# Patient Record
Sex: Female | Born: 1935 | Race: Black or African American | Hispanic: No | Marital: Single | Smoking: Never smoker
Health system: Southern US, Community
[De-identification: ages and names within clinical notes are randomized; demographics above are authoritative.]

## PROBLEM LIST (undated history)

## (undated) DIAGNOSIS — E7800 Pure hypercholesterolemia, unspecified: Secondary | ICD-10-CM

## (undated) DIAGNOSIS — G4730 Sleep apnea, unspecified: Secondary | ICD-10-CM

## (undated) DIAGNOSIS — I10 Essential (primary) hypertension: Secondary | ICD-10-CM

## (undated) DIAGNOSIS — K219 Gastro-esophageal reflux disease without esophagitis: Secondary | ICD-10-CM

## (undated) DIAGNOSIS — Z9289 Personal history of other medical treatment: Secondary | ICD-10-CM

## (undated) DIAGNOSIS — I8290 Acute embolism and thrombosis of unspecified vein: Secondary | ICD-10-CM

## (undated) HISTORY — PX: TUBAL LIGATION: SHX77

## (undated) HISTORY — DX: Personal history of other medical treatment: Z92.89

## (undated) HISTORY — DX: Sleep apnea, unspecified: G47.30

## (undated) HISTORY — PX: ABDOMINAL HYSTERECTOMY: SHX81

## (undated) HISTORY — PX: NM MYOVIEW LTD: HXRAD82

---

## 1997-10-13 ENCOUNTER — Ambulatory Visit (HOSPITAL_COMMUNITY): Admission: RE | Admit: 1997-10-13 | Discharge: 1997-10-13 | Payer: Self-pay | Admitting: Cardiology

## 1997-10-20 ENCOUNTER — Ambulatory Visit (HOSPITAL_COMMUNITY): Admission: RE | Admit: 1997-10-20 | Discharge: 1997-10-20 | Payer: Self-pay | Admitting: Cardiology

## 1999-06-13 ENCOUNTER — Ambulatory Visit (HOSPITAL_COMMUNITY): Admission: RE | Admit: 1999-06-13 | Discharge: 1999-06-13 | Payer: Self-pay

## 1999-09-04 ENCOUNTER — Ambulatory Visit (HOSPITAL_COMMUNITY): Admission: RE | Admit: 1999-09-04 | Discharge: 1999-09-04 | Payer: Self-pay | Admitting: Gastroenterology

## 1999-10-22 ENCOUNTER — Other Ambulatory Visit: Admission: RE | Admit: 1999-10-22 | Discharge: 1999-10-22 | Payer: Self-pay | Admitting: Internal Medicine

## 2003-01-27 ENCOUNTER — Ambulatory Visit (HOSPITAL_COMMUNITY): Admission: RE | Admit: 2003-01-27 | Discharge: 2003-01-27 | Payer: Self-pay | Admitting: Cardiology

## 2003-01-27 ENCOUNTER — Encounter: Payer: Self-pay | Admitting: Cardiology

## 2003-01-27 HISTORY — PX: CARDIAC CATHETERIZATION: SHX172

## 2005-01-16 ENCOUNTER — Ambulatory Visit (HOSPITAL_COMMUNITY): Admission: RE | Admit: 2005-01-16 | Discharge: 2005-01-16 | Payer: Self-pay | Admitting: Gastroenterology

## 2005-01-16 ENCOUNTER — Encounter (INDEPENDENT_AMBULATORY_CARE_PROVIDER_SITE_OTHER): Payer: Self-pay | Admitting: *Deleted

## 2006-07-19 ENCOUNTER — Emergency Department (HOSPITAL_COMMUNITY): Admission: EM | Admit: 2006-07-19 | Discharge: 2006-07-19 | Payer: Self-pay | Admitting: Family Medicine

## 2008-12-14 ENCOUNTER — Encounter: Admission: RE | Admit: 2008-12-14 | Discharge: 2008-12-14 | Payer: Self-pay | Admitting: Internal Medicine

## 2009-07-22 ENCOUNTER — Emergency Department (HOSPITAL_COMMUNITY): Admission: EM | Admit: 2009-07-22 | Discharge: 2009-07-22 | Payer: Self-pay | Admitting: Emergency Medicine

## 2009-09-05 DIAGNOSIS — Z9289 Personal history of other medical treatment: Secondary | ICD-10-CM

## 2009-09-05 HISTORY — DX: Personal history of other medical treatment: Z92.89

## 2009-10-16 ENCOUNTER — Ambulatory Visit (HOSPITAL_COMMUNITY): Admission: RE | Admit: 2009-10-16 | Discharge: 2009-10-16 | Payer: Self-pay | Admitting: Cardiovascular Disease

## 2009-11-28 ENCOUNTER — Encounter (INDEPENDENT_AMBULATORY_CARE_PROVIDER_SITE_OTHER): Payer: Self-pay | Admitting: Emergency Medicine

## 2009-11-28 ENCOUNTER — Observation Stay (HOSPITAL_COMMUNITY): Admission: EM | Admit: 2009-11-28 | Discharge: 2009-11-30 | Payer: Self-pay | Admitting: Emergency Medicine

## 2009-11-29 ENCOUNTER — Ambulatory Visit: Payer: Self-pay | Admitting: Vascular Surgery

## 2010-01-17 ENCOUNTER — Ambulatory Visit (HOSPITAL_COMMUNITY): Admission: RE | Admit: 2010-01-17 | Discharge: 2010-01-17 | Payer: Self-pay | Admitting: Cardiovascular Disease

## 2010-07-19 LAB — BLOOD GAS, ARTERIAL
Acid-Base Excess: 0.8 mmol/L (ref 0.0–2.0)
Bicarbonate: 24.3 mEq/L — ABNORMAL HIGH (ref 20.0–24.0)
Drawn by: 276051
FIO2: 0.21 %
TCO2: 25.4 mmol/L (ref 0–100)
pCO2 arterial: 35 mmHg (ref 35.0–45.0)
pO2, Arterial: 85.7 mmHg (ref 80.0–100.0)

## 2010-07-21 LAB — CBC
HCT: 32 % — ABNORMAL LOW (ref 36.0–46.0)
HCT: 33.6 % — ABNORMAL LOW (ref 36.0–46.0)
Hemoglobin: 10.8 g/dL — ABNORMAL LOW (ref 12.0–15.0)
Hemoglobin: 10.9 g/dL — ABNORMAL LOW (ref 12.0–15.0)
Hemoglobin: 11.6 g/dL — ABNORMAL LOW (ref 12.0–15.0)
MCH: 31.2 pg (ref 26.0–34.0)
MCHC: 34 g/dL (ref 30.0–36.0)
MCV: 90.4 fL (ref 78.0–100.0)
MCV: 91.9 fL (ref 78.0–100.0)
MCV: 93.2 fL (ref 78.0–100.0)
Platelets: 235 10*3/uL (ref 150–400)
RBC: 3.46 MIL/uL — ABNORMAL LOW (ref 3.87–5.11)
RBC: 3.72 MIL/uL — ABNORMAL LOW (ref 3.87–5.11)
RDW: 15.4 % (ref 11.5–15.5)
RDW: 15.5 % (ref 11.5–15.5)

## 2010-07-21 LAB — SEDIMENTATION RATE: Sed Rate: 65 mm/hr — ABNORMAL HIGH (ref 0–22)

## 2010-07-21 LAB — URINALYSIS, ROUTINE W REFLEX MICROSCOPIC
Bilirubin Urine: NEGATIVE
Hgb urine dipstick: NEGATIVE
Ketones, ur: NEGATIVE mg/dL
Nitrite: NEGATIVE
pH: 7.5 (ref 5.0–8.0)

## 2010-07-21 LAB — COMPREHENSIVE METABOLIC PANEL
CO2: 23 mEq/L (ref 19–32)
GFR calc Af Amer: >60 mL/min (ref >60)

## 2010-07-21 LAB — BASIC METABOLIC PANEL
BUN: 10 mg/dL (ref 6–23)
CO2: 25 mEq/L (ref 19–32)
Glucose, Bld: 99 mg/dL (ref 70–99)
Sodium: 139 mEq/L (ref 135–145)

## 2010-07-21 LAB — POCT CARDIAC MARKERS
CKMB, poc: <1 ng/mL — ABNORMAL LOW (ref 1.0–8.0)
Troponin i, poc: <0.05 ng/mL (ref 0.00–0.09)

## 2010-07-21 LAB — DIFFERENTIAL
Basophils Absolute: 0.1 10*3/uL (ref 0.0–0.1)
Monocytes Absolute: 0.7 10*3/uL (ref 0.1–1.0)

## 2010-07-30 LAB — COMPREHENSIVE METABOLIC PANEL
ALT: 18 U/L (ref 0–35)
Alkaline Phosphatase: 34 U/L — ABNORMAL LOW (ref 39–117)
CO2: 25 mEq/L (ref 19–32)
Chloride: 109 mEq/L (ref 96–112)
GFR calc Af Amer: >60 mL/min (ref >60)
GFR calc non Af Amer: 55 mL/min — ABNORMAL LOW (ref >60)
Total Bilirubin: 0.6 mg/dL (ref 0.3–1.2)

## 2010-07-30 LAB — DIFFERENTIAL
Basophils Relative: 0 % (ref 0–1)
Eosinophils Relative: 4 % (ref 0–5)
Lymphocytes Relative: 26 % (ref 12–46)
Lymphs Abs: 1.5 10*3/uL (ref 0.7–4.0)
Monocytes Absolute: 0.6 10*3/uL (ref 0.1–1.0)
Neutrophils Relative %: 60 % (ref 43–77)

## 2010-07-30 LAB — POCT CARDIAC MARKERS
CKMB, poc: 1.1 ng/mL (ref 1.0–8.0)
CKMB, poc: <1 ng/mL — ABNORMAL LOW (ref 1.0–8.0)
Myoglobin, poc: 89.9 ng/mL (ref 12–200)

## 2010-07-30 LAB — CBC
Hemoglobin: 11 g/dL — ABNORMAL LOW (ref 12.0–15.0)
MCHC: 33 g/dL (ref 30.0–36.0)
MCV: 93.3 fL (ref 78.0–100.0)
Platelets: 200 10*3/uL (ref 150–400)
RBC: 3.58 MIL/uL — ABNORMAL LOW (ref 3.87–5.11)

## 2010-09-21 NOTE — Procedures (Signed)
[place]  Patient:    [name], [name]                        MRN: [id number] Proc. Date: 09/04/99 Adm. Date:  [id number] Attending:  Charna Elizabeth CC:         Merlene Laughter. Renae Gloss, M.D.                           Procedure Report  DATE OF BIRTH: 1931-04-05  REFERRING PHYSICIAN: Merlene Laughter. Renae Gloss, M.D.  PROCEDURE: Colonoscopy.  ENDOSCOPIST: Anselmo Rod, M.D.  INSTRUMENTS USED: Olympus video colonoscope.  INDICATIONS FOR PROCEDURE: History of adenomatous polyps in a 77 year old black female; rule out recurrent polyps.  PREPROCEDURE PREPARATION: Informed consent was procured from the patient.  The patient fasted for eight hours prior to the procedure and prepped with a bottle of magnesium citrate and a gallon of NuLytely the night prior to the procedure.  PREPROCEDURE PHYSICAL:  VITAL SIGNS: The patient had stable vital signs.  NECK: Supple.  CHEST: Clear to auscultation.  CARDIAC: S1 and S2, regular.  ABDOMEN: Soft, with normal abdominal bowel sounds.  DESCRIPTION OF PROCEDURE: The patient was placed in the left lateral decubitus position and sedated with 50 mg of Demerol and 5 mg of Versed intravenously. Once the patient was adequately sedated, maintained on low-flow oxygen, continuous cardiac monitoring, the Olympus video colonoscope was advanced from the rectum to the cecum without difficulty.  There was left-sided diverticulosis.  No masses or polyps were seen.  The cecum, right colon, and transverse colon appeared healthy.  There was some residual stool in the colon and a very small lesion may have been missed.  IMPRESSION:  1. Left-sided diverticulosis.  2. No large masses or polyps seen.  3. Normal appearing cecum, right colon, and transverse colon.  RECOMMENDATIONS:  1. Patient advised to increase fluid and fiber in her diet.  2. Repeat colonoscopy recommended in five years or earlier if the patient     should  develop any symptoms in the interim.  3. Outpatient follow-up is advised as-needed. DD:  09/04/99 TD:  09/05/99 Job: [id number]

## 2010-09-21 NOTE — Op Note (Signed)
[name], [name]                 ACCOUNT NO.:  [id number]   MEDICAL RECORD NO.:  [id number]          PATIENT TYPE:  AMB   LOCATION:  ENDO                         FACILITY:  [facility]   PHYSICIAN:  Anselmo Rod, M.D.  DATE OF BIRTH:  1931-02-08   DATE OF PROCEDURE:  01/16/2005  DATE OF DISCHARGE:                                 OPERATIVE REPORT   PROCEDURE PERFORMED:  Colonoscopy with snare polypectomy x one and cold  biopsy x one.   ENDOSCOPIST:  Charna Elizabeth, M.D.   INSTRUMENT USED:  Olympus video colonoscope.   INDICATIONS FOR PROCEDURE:  The patient is a 76 year old African-American  female with a personal history of adenomatous polyps undergoing a repeat  colonoscopy to rule out recurrent polyps.   PREPROCEDURE PREPARATION:  Informed consent was procured from the patient.  The patient was fasted for eight hours prior to the procedure and prepped  with a bottle of magnesium citrate and a gallon of GoLYTELY the night prior  to the procedure.  The risks and benefits of the procedure including a 10%  miss rate for colon polyps or cancers was discussed with the patient as  well.   PREPROCEDURE PHYSICAL:  The patient had stable vital signs.  Neck supple.  Chest clear to auscultation.  S1 and S2 regular.  No murmurs, rubs or  gallops, rhonchi or wheezing.  Abdomen soft with normal bowel sounds.   DESCRIPTION OF PROCEDURE:  The patient was placed in left lateral decubitus  position and sedated with 70 mg of Demerol and 7.5 mg of Versed in slow  incremental doses.  Once the patient was adequately sedated and maintained  on low flow oxygen and continuous cardiac monitoring, the Olympus video  colonoscope was advanced from the rectum to the cecum.  The appendicular  orifice and ileocecal valve were clearly visualized and photographed.  A  small sessile polyp was biopsied (cold biopsy times one) from the mid right  colon.  Another larger flat polyp was removed by hot snare from the  distal  right colon.  They were both placed in the same specimen bottle.  There was  evidence of sigmoid diverticulosis.  Retroflexion in the rectum revealed  small internal hemorrhoids.  The patient tolerated the procedure well  without immediate complication.   IMPRESSION:  1.  Small nonbleeding internal hemorrhoids.  2.  Two polyps removed from the right colon, one by hot snare and one by      cold biopsy.  3.  Normal-appearing cecum and transverse colon.   RECOMMENDATIONS:  1.  Await pathology results.  2.  Repeat colonoscopy depending on pathology results.  3.  Brochures on diverticulosis have been given to the patient for her      education.  4.  Outpatient followup as need arises in the future.  If the patient has      any abnormal symptoms prior to her scheduled follow-up colonoscopy, she      is to contact the office immediately.      Anselmo Rod, M.D.  Electronically Signed  JNM/MEDQ  D:  01/16/2005  T:  01/16/2005  Job:  [id number]   cc:   Merlene Laughter. Renae Gloss, M.D.  [address]  [address]  [place]  [place] [address]  Fax: [phone]

## 2010-09-21 NOTE — Cardiovascular Report (Signed)
NAME:  [name], [name]                           ACCOUNT NO.:  [id number]   MEDICAL RECORD NO.:  [id number]                   PATIENT TYPE:  OIB   LOCATION:  2889                                 FACILITY:  [facility]   PHYSICIAN:  Madaline Savage, M.D.             DATE OF BIRTH:  16-Jun-1930   DATE OF PROCEDURE:  01/27/2003  DATE OF DISCHARGE:                              CARDIAC CATHETERIZATION   PROCEDURES PERFORMED:  1. Combined left heart catheterization.     a. Selective coronary angiography by Judkins technique.     b. Retrograde left heart catheterization.     c. Left ventricular angiography.  2. Successful right femoral arteriotomy with the Angio-Seal device.   COMPLICATIONS:  None.   ENTRY SITE:  Right femoral.   DYE USED:  Omnipaque.   PATIENT PROFILE:  [name] is a delightful 77 year old recently widowed  African-American woman who has had hypertension for some time and recently  has had mid chest tightness mostly at rest and not with exertion.  A recent  Cardiolite stress test showed anterior wall thinning and mild anterolateral  and lateral wall ischemia toward the apex with left ventricular ejection  fraction of 79.  We discussed the results with the patient and she agreed to  an outpatient cardiac catheterization.   RESULTS:   PRESSURES:  The left ventricular  pressure was 190/90 with a mean pressure  of 130.  Left ventricular pressure was 190/10, end-diastolic pressure 22.   ANGIOGRAPHIC RESULTS:  1. The left main coronary artery was normal.  2. The left anterior descending coronary artery gave rise to one septal     perforator branch and a medium size diagonal branch that arose near the     first septal perforator branch.  3. The left circumflex is nondominant giving rise to one intermediate ramus     branch and then a pair of posterolateral branches.  4. The right coronary artery was a dominant vessel with no lesions seen.  5. Left ventricle showed good  contractility.  Ejection fraction estimate of     60%.  No wall motion abnormality seen.  No mitral regurgitation.   FINAL DIAGNOSES:  1. Angiographically patent and normal coronary arteries.  2. Normal left ventricular systolic function.  3. Normal abdominal aorta.  4. Normal renal arteries.   ADDENDUM:  I had failed to mention that the patient also had an abdominal  aortogram showing normal abdominal aorta and also normal renal arteries.  This was done because the patient has a history of hypertension.                                                 Madaline Savage, M.D.    WHG/MEDQ  D:  01/27/2003  T:  01/27/2003  Job:  [id number]   cc:   Merlene Laughter. Renae Gloss, M.D.  [address]  [address]  [place]  [place] [address]  Fax: [phone]

## 2011-04-15 ENCOUNTER — Encounter (HOSPITAL_BASED_OUTPATIENT_CLINIC_OR_DEPARTMENT_OTHER): Payer: No Typology Code available for payment source | Attending: Internal Medicine

## 2011-04-15 DIAGNOSIS — E785 Hyperlipidemia, unspecified: Secondary | ICD-10-CM | POA: Insufficient documentation

## 2011-04-15 DIAGNOSIS — Z79899 Other long term (current) drug therapy: Secondary | ICD-10-CM | POA: Insufficient documentation

## 2011-04-15 DIAGNOSIS — Z7982 Long term (current) use of aspirin: Secondary | ICD-10-CM | POA: Insufficient documentation

## 2011-04-15 DIAGNOSIS — L97309 Non-pressure chronic ulcer of unspecified ankle with unspecified severity: Secondary | ICD-10-CM | POA: Insufficient documentation

## 2011-04-15 DIAGNOSIS — I872 Venous insufficiency (chronic) (peripheral): Secondary | ICD-10-CM | POA: Insufficient documentation

## 2011-04-15 DIAGNOSIS — I10 Essential (primary) hypertension: Secondary | ICD-10-CM | POA: Insufficient documentation

## 2011-04-16 NOTE — Progress Notes (Unsigned)
[facility]  NAME:  [name], [name]                 ACCOUNT NO.:  [id number]  MEDICAL RECORD NO.:  [id number]      DATE OF BIRTH:  08-Sep-1930  PHYSICIAN:  Jonelle Sports. Sevier, M.D.       VISIT DATE:                                  OFFICE VISIT   HISTORY:  This 77 year old black female is seen for evaluation of an ulcer of relatively short duration in the "gaiter" area of her right ankle.  The patient has a history of thrombophlebitis in that right lower extremity a number of years ago and since that time, he has developed really bilateral staining consistent with stasis dermatitis, somewhat worse in the right lower extremity than in the left.  She had not had prior ulcerations, however, until late September of this year when she developed an open ulceration in the medial supramalleolar area of her right ankle, which has persisted to the present with progressive enlargement and sort of a fibrous nondraining, nonodorous base.  There has been no pain.  There has been no surrounding erythema.  No drainage or odor and no systemic symptoms.  She was referred here for our evaluation and advice.  PAST MEDICAL HISTORY:  Surgically noted for total abdominal hysterectomy done a number of years ago.  This was done for fibroids and apparently her ovaries were spared.  She has had no other surgery.  She has had hospitalizations for diverticulitis and thrombophlebitis.  ALLERGIES:  She has no known medicinal allergies.  MEDICATIONS:  Her regular medications include: 1. Bactrim DS b.i.d. 2. Coenzyme Q10 of 200 mg daily. 3. Diovan HCT 160/25 one daily. 4. Zantac 150 mg b.i.d. 5. Vicodin 5/500 one q.4-6 h. as needed. 6. Aspirin 81 mg daily.  PERSONAL HISTORY:  The patient is widowed, has a son who lives with her. She does not smoke, drink alcohol, or use any recreational drugs.  She is not currently employed.  She is able to manage her daily needs without  assistance.  FAMILY HISTORY:  Not available.  Apparently is noted for longevity.  REVIEW OF SYSTEMS:  The patient has no history of stroke or heart disease.  She is hypertensive and also has hyperlipidemia.  She does not have diabetes; has no awareness of any kidney disorder; has never had malignancy, radiation, or chemotherapy.  She has no bleeding disorders, no anemia, and no thyroid disease or recent unexplained weight change.  PHYSICAL EXAMINATION:  VITAL SIGNS:  Blood pressure is 121/64, pulse 72 and regular, respirations 18, temperature 97.9. GENERAL:  This is a pleasant, alert, and cooperative elderly black female, in no immediate distress. SKIN:  Warm and dry with good texture and turgor. HEENT:  Her mucous membranes are moist and with good color. HEART:  Regular with no murmur or gallop. LUNGS:  Clear to auscultation. ABDOMEN:  Palpated only but no tenderness, apparent masses, or organomegaly. EXTREMITIES:  2+ edema on the right, 1+ on the left.  Chronic stasis changes extending to the high tibial areas bilaterally, slightly worse on the right than on the left.  Her pulses are everywhere palpable and appear quite adequate.  Her toenails are overgrown and curling. In the supramalleolar area, on the medial aspect of the right lower extremity, there is an  open ulcer measuring 3.4 x 3.2 cm with a very fibrous and adherent slough in its base.  IMPRESSION:  Chronic venous insufficiency, importantly secondary to prior thrombophlebitis, now with stasis ulceration, right medial supramalleolar area.  DISPOSITION:  The patient has an appointment already scheduled to see Cardiovascular Clinic for appropriate venous studies and hopefully consideration of obliteration.  We pointed out that we likely would be in agreement with such recommendation, and we leave it to their judgment as to whether or not she wants to wait until her wound is healed.  The wound was debrided using a curette  and interestingly we have more success in removing part of this fibrinous exudate than I would have guessed.  Nonetheless, a considerable amount remains.  The wound is then treated with an application of hydrogel and Santyl covered with a hydrocolloid and that extremity is placed in a Kerlix/Coban wrap.  She will ask the Cardiovascular Clinic to share their findings with [place], and we have asked that they do arterial studies as well as venous.  The patient is given the name of Dr. Larey Dresser, a podiatrist, to consult for care of her nails now and for routine ongoing podiatric care.  Followup visit will be here in 1 week.          ______________________________ Jonelle Sports. Cheryll Cockayne, M.D.     RES/MEDQ  D:  04/15/2011  T:  04/16/2011  Job:  [id number]

## 2011-05-13 ENCOUNTER — Encounter (HOSPITAL_BASED_OUTPATIENT_CLINIC_OR_DEPARTMENT_OTHER): Payer: No Typology Code available for payment source | Attending: Internal Medicine

## 2011-05-13 DIAGNOSIS — I10 Essential (primary) hypertension: Secondary | ICD-10-CM | POA: Insufficient documentation

## 2011-05-13 DIAGNOSIS — Z79899 Other long term (current) drug therapy: Secondary | ICD-10-CM | POA: Insufficient documentation

## 2011-05-13 DIAGNOSIS — M899 Disorder of bone, unspecified: Secondary | ICD-10-CM | POA: Insufficient documentation

## 2011-05-13 DIAGNOSIS — L97309 Non-pressure chronic ulcer of unspecified ankle with unspecified severity: Secondary | ICD-10-CM | POA: Insufficient documentation

## 2011-05-13 DIAGNOSIS — I872 Venous insufficiency (chronic) (peripheral): Secondary | ICD-10-CM | POA: Insufficient documentation

## 2011-05-20 ENCOUNTER — Encounter (HOSPITAL_BASED_OUTPATIENT_CLINIC_OR_DEPARTMENT_OTHER): Payer: Medicare Other

## 2011-06-10 ENCOUNTER — Encounter (HOSPITAL_BASED_OUTPATIENT_CLINIC_OR_DEPARTMENT_OTHER): Payer: No Typology Code available for payment source | Attending: Internal Medicine

## 2011-06-10 DIAGNOSIS — Z90710 Acquired absence of both cervix and uterus: Secondary | ICD-10-CM | POA: Insufficient documentation

## 2011-06-10 DIAGNOSIS — M899 Disorder of bone, unspecified: Secondary | ICD-10-CM | POA: Insufficient documentation

## 2011-06-10 DIAGNOSIS — I872 Venous insufficiency (chronic) (peripheral): Secondary | ICD-10-CM | POA: Insufficient documentation

## 2011-06-10 DIAGNOSIS — Z79899 Other long term (current) drug therapy: Secondary | ICD-10-CM | POA: Insufficient documentation

## 2011-06-10 DIAGNOSIS — Z7982 Long term (current) use of aspirin: Secondary | ICD-10-CM | POA: Insufficient documentation

## 2011-06-10 DIAGNOSIS — L97809 Non-pressure chronic ulcer of other part of unspecified lower leg with unspecified severity: Secondary | ICD-10-CM | POA: Insufficient documentation

## 2011-06-10 DIAGNOSIS — I10 Essential (primary) hypertension: Secondary | ICD-10-CM | POA: Insufficient documentation

## 2011-06-10 DIAGNOSIS — E785 Hyperlipidemia, unspecified: Secondary | ICD-10-CM | POA: Insufficient documentation

## 2011-07-08 ENCOUNTER — Encounter (HOSPITAL_BASED_OUTPATIENT_CLINIC_OR_DEPARTMENT_OTHER): Payer: No Typology Code available for payment source | Attending: Internal Medicine

## 2011-07-08 DIAGNOSIS — E785 Hyperlipidemia, unspecified: Secondary | ICD-10-CM | POA: Insufficient documentation

## 2011-07-08 DIAGNOSIS — L97809 Non-pressure chronic ulcer of other part of unspecified lower leg with unspecified severity: Secondary | ICD-10-CM | POA: Insufficient documentation

## 2011-07-08 DIAGNOSIS — M899 Disorder of bone, unspecified: Secondary | ICD-10-CM | POA: Insufficient documentation

## 2011-07-08 DIAGNOSIS — I10 Essential (primary) hypertension: Secondary | ICD-10-CM | POA: Insufficient documentation

## 2011-07-08 DIAGNOSIS — I872 Venous insufficiency (chronic) (peripheral): Secondary | ICD-10-CM | POA: Insufficient documentation

## 2011-07-08 DIAGNOSIS — Z79899 Other long term (current) drug therapy: Secondary | ICD-10-CM | POA: Insufficient documentation

## 2011-07-08 DIAGNOSIS — Z7982 Long term (current) use of aspirin: Secondary | ICD-10-CM | POA: Insufficient documentation

## 2011-07-08 DIAGNOSIS — Z90710 Acquired absence of both cervix and uterus: Secondary | ICD-10-CM | POA: Insufficient documentation

## 2011-08-05 ENCOUNTER — Encounter (HOSPITAL_BASED_OUTPATIENT_CLINIC_OR_DEPARTMENT_OTHER): Payer: No Typology Code available for payment source | Attending: Internal Medicine

## 2011-08-05 DIAGNOSIS — I872 Venous insufficiency (chronic) (peripheral): Secondary | ICD-10-CM | POA: Insufficient documentation

## 2011-08-05 DIAGNOSIS — L97809 Non-pressure chronic ulcer of other part of unspecified lower leg with unspecified severity: Secondary | ICD-10-CM | POA: Insufficient documentation

## 2011-09-09 ENCOUNTER — Encounter (HOSPITAL_BASED_OUTPATIENT_CLINIC_OR_DEPARTMENT_OTHER): Payer: Medicare Other | Attending: Internal Medicine

## 2011-09-09 DIAGNOSIS — L97809 Non-pressure chronic ulcer of other part of unspecified lower leg with unspecified severity: Secondary | ICD-10-CM | POA: Insufficient documentation

## 2011-09-09 DIAGNOSIS — I872 Venous insufficiency (chronic) (peripheral): Secondary | ICD-10-CM | POA: Insufficient documentation

## 2011-11-27 ENCOUNTER — Encounter (HOSPITAL_BASED_OUTPATIENT_CLINIC_OR_DEPARTMENT_OTHER): Payer: No Typology Code available for payment source | Attending: General Surgery

## 2011-11-27 DIAGNOSIS — I872 Venous insufficiency (chronic) (peripheral): Secondary | ICD-10-CM | POA: Insufficient documentation

## 2011-11-27 DIAGNOSIS — I10 Essential (primary) hypertension: Secondary | ICD-10-CM | POA: Insufficient documentation

## 2011-11-27 DIAGNOSIS — I83009 Varicose veins of unspecified lower extremity with ulcer of unspecified site: Secondary | ICD-10-CM | POA: Insufficient documentation

## 2011-11-27 DIAGNOSIS — Z79899 Other long term (current) drug therapy: Secondary | ICD-10-CM | POA: Insufficient documentation

## 2011-11-27 DIAGNOSIS — E785 Hyperlipidemia, unspecified: Secondary | ICD-10-CM | POA: Insufficient documentation

## 2011-11-27 DIAGNOSIS — L97909 Non-pressure chronic ulcer of unspecified part of unspecified lower leg with unspecified severity: Secondary | ICD-10-CM | POA: Insufficient documentation

## 2011-11-27 NOTE — H&P (Signed)
[name], [name]                 ACCOUNT NO.:  [id number]  MEDICAL RECORD NO.:  [id number]  LOCATION:  FOOT                         FACILITY:  [facility]  PHYSICIAN:  Joanne Gavel, M.D.        DATE OF BIRTH:  1930/11/11  DATE OF ADMISSION:  11/27/2011 DATE OF DISCHARGE:                             HISTORY & PHYSICAL   CHIEF COMPLAINT:  Wound, right leg.  HISTORY OF PRESENT ILLNESS:  This is a 78 year old female with varicose and venous insufficiency.  She had a sizable ulceration at the right medial malleolus, which was treated and healed out on August 12, 2011. This remained healed for 2-3 months, but approximately 2 weeks ago, she noticed a spontaneous breakdown in the same location.  There has been no fever, chills, or sweating spells.  The wound is not particularly painful.  There has been no trauma.  PAST MEDICAL HISTORY:  She has had diverticulosis, fibroid uterus, hypertension, hyperlipidemia, thrombophlebitis, and history of venous insufficiency.  PAST SURGICAL HISTORY:  She has had a skin graft in the past and hysterectomy.  SOCIAL HISTORY:  Cigarettes none.  Alcohol none.  MEDICATIONS:  Diovan and Zantac, several different enzymes and nutritional supplements and vitamins.  ALLERGIES:  None.  REVIEW OF SYSTEMS:  Essentially as above.  PHYSICAL EXAMINATION:  VITAL SIGNS: Temperature 98.2, pulse 60, respirations 18, blood pressure 138/79. GENERAL:  Well developed, well nourished, in no distress. HEAD, EYES, EARS, THROAT: Normal. CHEST:  Clear. HEART:  Regular rhythm. ABDOMEN: Not examined. EXTREMITIES:  Peripheral pulses are palpable.  There is a 4.0 x 4.6 x 4.6 cm full-thickness wound above the right medial malleolus.  This is approximately 0.1 cm in depth.  The base is covered with a thick slough, which is curetted off.  The base does remain shaggy, however.  IMPRESSION:  Recurrent varicose-venous ulcer.  PLAN OF TREATMENT:  Start with Santyl and Hydrogel, Coban 2.   We will see her in 7 days.     Joanne Gavel, M.D.     RA/MEDQ  D:  11/27/2011  T:  11/27/2011  Job:  [id number]

## 2011-12-11 ENCOUNTER — Encounter (HOSPITAL_BASED_OUTPATIENT_CLINIC_OR_DEPARTMENT_OTHER): Payer: No Typology Code available for payment source | Attending: General Surgery

## 2011-12-11 DIAGNOSIS — L97809 Non-pressure chronic ulcer of other part of unspecified lower leg with unspecified severity: Secondary | ICD-10-CM | POA: Insufficient documentation

## 2011-12-11 DIAGNOSIS — I872 Venous insufficiency (chronic) (peripheral): Secondary | ICD-10-CM | POA: Insufficient documentation

## 2012-01-01 ENCOUNTER — Encounter (HOSPITAL_BASED_OUTPATIENT_CLINIC_OR_DEPARTMENT_OTHER): Payer: Medicare Other

## 2012-01-08 ENCOUNTER — Encounter (HOSPITAL_BASED_OUTPATIENT_CLINIC_OR_DEPARTMENT_OTHER): Payer: No Typology Code available for payment source | Attending: General Surgery

## 2012-01-08 DIAGNOSIS — L97309 Non-pressure chronic ulcer of unspecified ankle with unspecified severity: Secondary | ICD-10-CM | POA: Insufficient documentation

## 2012-01-08 DIAGNOSIS — I872 Venous insufficiency (chronic) (peripheral): Secondary | ICD-10-CM | POA: Insufficient documentation

## 2012-02-05 ENCOUNTER — Encounter (HOSPITAL_BASED_OUTPATIENT_CLINIC_OR_DEPARTMENT_OTHER): Payer: No Typology Code available for payment source | Attending: General Surgery

## 2012-02-05 DIAGNOSIS — L97309 Non-pressure chronic ulcer of unspecified ankle with unspecified severity: Secondary | ICD-10-CM | POA: Insufficient documentation

## 2012-03-11 ENCOUNTER — Encounter (HOSPITAL_BASED_OUTPATIENT_CLINIC_OR_DEPARTMENT_OTHER): Payer: No Typology Code available for payment source | Attending: General Surgery

## 2012-03-11 DIAGNOSIS — L97909 Non-pressure chronic ulcer of unspecified part of unspecified lower leg with unspecified severity: Secondary | ICD-10-CM | POA: Insufficient documentation

## 2012-03-11 DIAGNOSIS — I87319 Chronic venous hypertension (idiopathic) with ulcer of unspecified lower extremity: Secondary | ICD-10-CM | POA: Insufficient documentation

## 2012-04-08 ENCOUNTER — Encounter (HOSPITAL_BASED_OUTPATIENT_CLINIC_OR_DEPARTMENT_OTHER): Payer: Medicare Other

## 2012-04-08 ENCOUNTER — Encounter (HOSPITAL_BASED_OUTPATIENT_CLINIC_OR_DEPARTMENT_OTHER): Payer: No Typology Code available for payment source | Attending: General Surgery

## 2012-04-08 DIAGNOSIS — L97909 Non-pressure chronic ulcer of unspecified part of unspecified lower leg with unspecified severity: Secondary | ICD-10-CM | POA: Insufficient documentation

## 2012-05-13 ENCOUNTER — Encounter (HOSPITAL_BASED_OUTPATIENT_CLINIC_OR_DEPARTMENT_OTHER): Payer: Medicare Other | Attending: General Surgery

## 2012-05-13 DIAGNOSIS — L97909 Non-pressure chronic ulcer of unspecified part of unspecified lower leg with unspecified severity: Secondary | ICD-10-CM | POA: Insufficient documentation

## 2012-06-10 ENCOUNTER — Encounter (HOSPITAL_BASED_OUTPATIENT_CLINIC_OR_DEPARTMENT_OTHER): Payer: Medicare Other | Attending: General Surgery

## 2012-06-10 DIAGNOSIS — I87319 Chronic venous hypertension (idiopathic) with ulcer of unspecified lower extremity: Secondary | ICD-10-CM | POA: Insufficient documentation

## 2012-06-10 DIAGNOSIS — L97909 Non-pressure chronic ulcer of unspecified part of unspecified lower leg with unspecified severity: Secondary | ICD-10-CM | POA: Insufficient documentation

## 2012-06-17 ENCOUNTER — Encounter (HOSPITAL_BASED_OUTPATIENT_CLINIC_OR_DEPARTMENT_OTHER): Payer: No Typology Code available for payment source

## 2012-07-08 ENCOUNTER — Encounter (HOSPITAL_BASED_OUTPATIENT_CLINIC_OR_DEPARTMENT_OTHER): Payer: Medicare Other | Attending: General Surgery

## 2012-07-08 DIAGNOSIS — L97809 Non-pressure chronic ulcer of other part of unspecified lower leg with unspecified severity: Secondary | ICD-10-CM | POA: Insufficient documentation

## 2012-07-08 DIAGNOSIS — I872 Venous insufficiency (chronic) (peripheral): Secondary | ICD-10-CM | POA: Insufficient documentation

## 2012-07-27 ENCOUNTER — Other Ambulatory Visit (HOSPITAL_COMMUNITY): Payer: Self-pay | Admitting: Cardiovascular Disease

## 2012-07-29 ENCOUNTER — Other Ambulatory Visit (HOSPITAL_COMMUNITY): Payer: Self-pay | Admitting: Cardiovascular Disease

## 2012-07-30 ENCOUNTER — Other Ambulatory Visit (HOSPITAL_COMMUNITY): Payer: Self-pay | Admitting: Cardiology

## 2012-08-05 ENCOUNTER — Encounter (HOSPITAL_BASED_OUTPATIENT_CLINIC_OR_DEPARTMENT_OTHER): Payer: No Typology Code available for payment source | Attending: General Surgery

## 2012-08-05 DIAGNOSIS — I87309 Chronic venous hypertension (idiopathic) without complications of unspecified lower extremity: Secondary | ICD-10-CM | POA: Insufficient documentation

## 2012-08-05 DIAGNOSIS — L97809 Non-pressure chronic ulcer of other part of unspecified lower leg with unspecified severity: Secondary | ICD-10-CM | POA: Insufficient documentation

## 2012-08-05 DIAGNOSIS — I872 Venous insufficiency (chronic) (peripheral): Secondary | ICD-10-CM | POA: Insufficient documentation

## 2012-08-14 ENCOUNTER — Ambulatory Visit (HOSPITAL_COMMUNITY)
Admission: RE | Admit: 2012-08-14 | Discharge: 2012-08-14 | Disposition: A | Discharge status: Home / Self Care | Payer: Medicare Other | Source: Ambulatory Visit | Attending: Cardiovascular Disease | Admitting: Cardiovascular Disease

## 2012-08-14 DIAGNOSIS — L97109 Non-pressure chronic ulcer of unspecified thigh with unspecified severity: Secondary | ICD-10-CM | POA: Insufficient documentation

## 2012-08-14 NOTE — Progress Notes (Signed)
Arterial Duplex Lower Ext. Completed. [name] D  

## 2012-09-22 ENCOUNTER — Other Ambulatory Visit: Payer: Self-pay | Admitting: Internal Medicine

## 2012-09-22 ENCOUNTER — Other Ambulatory Visit: Payer: Self-pay | Admitting: Nurse Practitioner

## 2012-09-22 DIAGNOSIS — M79662 Pain in left lower leg: Secondary | ICD-10-CM

## 2012-09-23 ENCOUNTER — Ambulatory Visit
Admission: RE | Admit: 2012-09-23 | Discharge: 2012-09-23 | Disposition: A | Discharge status: Home / Self Care | Payer: Medicare Other | Source: Ambulatory Visit | Attending: Internal Medicine | Admitting: Internal Medicine

## 2012-09-23 DIAGNOSIS — M79662 Pain in left lower leg: Secondary | ICD-10-CM

## 2014-06-13 ENCOUNTER — Emergency Department (HOSPITAL_COMMUNITY)
Admission: EM | Admit: 2014-06-13 | Discharge: 2014-06-13 | Disposition: A | Discharge status: Home / Self Care | Payer: Medicare Other | Attending: Emergency Medicine | Admitting: Emergency Medicine

## 2014-06-13 ENCOUNTER — Encounter (HOSPITAL_COMMUNITY): Payer: Self-pay | Admitting: Emergency Medicine

## 2014-06-13 DIAGNOSIS — I10 Essential (primary) hypertension: Secondary | ICD-10-CM | POA: Diagnosis not present

## 2014-06-13 DIAGNOSIS — E780 Pure hypercholesterolemia: Secondary | ICD-10-CM | POA: Insufficient documentation

## 2014-06-13 DIAGNOSIS — Z79899 Other long term (current) drug therapy: Secondary | ICD-10-CM | POA: Diagnosis not present

## 2014-06-13 DIAGNOSIS — K219 Gastro-esophageal reflux disease without esophagitis: Secondary | ICD-10-CM | POA: Diagnosis not present

## 2014-06-13 DIAGNOSIS — M25561 Pain in right knee: Secondary | ICD-10-CM | POA: Insufficient documentation

## 2014-06-13 DIAGNOSIS — M79604 Pain in right leg: Secondary | ICD-10-CM | POA: Diagnosis present

## 2014-06-13 DIAGNOSIS — Z7982 Long term (current) use of aspirin: Secondary | ICD-10-CM | POA: Diagnosis not present

## 2014-06-13 DIAGNOSIS — M25562 Pain in left knee: Secondary | ICD-10-CM | POA: Diagnosis not present

## 2014-06-13 HISTORY — DX: Acute embolism and thrombosis of unspecified vein: I82.90

## 2014-06-13 HISTORY — DX: Gastro-esophageal reflux disease without esophagitis: K21.9

## 2014-06-13 HISTORY — DX: Essential (primary) hypertension: I10

## 2014-06-13 HISTORY — DX: Pure hypercholesterolemia, unspecified: E78.00

## 2014-06-13 MED ORDER — OXYCODONE HCL 5 MG PO TABS
5.0000 mg | ORAL_TABLET | ORAL | Status: AC
  Administered 2014-06-13: 5 mg via ORAL
  Filled 2014-06-13: qty 1

## 2014-06-13 MED ORDER — OXYCODONE-ACETAMINOPHEN 5-325 MG PO TABS
1.0000 | ORAL_TABLET | Freq: Four times a day (QID) | ORAL | Status: DC | PRN
Start: 2014-06-13 — End: 2015-11-02

## 2014-06-13 MED ORDER — ACETAMINOPHEN 325 MG PO TABS
650.0000 mg | ORAL_TABLET | Freq: Once | ORAL | Status: AC
  Administered 2014-06-13: 650 mg via ORAL
  Filled 2014-06-13: qty 2

## 2014-06-13 NOTE — ED Provider Notes (Signed)
CSN: [id number]     Arrival date & time 06/13/14  1250 History   First MD Initiated Contact with Patient 06/13/14 1535     Chief Complaint  Patient presents with  . Leg Pain     (Consider location/radiation/quality/duration/timing/severity/associated sxs/prior Treatment) Patient is a 81 y.o. female presenting with leg pain. The history is provided by the patient.  Leg Pain Location:  Knee Time since incident:  3 days Injury: no   Knee location:  L knee and R knee Pain details:    Quality:  Aching   Radiates to:  R leg and L leg   Severity:  Moderate   Onset quality:  Gradual   Duration:  3 days   Timing:  Constant   Progression:  Worsening Chronicity:  Chronic Dislocation: no   Foreign body present:  No foreign bodies Prior injury to area:  No Relieved by:  Nothing Worsened by:  Activity and bearing weight Ineffective treatments:  NSAIDs Associated symptoms: no back pain, no fatigue, no fever and no neck pain     Past Medical History  Diagnosis Date  . Blood clot in vein   . Hypertension   . High cholesterol   . GERD (gastroesophageal reflux disease)    Past Surgical History  Procedure Laterality Date  . Tubal ligation     History reviewed. No pertinent family history. History  Substance Use Topics  . Smoking status: Not on file  . Smokeless tobacco: Not on file  . Alcohol Use: Not on file   OB History    No data available     Review of Systems  Constitutional: Negative for fever and fatigue.  HENT: Negative for congestion and drooling.   Eyes: Negative for pain.  Respiratory: Negative for cough and shortness of breath.   Cardiovascular: Negative for chest pain.  Gastrointestinal: Negative for nausea, vomiting, abdominal pain and diarrhea.  Genitourinary: Negative for dysuria and hematuria.  Musculoskeletal: Negative for back pain, gait problem and neck pain.  Skin: Negative for color change.  Neurological: Negative for dizziness and headaches.   Hematological: Negative for adenopathy.  Psychiatric/Behavioral: Negative for behavioral problems.  All other systems reviewed and are negative.     Allergies  Review of patient's allergies indicates no known allergies.  Home Medications   Prior to Admission medications   Medication Sig Start Date End Date Taking? Authorizing Provider  acetaminophen (TYLENOL) 500 MG tablet Take 1,000 mg by mouth every 6 (six) hours as needed (for pain).   Yes Historical Provider, MD  aspirin EC 81 MG tablet Take 81 mg by mouth daily with breakfast.   Yes Historical Provider, MD  Multiple Vitamins-Minerals (CENTRUM SILVER ADULT 50+) TABS Take 1 tablet by mouth daily with breakfast.   Yes Historical Provider, MD  pravastatin (PRAVACHOL) 40 MG tablet Take 40 mg by mouth daily with breakfast.   Yes Historical Provider, MD  ranitidine (ZANTAC) 150 MG tablet Take 150 mg by mouth daily with breakfast.   Yes Historical Provider, MD  valsartan-hydrochlorothiazide (DIOVAN-HCT) 160-25 MG per tablet Take 1 tablet by mouth daily with breakfast.   Yes Historical Provider, MD   BP 125/53 mmHg  Pulse 68  Temp(Src) 98.3 F (36.8 C) (Oral)  Resp 18  SpO2 97% Physical Exam  Constitutional: She is oriented to person, place, and time. She appears well-developed and well-nourished.  HENT:  Head: Normocephalic.  Mouth/Throat: Oropharynx is clear and moist. No oropharyngeal exudate.  Eyes: Conjunctivae and EOM are normal. Pupils are  equal, round, and reactive to light.  Neck: Normal range of motion. Neck supple.  Cardiovascular: Normal rate, regular rhythm, normal heart sounds and intact distal pulses.  Exam reveals no gallop and no friction rub.   No murmur heard. Pulmonary/Chest: Effort normal and breath sounds normal. No respiratory distress. She has no wheezes.  Abdominal: Soft. Bowel sounds are normal. There is no tenderness. There is no rebound and no guarding.  Musculoskeletal: Normal range of motion. She  exhibits tenderness. She exhibits no edema.  Diffuse nonspecific tenderness to palpation of bilateral anterior knees. Mildly limited range of motion of the knees bilaterally due to pain.  Superficially the knees appear normal without evidence of erythema or swelling. They appear symmetric.  2+ distal DP pulses in bilateral lower extremities.  No tenderness to palpation of the hips bilaterally.  Neurological: She is alert and oriented to person, place, and time.  Skin: Skin is warm and dry.  Psychiatric: She has a normal mood and affect. Her behavior is normal.  Nursing note and vitals reviewed.   ED Course  Procedures (including critical care time) Labs Review Labs Reviewed - No data to display  Imaging Review No results found.   EKG Interpretation None      MDM   Final diagnoses:  Bilateral knee pain    4:11 PM 81 y.o. female who presents with bilateral knee pain which worsened over the last 2-3 days. She denies any injuries. She states that she has a long history of chronic knee pain. She also states that she has a history of blood clots in the past but is not on anticoagulation. She is afebrile and vital signs are unremarkable here. She has no tenderness to palpation of her thighs or calves. Her pain is localized around the knees bilaterally and is worse with range of motion of the knees. I suspect this is acute worsening of chronic osteoarthritis. She is tried over-the-counter medications without significant relief. Will get pain control and ambulate her here. I do not think this is a blood clot. I suspect she needs a formal evaluation by an orthopedist.  5:58 PM: Pain controlled and patient ambulatory. She believes she has seen a physician with [facility] orth[facility] and will have her follow-up with their group for evaluation. I have discussed the diagnosis/risks/treatment options with the patient and family and believe the pt to be eligible for discharge home to follow-up  with [facility]. We also discussed returning to the ED immediately if new or worsening sx occur. We discussed the sx which are most concerning (e.g., worsening pain, fever) that necessitate immediate return. Medications administered to the patient during their visit and any new prescriptions provided to the patient are listed below.  Medications given during this visit Medications  acetaminophen (TYLENOL) tablet 650 mg (650 mg Oral Given 06/13/14 1638)  oxyCODONE (Oxy IR/ROXICODONE) immediate release tablet 5 mg (5 mg Oral Given 06/13/14 1638)    New Prescriptions   OXYCODONE-ACETAMINOPHEN (PERCOCET) 5-325 MG PER TABLET    Take 1 tablet by mouth every 6 (six) hours as needed for moderate pain.     Purvis Sheffield, MD 06/13/14 1759

## 2014-06-13 NOTE — ED Notes (Signed)
Bilateral lower leg pain going on for the past 3 days. 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

## 2014-06-13 NOTE — ED Notes (Signed)
Bed: [facility] Expected date:  Expected time:  Means of arrival:  Comments: Elderly leg pain

## 2014-06-13 NOTE — ED Notes (Signed)
Pt was able to ambulate with some assistance from staff to steady herself. She complains of a great deal of pain when bending her knees and feels "unsure of her knees".

## 2014-09-01 ENCOUNTER — Other Ambulatory Visit: Payer: Self-pay | Admitting: Internal Medicine

## 2014-09-01 DIAGNOSIS — M79662 Pain in left lower leg: Secondary | ICD-10-CM

## 2014-09-02 ENCOUNTER — Other Ambulatory Visit: Payer: Self-pay | Admitting: Internal Medicine

## 2014-09-02 ENCOUNTER — Ambulatory Visit
Admission: RE | Admit: 2014-09-02 | Discharge: 2014-09-02 | Disposition: A | Discharge status: Home / Self Care | Payer: Medicare Other | Source: Ambulatory Visit | Attending: Internal Medicine | Admitting: Internal Medicine

## 2014-09-02 DIAGNOSIS — M79662 Pain in left lower leg: Secondary | ICD-10-CM

## 2014-11-15 ENCOUNTER — Encounter: Payer: Self-pay | Admitting: *Deleted

## 2014-12-15 ENCOUNTER — Emergency Department (HOSPITAL_BASED_OUTPATIENT_CLINIC_OR_DEPARTMENT_OTHER): Admit: 2014-12-15 | Discharge: 2014-12-15 | Disposition: A | Discharge status: Home / Self Care | Payer: Medicare Other

## 2014-12-15 ENCOUNTER — Emergency Department (HOSPITAL_COMMUNITY)
Admission: EM | Admit: 2014-12-15 | Discharge: 2014-12-15 | Disposition: A | Discharge status: Home / Self Care | Payer: Medicare Other | Attending: Emergency Medicine | Admitting: Emergency Medicine

## 2014-12-15 ENCOUNTER — Encounter (HOSPITAL_COMMUNITY): Payer: Self-pay

## 2014-12-15 DIAGNOSIS — Z8669 Personal history of other diseases of the nervous system and sense organs: Secondary | ICD-10-CM | POA: Insufficient documentation

## 2014-12-15 DIAGNOSIS — Z9889 Other specified postprocedural states: Secondary | ICD-10-CM | POA: Diagnosis not present

## 2014-12-15 DIAGNOSIS — E780 Pure hypercholesterolemia: Secondary | ICD-10-CM | POA: Diagnosis not present

## 2014-12-15 DIAGNOSIS — M7989 Other specified soft tissue disorders: Secondary | ICD-10-CM

## 2014-12-15 DIAGNOSIS — I10 Essential (primary) hypertension: Secondary | ICD-10-CM | POA: Insufficient documentation

## 2014-12-15 DIAGNOSIS — M7122 Synovial cyst of popliteal space [Baker], left knee: Secondary | ICD-10-CM | POA: Diagnosis not present

## 2014-12-15 DIAGNOSIS — Z79899 Other long term (current) drug therapy: Secondary | ICD-10-CM | POA: Diagnosis not present

## 2014-12-15 DIAGNOSIS — R2242 Localized swelling, mass and lump, left lower limb: Secondary | ICD-10-CM | POA: Diagnosis present

## 2014-12-15 DIAGNOSIS — Z86718 Personal history of other venous thrombosis and embolism: Secondary | ICD-10-CM | POA: Insufficient documentation

## 2014-12-15 DIAGNOSIS — Z7982 Long term (current) use of aspirin: Secondary | ICD-10-CM | POA: Insufficient documentation

## 2014-12-15 DIAGNOSIS — K219 Gastro-esophageal reflux disease without esophagitis: Secondary | ICD-10-CM | POA: Insufficient documentation

## 2014-12-15 MED ORDER — HYDROCODONE-ACETAMINOPHEN 5-325 MG PO TABS: 0.5000 | ORAL_TABLET | Freq: Four times a day (QID) | ORAL | Status: DC | PRN

## 2014-12-15 MED ORDER — HYDROCODONE-ACETAMINOPHEN 5-325 MG PO TABS
1.0000 | ORAL_TABLET | Freq: Once | ORAL | Status: AC
  Administered 2014-12-15: 1 via ORAL
  Filled 2014-12-15: qty 1

## 2014-12-15 NOTE — ED Provider Notes (Signed)
CSN: [id number]     Arrival date & time 12/15/14  1703 History   First MD Initiated Contact with Patient 12/15/14 1727     Chief Complaint  Patient presents with  . Leg Swelling    r/o DVT     (Consider location/radiation/quality/duration/timing/severity/associated sxs/prior Treatment) HPI  81 year old female with a history of DVT in her right leg back in 1960 presents to the emergency department today with 3-4 days of rapidly progressing left lower extremity edema and pain. Had been slowly swelling for approximately a month but worsened recently. No rash, erythema, systemic symptoms. No pain outside of the pain and just swelling. Has had decreased mobility secondary to the swelling. No chest pain, shortness of breath, dizziness or syncope. No recent travels, surgeries, airplane rides. Does not use estrogen.  Past Medical History  Diagnosis Date  . Blood clot in vein   . Hypertension   . High cholesterol   . GERD (gastroesophageal reflux disease)   . Sleep apnea   . H/O echocardiogram 09/05/2009    EF >55%   Past Surgical History  Procedure Laterality Date  . Tubal ligation    . Nm myoview ltd  8/804    RULE OUT ISCHEMIA  . Cardiac catheterization  01/27/2003   Family History  Problem Relation Age of Onset  . Family history unknown: Yes   Social History  Substance Use Topics  . Smoking status: Never Smoker   . Smokeless tobacco: None  . Alcohol Use: No   OB History    No data available     Review of Systems  Constitutional: Negative for fever.  HENT: Negative for drooling.   Eyes: Negative for pain.  Respiratory: Negative for choking and shortness of breath.   Cardiovascular: Positive for leg swelling. Negative for chest pain and palpitations.  Gastrointestinal: Negative for abdominal pain and abdominal distention.  Endocrine: Negative for polydipsia and polyuria.  Musculoskeletal: Negative for myalgias.  All other systems reviewed and are  negative.     Allergies  Review of patient's allergies indicates no known allergies.  Home Medications   Prior to Admission medications   Medication Sig Start Date End Date Taking? Authorizing Provider  acetaminophen (TYLENOL) 500 MG tablet Take 1,000 mg by mouth every 6 (six) hours as needed (for pain).   Yes Historical Provider, MD  aspirin EC 81 MG tablet Take 81 mg by mouth daily with breakfast.   Yes Historical Provider, MD  Multiple Vitamins-Minerals (CENTRUM SILVER ADULT 50+) TABS Take 1 tablet by mouth daily with breakfast.   Yes Historical Provider, MD  ranitidine (ZANTAC) 150 MG tablet Take 150 mg by mouth daily with breakfast.   Yes Historical Provider, MD  simvastatin (ZOCOR) 40 MG tablet 1 TABLET ORALLY ONCE A DAY 11/05/14  Yes Historical Provider, MD  valsartan-hydrochlorothiazide (DIOVAN-HCT) 160-25 MG per tablet Take 1 tablet by mouth daily with breakfast.   Yes Historical Provider, MD  VITAMIN D, CHOLECALCIFEROL, PO Take 5,000 Units by mouth daily.   Yes Historical Provider, MD  HYDROcodone-acetaminophen (NORCO/VICODIN) 5-325 MG per tablet Take 0.5 tablets by mouth every 6 (six) hours as needed for moderate pain. 12/15/14   Marily Memos, MD  oxyCODONE-acetaminophen (PERCOCET) 5-325 MG per tablet Take 1 tablet by mouth every 6 (six) hours as needed for moderate pain. Patient not taking: Reported on 12/15/2014 06/13/14   Purvis Sheffield, MD   BP 127/69 mmHg  Pulse 60  Temp(Src) 98.8 F (37.1 C) (Oral)  Resp 20  SpO2 99%  Physical Exam  Constitutional: She is oriented to person, place, and time. She appears well-developed and well-nourished.  HENT:  Head: Normocephalic and atraumatic.  Eyes: Conjunctivae and EOM are normal. Right eye exhibits no discharge. Left eye exhibits no discharge.  Cardiovascular: Normal rate and regular rhythm.   Pulmonary/Chest: Effort normal and breath sounds normal. No respiratory distress.  Abdominal: Soft. She exhibits no distension. There is  no tenderness. There is no rebound.  Musculoskeletal: Normal range of motion. She exhibits edema (LLE). She exhibits no tenderness.  Neurological: She is alert and oriented to person, place, and time.  Skin: Skin is warm and dry.  Nursing note and vitals reviewed.   ED Course  Procedures (including critical care time) Labs Review Labs Reviewed - No data to display  Imaging Review No results found. I, Jaxx Huish, Barbara Cower, personally reviewed and evaluated these images and lab results as part of my medical decision-making.   EKG Interpretation None      MDM   Final diagnoses:  Leg swelling  Baker's cyst, left    No DVT, has large bakers cyst likely from recent knee injection. Will give rx for pain meds and have her follow up with PCP.     Marily Memos, MD 12/16/14 [id number]

## 2014-12-15 NOTE — ED Notes (Signed)
Questions r/t dc answerede by provider. Pt is ambulatory with 1 assist. Pt is a&ox4

## 2014-12-15 NOTE — Progress Notes (Signed)
*  PRELIMINARY RESULTS* Vascular Ultrasound Left lower extremity venous duplex has been completed.  Preliminary findings: Negative for DVT. Baker's cyst noted measuring 7cm.    Farrel Demark, RDMS, RVT  12/15/2014, 6:37 PM

## 2014-12-15 NOTE — ED Notes (Signed)
Patient states she had a steroid injection in her left knee approx 3 weeks ago. Patient states she has had swelling to the left leg since, but rapid swelling to the left leg for the past 3-4 days.

## 2014-12-23 ENCOUNTER — Encounter: Payer: Self-pay | Admitting: Cardiovascular Disease

## 2015-01-27 ENCOUNTER — Ambulatory Visit (HOSPITAL_COMMUNITY)
Admission: RE | Admit: 2015-01-27 | Discharge: 2015-01-27 | Disposition: A | Discharge status: Home / Self Care | Payer: Medicare Other | Source: Ambulatory Visit | Attending: Orthopaedic Surgery | Admitting: Orthopaedic Surgery

## 2015-01-27 ENCOUNTER — Other Ambulatory Visit (HOSPITAL_COMMUNITY): Payer: Self-pay | Admitting: Orthopaedic Surgery

## 2015-01-27 DIAGNOSIS — M79605 Pain in left leg: Secondary | ICD-10-CM | POA: Insufficient documentation

## 2015-01-27 DIAGNOSIS — R52 Pain, unspecified: Secondary | ICD-10-CM

## 2015-01-27 DIAGNOSIS — M7989 Other specified soft tissue disorders: Secondary | ICD-10-CM | POA: Diagnosis not present

## 2015-01-27 NOTE — Progress Notes (Signed)
*  PRELIMINARY RESULTS* Vascular Ultrasound Left lower extremity venous duplex has been completed.  Preliminary findings: negative for DVT. Baker's cyst noted on the left.   Called results to [place].   Farrel Demark, RDMS, RVT  01/27/2015, 3:35 PM

## 2015-04-19 ENCOUNTER — Other Ambulatory Visit: Payer: Self-pay | Admitting: Internal Medicine

## 2015-04-19 DIAGNOSIS — E2839 Other primary ovarian failure: Secondary | ICD-10-CM

## 2015-06-06 ENCOUNTER — Inpatient Hospital Stay: Admission: RE | Admit: 2015-06-06 | Payer: Medicare Other | Source: Ambulatory Visit

## 2015-11-01 ENCOUNTER — Emergency Department (HOSPITAL_COMMUNITY): Payer: Medicare Other

## 2015-11-01 ENCOUNTER — Encounter (HOSPITAL_COMMUNITY): Payer: Self-pay | Admitting: Emergency Medicine

## 2015-11-01 ENCOUNTER — Observation Stay (HOSPITAL_COMMUNITY)
Admission: EM | Admit: 2015-11-01 | Discharge: 2015-11-02 | Disposition: A | Discharge status: Home / Self Care | Payer: Medicare Other | Attending: Internal Medicine | Admitting: Internal Medicine

## 2015-11-01 DIAGNOSIS — K219 Gastro-esophageal reflux disease without esophagitis: Secondary | ICD-10-CM | POA: Diagnosis not present

## 2015-11-01 DIAGNOSIS — G4730 Sleep apnea, unspecified: Secondary | ICD-10-CM | POA: Insufficient documentation

## 2015-11-01 DIAGNOSIS — E7800 Pure hypercholesterolemia, unspecified: Secondary | ICD-10-CM | POA: Diagnosis not present

## 2015-11-01 DIAGNOSIS — Z7982 Long term (current) use of aspirin: Secondary | ICD-10-CM | POA: Insufficient documentation

## 2015-11-01 DIAGNOSIS — I82402 Acute embolism and thrombosis of unspecified deep veins of left lower extremity: Secondary | ICD-10-CM

## 2015-11-01 DIAGNOSIS — Z86718 Personal history of other venous thrombosis and embolism: Secondary | ICD-10-CM | POA: Diagnosis not present

## 2015-11-01 DIAGNOSIS — I82409 Acute embolism and thrombosis of unspecified deep veins of unspecified lower extremity: Secondary | ICD-10-CM | POA: Diagnosis present

## 2015-11-01 DIAGNOSIS — I10 Essential (primary) hypertension: Secondary | ICD-10-CM | POA: Insufficient documentation

## 2015-11-01 DIAGNOSIS — I82412 Acute embolism and thrombosis of left femoral vein: Secondary | ICD-10-CM | POA: Diagnosis not present

## 2015-11-01 DIAGNOSIS — M7989 Other specified soft tissue disorders: Secondary | ICD-10-CM | POA: Diagnosis present

## 2015-11-01 DIAGNOSIS — Z79899 Other long term (current) drug therapy: Secondary | ICD-10-CM | POA: Insufficient documentation

## 2015-11-01 DIAGNOSIS — I824Z2 Acute embolism and thrombosis of unspecified deep veins of left distal lower extremity: Secondary | ICD-10-CM

## 2015-11-01 LAB — COMPREHENSIVE METABOLIC PANEL
ALT: 12 U/L — ABNORMAL LOW (ref 14–54)
ANION GAP: 8 (ref 5–15)
AST: 19 U/L (ref 15–41)
Albumin: 3.4 g/dL — ABNORMAL LOW (ref 3.5–5.0)
Alkaline Phosphatase: 52 U/L (ref 38–126)
BUN: 26 mg/dL — ABNORMAL HIGH (ref 6–20)
CHLORIDE: 110 mmol/L (ref 101–111)
CO2: 24 mmol/L (ref 22–32)
CREATININE: 0.98 mg/dL (ref 0.44–1.00)
Calcium: 9.1 mg/dL (ref 8.9–10.3)
GFR, EST NON AFRICAN AMERICAN: 52 mL/min — AB (ref >60)
Glucose, Bld: 128 mg/dL — ABNORMAL HIGH (ref 65–99)
POTASSIUM: 3.2 mmol/L — AB (ref 3.5–5.1)
SODIUM: 142 mmol/L (ref 135–145)
Total Bilirubin: 0.3 mg/dL (ref 0.3–1.2)
Total Protein: 7.2 g/dL (ref 6.5–8.1)

## 2015-11-01 LAB — HEPARIN LEVEL (UNFRACTIONATED): Heparin Unfractionated: 0.56 IU/mL (ref 0.30–0.70)

## 2015-11-01 LAB — CBC WITH DIFFERENTIAL/PLATELET
Basophils Absolute: 0 10*3/uL (ref 0.0–0.1)
Basophils Relative: 0 %
EOS ABS: 0.1 10*3/uL (ref 0.0–0.7)
Eosinophils Relative: 1 %
HEMATOCRIT: 33.8 % — AB (ref 36.0–46.0)
HEMOGLOBIN: 11.1 g/dL — AB (ref 12.0–15.0)
LYMPHS ABS: 1.8 10*3/uL (ref 0.7–4.0)
LYMPHS PCT: 21 %
MCH: 28.9 pg (ref 26.0–34.0)
MCHC: 32.8 g/dL (ref 30.0–36.0)
MCV: 88 fL (ref 78.0–100.0)
Monocytes Absolute: 0.6 10*3/uL (ref 0.1–1.0)
Monocytes Relative: 7 %
NEUTROS ABS: 6.1 10*3/uL (ref 1.7–7.7)
NEUTROS PCT: 71 %
Platelets: 319 10*3/uL (ref 150–400)
RBC: 3.84 MIL/uL — AB (ref 3.87–5.11)
RDW: 15.4 % (ref 11.5–15.5)
WBC: 8.7 10*3/uL (ref 4.0–10.5)

## 2015-11-01 LAB — PROTIME-INR
INR: 1.22 (ref 0.00–1.49)
PROTHROMBIN TIME: 15.1 s (ref 11.6–15.2)

## 2015-11-01 LAB — TROPONIN I
TROPONIN I: 0.05 ng/mL — AB (ref <0.03)
TROPONIN I: 0.05 ng/mL — AB (ref <0.03)

## 2015-11-01 LAB — APTT: APTT: 28 s (ref 24–37)

## 2015-11-01 LAB — ANTITHROMBIN III: AntiThromb III Func: 95 % (ref 75–120)

## 2015-11-01 MED ORDER — POTASSIUM CHLORIDE CRYS ER 20 MEQ PO TBCR
40.0000 meq | EXTENDED_RELEASE_TABLET | Freq: Two times a day (BID) | ORAL | Status: AC
  Administered 2015-11-01 – 2015-11-02 (×2): 40 meq via ORAL
  Filled 2015-11-01 (×2): qty 2

## 2015-11-01 MED ORDER — SENNOSIDES-DOCUSATE SODIUM 8.6-50 MG PO TABS: 1.0000 | ORAL_TABLET | Freq: Every evening | ORAL | Status: DC | PRN

## 2015-11-01 MED ORDER — IOPAMIDOL (ISOVUE-370) INJECTION 76%
100.0000 mL | Freq: Once | INTRAVENOUS | Status: AC | PRN
  Administered 2015-11-01: 100 mL via INTRAVENOUS

## 2015-11-01 MED ORDER — SODIUM CHLORIDE 0.9% FLUSH
3.0000 mL | Freq: Two times a day (BID) | INTRAVENOUS | Status: DC
  Administered 2015-11-01: 3 mL via INTRAVENOUS

## 2015-11-01 MED ORDER — HEPARIN (PORCINE) IN NACL 100-0.45 UNIT/ML-% IJ SOLN
950.0000 [IU]/h | INTRAMUSCULAR | Status: DC
  Administered 2015-11-01: 950 [IU]/h via INTRAVENOUS
  Filled 2015-11-01: qty 250

## 2015-11-01 MED ORDER — OXYCODONE-ACETAMINOPHEN 5-325 MG PO TABS
1.0000 | ORAL_TABLET | Freq: Four times a day (QID) | ORAL | Status: DC | PRN
  Administered 2015-11-01 – 2015-11-02 (×2): 1 via ORAL
  Filled 2015-11-01 (×2): qty 1

## 2015-11-01 MED ORDER — ONDANSETRON HCL 4 MG/2ML IJ SOLN: 4.0000 mg | Freq: Four times a day (QID) | INTRAMUSCULAR | Status: DC | PRN

## 2015-11-01 MED ORDER — HYDRALAZINE HCL 20 MG/ML IJ SOLN
10.0000 mg | Freq: Four times a day (QID) | INTRAMUSCULAR | Status: DC | PRN
  Administered 2015-11-01: 10 mg via INTRAVENOUS
  Filled 2015-11-01: qty 1

## 2015-11-01 MED ORDER — ONDANSETRON HCL 4 MG PO TABS: 4.0000 mg | ORAL_TABLET | Freq: Four times a day (QID) | ORAL | Status: DC | PRN

## 2015-11-01 MED ORDER — ADULT MULTIVITAMIN W/MINERALS CH
1.0000 | ORAL_TABLET | Freq: Every day | ORAL | Status: DC
  Administered 2015-11-02: 1 via ORAL
  Filled 2015-11-01 (×2): qty 1

## 2015-11-01 MED ORDER — ASPIRIN EC 81 MG PO TBEC: 81.0000 mg | DELAYED_RELEASE_TABLET | Freq: Every day | ORAL | Status: DC

## 2015-11-01 MED ORDER — FAMOTIDINE 20 MG PO TABS
10.0000 mg | ORAL_TABLET | Freq: Every day | ORAL | Status: DC
  Administered 2015-11-01 – 2015-11-02 (×2): 10 mg via ORAL
  Filled 2015-11-01 (×2): qty 1

## 2015-11-01 MED ORDER — HYDROCHLOROTHIAZIDE 25 MG PO TABS
25.0000 mg | ORAL_TABLET | Freq: Every day | ORAL | Status: DC
  Administered 2015-11-02: 25 mg via ORAL
  Filled 2015-11-01: qty 1

## 2015-11-01 MED ORDER — HEPARIN BOLUS VIA INFUSION
2000.0000 [IU] | INTRAVENOUS | Status: AC
  Administered 2015-11-01: 2000 [IU] via INTRAVENOUS
  Filled 2015-11-01: qty 2000

## 2015-11-01 MED ORDER — VALSARTAN-HYDROCHLOROTHIAZIDE 160-25 MG PO TABS: 1.0000 | ORAL_TABLET | Freq: Every day | ORAL | Status: DC

## 2015-11-01 MED ORDER — IRBESARTAN 150 MG PO TABS
150.0000 mg | ORAL_TABLET | Freq: Every day | ORAL | Status: DC
  Administered 2015-11-02: 150 mg via ORAL
  Filled 2015-11-01: qty 1

## 2015-11-01 NOTE — Progress Notes (Signed)
ANTICOAGULATION CONSULT NOTE - f/u Consult  Pharmacy Consult for heparin Indication: DVT  No Known Allergies  Patient Measurements: Height: 5\' 4"  (162.6 cm) Weight: 145 lb (65.772 kg) IBW/kg (Calculated) : 54.7 Heparin Dosing Weight: 65.8 kg (TBW)  Vital Signs: Temp: 98.1 F (36.7 C) (06/28 2105) Temp Source: Oral (06/28 2105) BP: 140/73 mmHg (06/28 2105) Pulse Rate: 78 (06/28 2105)  Labs:  Recent Labs  11/01/15 1148 11/01/15 1357 11/01/15 1852 11/01/15 2311  HGB 11.1*  --   --   --   HCT 33.8*  --   --   --   PLT 319  --   --   --   APTT  --  28  --   --   LABPROT 15.1  --   --   --   INR 1.22  --   --   --   HEPARINUNFRC  --   --   --  0.56  CREATININE 0.98  --   --   --   TROPONINI 0.05*  --  0.05*  --     Estimated Creatinine Clearance: 40.6 mL/min (by C-G formula based on Cr of 0.98).   Medical History: Past Medical History  Diagnosis Date  . Blood clot in vein   . Hypertension   . High cholesterol   . GERD (gastroesophageal reflux disease)   . Sleep apnea   . H/O echocardiogram 09/05/2009    EF >55%    Assessment: 84 yoF [facility] for new extensive DVT (femoral vein to calf).  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

## 2015-11-01 NOTE — H&P (Signed)
History and Physical  [name]:[id number] DOB: 1931-05-02 DOA: 11/01/2015   PCP: Alva Garnet., MD   Patient coming from: Home   Chief Complaint: left leg swelling   HPI: [name] is a 19 F with a history of a blood clot in her right leg in the 60s, thinks she took coumadin for it. States around 4 days ago she began more swelling in her left leg. It is moderately to severely swollen. Outpatient ultrasound done which showed clot from femoral vein down through calf. No chest pain or trouble breathing, no fevers or chills. She does not smoke. No family history of clots. She is not currently on anticoagulation, but she does take a daily ASA.  ED Course: Evaluated and noticed to have troponin of 0.05, so CTPA was done without any PE. Hospitalist asked to admit for new acute DVT.  Review of Systems: Please see HPI for pertinent positives and negatives. A complete 10 system review of systems are otherwise negative.  Past Medical History  Diagnosis Date  . Blood clot in vein   . Hypertension   . High cholesterol   . GERD (gastroesophageal reflux disease)   . Sleep apnea   . H/O echocardiogram 09/05/2009    EF >55%   Past Surgical History  Procedure Laterality Date  . Tubal ligation    . Nm myoview ltd  8/804    RULE OUT ISCHEMIA  . Cardiac catheterization  01/27/2003  . Abdominal hysterectomy      Social History:  reports that she has never smoked. She has never used smokeless tobacco. She reports that she does not drink alcohol or use illicit drugs.   No Known Allergies  Family History  Problem Relation Age of Onset  . Family history unknown: Yes     Prior to Admission medications   Medication Sig Start Date End Date Taking? Authorizing Provider  aspirin EC 81 MG tablet Take 81 mg by mouth daily with breakfast.   Yes Historical Provider, MD  Multiple Vitamins-Minerals (CENTRUM SILVER ADULT 50+) TABS Take 1 tablet by mouth daily with breakfast.   Yes  Historical Provider, MD  ranitidine (ZANTAC) 150 MG tablet Take 150 mg by mouth daily with breakfast.   Yes Historical Provider, MD  simvastatin (ZOCOR) 40 MG tablet 1 TABLET ORALLY ONCE A DAY 11/05/14  Yes Historical Provider, MD  valsartan-hydrochlorothiazide (DIOVAN-HCT) 160-25 MG per tablet Take 1 tablet by mouth daily with breakfast.   Yes Historical Provider, MD  HYDROcodone-acetaminophen (NORCO/VICODIN) 5-325 MG per tablet Take 0.5 tablets by mouth every 6 (six) hours as needed for moderate pain. Patient not taking: Reported on 11/01/2015 12/15/14   Marily Memos, MD  oxyCODONE-acetaminophen (PERCOCET) 5-325 MG per tablet Take 1 tablet by mouth every 6 (six) hours as needed for moderate pain. Patient not taking: Reported on 12/15/2014 06/13/14   Purvis Sheffield, MD  VITAMIN D, CHOLECALCIFEROL, PO Take 5,000 Units by mouth daily.    Historical Provider, MD    Physical Exam: BP 169/76 mmHg  Pulse 56  Temp(Src) 97.8 F (36.6 C) (Oral)  Resp 14  Ht 5\' 4"  (1.626 m)  Wt 65.772 kg (145 lb)  BMI 24.88 kg/m2  SpO2 99%  General:  Alert, oriented, calm, in no acute distress  Eyes: pupils round and reactive to light and accomodation, clear sclerea Neck: supple, no masses, trachea mildline  Cardiovascular: RRR, no murmurs or rubs, 1+ pitting edema in RLE, 3+ pitting edema in LLE with tenderness in left  calf to palpation. Good pulses, no cyanosis. Sensation intact. Respiratory: clear to auscultation bilaterally, no wheezes, no crackles  Abdomen: soft, nontender, nondistended, normal bowel tones heard  Skin: dry, no rashes  Musculoskeletal: no joint effusions, normal range of motion  Psychiatric: appropriate affect, normal speech  Neurologic: extraocular muscles intact, clear speech, moving all extremities with intact sensorium            Labs on Admission:  Basic Metabolic Panel:  Recent Labs Lab 11/01/15 1148  NA 142  K 3.2*  CL 110  CO2 24  GLUCOSE 128*  BUN 26*  CREATININE 0.98    CALCIUM 9.1   Liver Function Tests:  Recent Labs Lab 11/01/15 1148  AST 19  ALT 12*  ALKPHOS 52  BILITOT 0.3  PROT 7.2  ALBUMIN 3.4*   No results for input(s): LIPASE, AMYLASE in the last 168 hours. No results for input(s): AMMONIA in the last 168 hours. CBC:  Recent Labs Lab 11/01/15 1148  WBC 8.7  NEUTROABS 6.1  HGB 11.1*  HCT 33.8*  MCV 88.0  PLT 319   Cardiac Enzymes:  Recent Labs Lab 11/01/15 1148  TROPONINI 0.05*    BNP (last 3 results) No results for input(s): BNP in the last 8760 hours.  ProBNP (last 3 results) No results for input(s): PROBNP in the last 8760 hours.  CBG: No results for input(s): GLUCAP in the last 168 hours.  Radiological Exams on Admission: Ct Angio Chest Pe W/cm &/or Wo Cm  11/01/2015  CLINICAL DATA:  DVT with elevated troponin. EXAM: CT ANGIOGRAPHY CHEST WITH CONTRAST TECHNIQUE: Multidetector CT imaging of the chest was performed using the standard protocol during bolus administration of intravenous contrast. Multiplanar CT image reconstructions and MIPs were obtained to evaluate the vascular anatomy. CONTRAST:  100 mL Isovue 370 IV E COMPARISON:  CT chest 10/16/2009 FINDINGS: Negative for pulmonary embolism. Negative for aortic aneurysm or dissection. Minimal atherosclerotic calcification in the aortic arch. Mild calcification in the left coronary artery. Cardiac enlargement with small pericardial effusion Large hiatal hernia Negative for infiltrate or effusion. Negative for mass or adenopathy. No acute skeletal abnormality. No mass in the upper abdomen Review of the MIP images confirms the above findings. IMPRESSION: Negative for pulmonary embolism Cardiac enlargement with small pericardial effusion Large hiatal hernia Electronically Signed   By: Marlan Palau M.D.   On: 11/01/2015 14:53    Assessment/Plan Present on Admission:  . DVT (deep venous thrombosis) (HCC) Active Problems:   DVT (deep venous thrombosis) (HCC) - this is  her 2nd clot, she will need AC for life. No PE on CTPA. ER ordered hypercoag panel and started heparin gtt. - cont heparin, likely transition to NOAC tomorrow if without complication  Trop - denies chest pain, unlikely to be ACS/NSTEMI. Trend troponin x3.   GERD - PPI   DVT prophylaxis: On hep gtt.   Code Status: FULL   Family Communication: D/w pt and granddaughter in ER today.   Disposition Plan: Likely to home in 1-2 days.   Consults called: None   Admission status: Obs   Time spent: 48 minutes  Jarelly Rinck Vergie Living MD [facility] Pager [phone]  If 7PM-7AM, please contact night-coverage [email or web address] Password [facility]  11/01/2015, 5:17 PM

## 2015-11-01 NOTE — ED Provider Notes (Signed)
CSN: [id number]     Arrival date & time 11/01/15  1045 History   First MD Initiated Contact with Patient 11/01/15 1057     No chief complaint on file.     The history is provided by the patient.  Patient resents with DVT in her left leg. States she had a blood clot in her right leg in the 60s. Is not currently on treatment for. States around 4 days ago she began more swelling in her left leg. It is moderately to severely swollen. Pedal patient ultrasound done which showed clot from femoral vein down through calf. No chest pain or trouble breathing. Fevers or chills. She does not smoke. No family history of clots. She is not currently on anticoagulation.  Past Medical History  Diagnosis Date  . Blood clot in vein   . Hypertension   . High cholesterol   . GERD (gastroesophageal reflux disease)   . Sleep apnea   . H/O echocardiogram 09/05/2009    EF >55%   Past Surgical History  Procedure Laterality Date  . Tubal ligation    . Nm myoview ltd  8/804    RULE OUT ISCHEMIA  . Cardiac catheterization  01/27/2003  . Abdominal hysterectomy     Family History  Problem Relation Age of Onset  . Family history unknown: Yes   Social History  Substance Use Topics  . Smoking status: Never Smoker   . Smokeless tobacco: Never Used  . Alcohol Use: No   OB History    No data available     Review of Systems  Constitutional: Negative for appetite change.  Respiratory: Negative for chest tightness.   Cardiovascular: Positive for leg swelling. Negative for chest pain.  Gastrointestinal: Negative for abdominal pain.  Genitourinary: Negative for dysuria.  Musculoskeletal: Negative for back pain.  Skin: Negative for pallor.  Neurological: Negative for numbness.  Hematological: Negative for adenopathy.      Allergies  Review of patient's allergies indicates no known allergies.  Home Medications   Prior to Admission medications   Medication Sig Start Date End Date Taking? Authorizing  Provider  aspirin EC 81 MG tablet Take 81 mg by mouth daily with breakfast.   Yes Historical Provider, MD  Multiple Vitamins-Minerals (CENTRUM SILVER ADULT 50+) TABS Take 1 tablet by mouth daily with breakfast.   Yes Historical Provider, MD  ranitidine (ZANTAC) 150 MG tablet Take 150 mg by mouth daily with breakfast.   Yes Historical Provider, MD  simvastatin (ZOCOR) 40 MG tablet 1 TABLET ORALLY ONCE A DAY 11/05/14  Yes Historical Provider, MD  valsartan-hydrochlorothiazide (DIOVAN-HCT) 160-25 MG per tablet Take 1 tablet by mouth daily with breakfast.   Yes Historical Provider, MD  HYDROcodone-acetaminophen (NORCO/VICODIN) 5-325 MG per tablet Take 0.5 tablets by mouth every 6 (six) hours as needed for moderate pain. Patient not taking: Reported on 11/01/2015 12/15/14   Marily Memos, MD  oxyCODONE-acetaminophen (PERCOCET) 5-325 MG per tablet Take 1 tablet by mouth every 6 (six) hours as needed for moderate pain. Patient not taking: Reported on 12/15/2014 06/13/14   Purvis Sheffield, MD  VITAMIN D, CHOLECALCIFEROL, PO Take 5,000 Units by mouth daily.    Historical Provider, MD   BP 173/70 mmHg  Pulse 60  Temp(Src) 97.8 F (36.6 C) (Oral)  Resp 24  Ht 5\' 4"  (1.626 m)  Wt 145 lb (65.772 kg)  BMI 24.88 kg/m2  SpO2 100% Physical Exam  Constitutional: She appears well-developed.  HENT:  Head: Atraumatic.  Eyes: EOM are  normal.  Neck: Neck supple.  Cardiovascular: Normal rate.   Pulmonary/Chest: Effort normal.  Abdominal: Bowel sounds are normal.  Musculoskeletal: She exhibits edema.  Moderate edema to left lower extremity. no erythema. No induration.  Neurological: She is alert.    ED Course  Procedures (including critical care time) Labs Review Labs Reviewed  COMPREHENSIVE METABOLIC PANEL - Abnormal; Notable for the following:    Potassium 3.2 (*)    Glucose, Bld 128 (*)    BUN 26 (*)    Albumin 3.4 (*)    ALT 12 (*)    GFR calc non Af Amer 52 (*)    All other components within  normal limits  CBC WITH DIFFERENTIAL/PLATELET - Abnormal; Notable for the following:    RBC 3.84 (*)    Hemoglobin 11.1 (*)    HCT 33.8 (*)    All other components within normal limits  TROPONIN I - Abnormal; Notable for the following:    Troponin I 0.05 (*)    All other components within normal limits  ANTITHROMBIN III  PROTIME-INR  APTT  PROTEIN C ACTIVITY  PROTEIN C, TOTAL  PROTEIN S ACTIVITY  PROTEIN S, TOTAL  LUPUS ANTICOAGULANT PANEL  BETA-2-GLYCOPROTEIN I ABS, IGG/M/A  HOMOCYSTEINE  FACTOR 5 LEIDEN  PROTHROMBIN GENE MUTATION  CARDIOLIPIN ANTIBODIES, IGG, IGM, IGA  HEPARIN LEVEL (UNFRACTIONATED)    Imaging Review Ct Angio Chest Pe W/cm &/or Wo Cm  11/01/2015  CLINICAL DATA:  DVT with elevated troponin. EXAM: CT ANGIOGRAPHY CHEST WITH CONTRAST TECHNIQUE: Multidetector CT imaging of the chest was performed using the standard protocol during bolus administration of intravenous contrast. Multiplanar CT image reconstructions and MIPs were obtained to evaluate the vascular anatomy. CONTRAST:  100 mL Isovue 370 IV E COMPARISON:  CT chest 10/16/2009 FINDINGS: Negative for pulmonary embolism. Negative for aortic aneurysm or dissection. Minimal atherosclerotic calcification in the aortic arch. Mild calcification in the left coronary artery. Cardiac enlargement with small pericardial effusion Large hiatal hernia Negative for infiltrate or effusion. Negative for mass or adenopathy. No acute skeletal abnormality. No mass in the upper abdomen Review of the MIP images confirms the above findings. IMPRESSION: Negative for pulmonary embolism Cardiac enlargement with small pericardial effusion Large hiatal hernia Electronically Signed   By: Marlan Palau M.D.   On: 11/01/2015 14:53   I have personally reviewed and evaluated these images and lab results as part of my medical decision-making.   EKG Interpretation None      MDM   Final diagnoses:  DVT (deep venous thrombosis), left     Patient with DVT. Troponin minimally elevated and CT scan done that did not show pulmonary embolism. The large DVT on left side was discovered on outpatient Doppler. Admit to internal medicine.    Benjiman Core, MD 11/01/15 [phone]

## 2015-11-01 NOTE — ED Notes (Signed)
Pt c/o leg pain and swelling, sent from [facility] for multiple DVTs. Report from facility states "acute DVT left femoral vein, popliteal vein, gastrocnemius vein, posterior tibial vein. Patent left common femoral vein. Chronic partially occlusive right DVT of common femoral, femoral, popliteal, and posterior tibial vein."

## 2015-11-01 NOTE — ED Notes (Signed)
MD at bedside. 

## 2015-11-01 NOTE — Progress Notes (Signed)
ANTICOAGULATION CONSULT NOTE - Initial Consult  Pharmacy Consult for heparin Indication: DVT  No Known Allergies  Patient Measurements:   Heparin Dosing Weight: 65.8 kg (TBW)  Vital Signs: Temp: 97.8 F (36.6 C) (06/28 1230) Temp Source: Oral (06/28 1230) BP: 149/64 mmHg (06/28 1230) Pulse Rate: 60 (06/28 1230)  Labs:  Recent Labs  11/01/15 1148  HGB 11.1*  HCT 33.8*  PLT 319  LABPROT 15.1  INR 1.22  CREATININE 0.98  TROPONINI 0.05*    CrCl cannot be calculated (Unknown ideal weight.).   Medical History: Past Medical History  Diagnosis Date  . Blood clot in vein   . Hypertension   . High cholesterol   . GERD (gastroesophageal reflux disease)   . Sleep apnea   . H/O echocardiogram 09/05/2009    EF >55%    Assessment: 89 yoF admitted from [facility] for new extensive DVT (femoral vein to calf).  Remote R DVT that is chronic; no anticoagulation but does take ASA 81 mg daily   Baseline INR wnl; aPTT pending  Prior anticoagulation: none, ASA 81 daily  Significant events:  Today, 11/01/2015:  CBC: Hgb slightly low  No bleeding or infusion issues documented  CrCl: 41 ml/min  Goal of Therapy: Heparin level 0.3-0.7 units/ml Monitor platelets by anticoagulation protocol: Yes  Plan:  Heparin 2000 units IV bolus x 1  Heparin 950 units/hr IV infusion  Check heparin level 8 hrs after start  Daily CBC, daily heparin level once stable  Monitor for signs of bleeding or thrombosis   Bernadene Person, PharmD Pager: [phone] 11/01/2015, 1:47 PM

## 2015-11-01 NOTE — ED Notes (Signed)
Pt being sent from [facility] for DVTs, Papers to be faxed to ED.

## 2015-11-02 DIAGNOSIS — I824Z2 Acute embolism and thrombosis of unspecified deep veins of left distal lower extremity: Secondary | ICD-10-CM | POA: Diagnosis not present

## 2015-11-02 LAB — CBC
HEMATOCRIT: 33.2 % — AB (ref 36.0–46.0)
Hemoglobin: 11 g/dL — ABNORMAL LOW (ref 12.0–15.0)
MCH: 28.9 pg (ref 26.0–34.0)
MCHC: 33.1 g/dL (ref 30.0–36.0)
MCV: 87.4 fL (ref 78.0–100.0)
PLATELETS: 309 10*3/uL (ref 150–400)
RBC: 3.8 MIL/uL — ABNORMAL LOW (ref 3.87–5.11)
RDW: 15.6 % — AB (ref 11.5–15.5)
WBC: 9 10*3/uL (ref 4.0–10.5)

## 2015-11-02 LAB — BASIC METABOLIC PANEL
ANION GAP: 5 (ref 5–15)
BUN: 23 mg/dL — AB (ref 6–20)
CALCIUM: 9.2 mg/dL (ref 8.9–10.3)
CO2: 25 mmol/L (ref 22–32)
Chloride: 111 mmol/L (ref 101–111)
Creatinine, Ser: 0.85 mg/dL (ref 0.44–1.00)
GFR calc Af Amer: >60 mL/min (ref >60)
GLUCOSE: 109 mg/dL — AB (ref 65–99)
POTASSIUM: 3.9 mmol/L (ref 3.5–5.1)
SODIUM: 141 mmol/L (ref 135–145)

## 2015-11-02 LAB — PROTEIN C, TOTAL: Protein C, Total: 77 % (ref 60–150)

## 2015-11-02 LAB — PROTEIN S, TOTAL: Protein S Ag, Total: 203 % — ABNORMAL HIGH (ref 60–150)

## 2015-11-02 LAB — TROPONIN I
TROPONIN I: 0.05 ng/mL — AB (ref <0.03)
TROPONIN I: 0.05 ng/mL — AB (ref <0.03)

## 2015-11-02 LAB — HOMOCYSTEINE: HOMOCYSTEINE-NORM: 39.6 umol/L — AB (ref 0.0–15.0)

## 2015-11-02 LAB — HEPARIN LEVEL (UNFRACTIONATED): Heparin Unfractionated: 0.69 IU/mL (ref 0.30–0.70)

## 2015-11-02 LAB — PROTEIN S ACTIVITY: Protein S Activity: 110 % (ref 63–140)

## 2015-11-02 LAB — PROTEIN C ACTIVITY: PROTEIN C ACTIVITY: 93 % (ref 73–180)

## 2015-11-02 MED ORDER — APIXABAN 5 MG PO TABS: ORAL_TABLET | ORAL | Status: DC

## 2015-11-02 MED ORDER — APIXABAN 5 MG PO TABS
10.0000 mg | ORAL_TABLET | Freq: Two times a day (BID) | ORAL | Status: DC
  Administered 2015-11-02: 10 mg via ORAL
  Filled 2015-11-02: qty 2

## 2015-11-02 NOTE — Discharge Instructions (Signed)
Information on my medicine - ELIQUIS (apixaban)  This medication education was reviewed with me or my healthcare representative as part of my discharge preparation.  The pharmacist that spoke with me during my hospital stay was:  Lurlean Horns, Stu-PharmD  Why was Eliquis prescribed for you? Eliquis was prescribed to treat blood clots that may have been found in the veins of your legs (deep vein thrombosis) or in your lungs (pulmonary embolism) and to reduce the risk of them occurring again.  What do You need to know about Eliquis ? The starting dose is 10 mg (two 5 mg tablets) taken TWICE daily for the FIRST SEVEN (7) DAYS, then on (enter date)  11/09/15  the dose is reduced to ONE 5 mg tablet taken TWICE daily.  Eliquis may be taken with or without food.   Try to take the dose about the same time in the morning and in the evening. If you have difficulty swallowing the tablet whole please discuss with your pharmacist how to take the medication safely.  Take Eliquis exactly as prescribed and DO NOT stop taking Eliquis without talking to the doctor who prescribed the medication.  Stopping may increase your risk of developing a new blood clot.  Refill your prescription before you run out.  After discharge, you should have regular check-up appointments with your healthcare provider that is prescribing your Eliquis.    What do you do if you miss a dose? If a dose of ELIQUIS is not taken at the scheduled time, take it as soon as possible on the same day and twice-daily administration should be resumed. The dose should not be doubled to make up for a missed dose.  Important Safety Information A possible side effect of Eliquis is bleeding. You should call your healthcare provider right away if you experience any of the following: ? Bleeding from an injury or your nose that does not stop. ? Unusual colored urine (red or dark brown) or unusual colored stools (red or black). ? Unusual  bruising for unknown reasons. ? A serious fall or if you hit your head (even if there is no bleeding).  Some medicines may interact with Eliquis and might increase your risk of bleeding or clotting while on Eliquis. To help avoid this, consult your healthcare provider or pharmacist prior to using any new prescription or non-prescription medications, including herbals, vitamins, non-steroidal anti-inflammatory drugs (NSAIDs) and supplements.  This website has more information on Eliquis (apixaban): http://www.eliquis.com/eliquis/home

## 2015-11-02 NOTE — Progress Notes (Signed)
CSW received inappropriate consult "Pt will dc on Eliquis, please pre-authorize if needed". RN CM, Cookie made aware. No other CSW needs identified. CSW signing off.   Stacy Gardner, LCSWA Clinical Social Worker [phone]

## 2015-11-02 NOTE — Discharge Summary (Signed)
Discharge Summary  [name] ZOX:[id number] DOB: January 08, 1931  PCP: Alva Garnet., MD  Admit date: 11/01/2015 Discharge date: 11/02/2015   Recommendations for Outpatient Follow-up:  1. PCP 1-2 weeks   Discharge Diagnoses:  Active Hospital Problems   Diagnosis Date Noted  . DVT (deep venous thrombosis) (HCC) 11/01/2015  . Acute DVT (deep venous thrombosis) (HCC) 11/01/2015    Resolved Hospital Problems   Diagnosis Date Noted Date Resolved  No resolved problems to display.    Discharge Condition: Stable   Diet recommendation: Regular   Filed Vitals:   11/01/15 2105 11/02/15 0458  BP: 140/73 137/62  Pulse: 78 56  Temp: 98.1 F (36.7 C) 98.1 F (36.7 C)  Resp: 16 16    History of present illness:  [name] is a 64 F with a history of a blood clot in her right leg in the 60s, thinks she took coumadin for it. States around 4 days ago she began more swelling in her left leg. It is moderately to severely swollen. Outpatient ultrasound done which showed clot from femoral vein down through calf. No chest pain or trouble breathing, no fevers or chills. She does not smoke. No family history of clots. She is not currently on anticoagulation, but she does take a daily ASA.  Evaluated and noticed to have troponin of 0.05, so CTPA was done without any PE. Hospitalist asked to admit for new acute DVT.   Hospital Course:   DVT (deep venous thrombosis) (HCC) - this is her 2nd clot, she will need AC for life. No PE on CTPA. ER ordered hypercoag panel and started heparin gtt. She tolerated heparin well overnight and has had improved swelling and pain in the left lower leg. She is ready for discharge home today on Eliquis once insurance preauthorization is complete. Discussed with pharmacy and nursing staff, will ambulate patient prior to discharge, stop heparin gtt and give first dose of Eliquis 10mg  PO this morning.  Elevated Troponin - CTPA with no PE, she denies chest pain or  SOB, EKG unchanged and troponins have been flat, unlikely to be ACS and no further workup warranted.  GERD - PPI  Procedures:  None   Consultations:  None   Discharge Exam: BP 137/62 mmHg  Pulse 56  Temp(Src) 98.1 F (36.7 C) (Oral)  Resp 16  Ht 5\' 4"  (1.626 m)  Wt 65.772 kg (145 lb)  BMI 24.88 kg/m2  SpO2 100%   General: Alert, oriented, calm, in no acute distress   Eyes: pupils round and reactive to light and accomodation, clear sclerea  Neck: supple, no masses, trachea mildline   Cardiovascular: RRR, no murmurs or rubs, 1+ pitting edema in RLE, 2+ pitting edema in LLE with tenderness in left calf to palpation. (This is an improvement from the day prior). Good pulses, no cyanosis. Sensation intact.  Respiratory: clear to auscultation bilaterally, no wheezes, no crackles   Abdomen: soft, nontender, nondistended, normal bowel tones heard   Skin: dry, no rashes   Musculoskeletal: no joint effusions, normal range of motion   Psychiatric: appropriate affect, normal speech   Neurologic: extraocular muscles intact, clear speech, moving all extremities with intact sensorium  Discharge Instructions You were cared for by a hospitalist during your hospital stay. If you have any questions about your discharge medications or the care you received while you were in the hospital after you are discharged, you can call the unit and asked to speak with the hospitalist on call if the  hospitalist that took care of you is not available. Once you are discharged, your primary care physician will handle any further medical issues. Please note that NO REFILLS for any discharge medications will be authorized once you are discharged, as it is imperative that you return to your primary care physician (or establish a relationship with a primary care physician if you do not have one) for your aftercare needs so that they can reassess your need for medications and monitor your lab  values.  Discharge Instructions    Diet - low sodium heart healthy    Complete by:  As directed      Increase activity slowly    Complete by:  As directed             Medication List    STOP taking these medications        aspirin EC 81 MG tablet     HYDROcodone-acetaminophen 5-325 MG tablet  Commonly known as:  NORCO/VICODIN     oxyCODONE-acetaminophen 5-325 MG tablet  Commonly known as:  PERCOCET      TAKE these medications        apixaban 5 MG Tabs tablet  Commonly known as:  ELIQUIS   PO BID for 7 days, then  PO BID thereafter.     CENTRUM SILVER ADULT 50+ Tabs  Take 1 tablet by mouth daily with breakfast.     ranitidine 150 MG tablet  Commonly known as:  ZANTAC  Take 150 mg by mouth daily with breakfast.     simvastatin 40 MG tablet  Commonly known as:  ZOCOR  1 TABLET ORALLY ONCE A DAY     valsartan-hydrochlorothiazide 160-25 MG tablet  Commonly known as:  DIOVAN-HCT  Take 1 tablet by mouth daily with breakfast.     VITAMIN D (CHOLECALCIFEROL) PO  Take 5,000 Units by mouth daily.       No Known Allergies    The results of significant diagnostics from this hospitalization (including imaging, microbiology, ancillary and laboratory) are listed below for reference.    Significant Diagnostic Studies: Ct Angio Chest Pe W/cm &/or Wo Cm  11/01/2015  CLINICAL DATA:  DVT with elevated troponin. EXAM: CT ANGIOGRAPHY CHEST WITH CONTRAST TECHNIQUE: Multidetector CT imaging of the chest was performed using the standard protocol during bolus administration of intravenous contrast. Multiplanar CT image reconstructions and MIPs were obtained to evaluate the vascular anatomy. CONTRAST:  100 mL Isovue 370 IV E COMPARISON:  CT chest 10/16/2009 FINDINGS: Negative for pulmonary embolism. Negative for aortic aneurysm or dissection. Minimal atherosclerotic calcification in the aortic arch. Mild calcification in the left coronary artery. Cardiac enlargement with small  pericardial effusion Large hiatal hernia Negative for infiltrate or effusion. Negative for mass or adenopathy. No acute skeletal abnormality. No mass in the upper abdomen Review of the MIP images confirms the above findings. IMPRESSION: Negative for pulmonary embolism Cardiac enlargement with small pericardial effusion Large hiatal hernia Electronically Signed   By: Marlan Palau M.D.   On: 11/01/2015 14:53    Microbiology: No results found for this or any previous visit (from the past 240 hour(s)).   Labs: Basic Metabolic Panel:  Recent Labs Lab 11/01/15 1148 11/02/15 0055  NA 142 141  K 3.2* 3.9  CL 110 111  CO2 24 25  GLUCOSE 128* 109*  BUN 26* 23*  CREATININE 0.98 0.85  CALCIUM 9.1 9.2   Liver Function Tests:  Recent Labs Lab 11/01/15 1148  AST 19  ALT 12*  ALKPHOS 52  BILITOT 0.3  PROT 7.2  ALBUMIN 3.4*   No results for input(s): LIPASE, AMYLASE in the last 168 hours. No results for input(s): AMMONIA in the last 168 hours. CBC:  Recent Labs Lab 11/01/15 1148 11/02/15 0055  WBC 8.7 9.0  NEUTROABS 6.1  --   HGB 11.1* 11.0*  HCT 33.8* 33.2*  MCV 88.0 87.4  PLT 319 309   Cardiac Enzymes:  Recent Labs Lab 11/01/15 1148 11/01/15 1852 11/02/15 0055 11/02/15 0624  TROPONINI 0.05* 0.05* 0.05* 0.05*   BNP: BNP (last 3 results) No results for input(s): BNP in the last 8760 hours.  ProBNP (last 3 results) No results for input(s): PROBNP in the last 8760 hours.  CBG: No results for input(s): GLUCAP in the last 168 hours.  Time spent: 31 minutes were spent in preparing this discharge including medication reconciliation, counseling, and coordination of care.  Signed:  Montford Barg [employer]  [facility] 11/02/2015, 10:00 AM

## 2015-11-02 NOTE — Progress Notes (Addendum)
Pt's Co-pay for Eliquis is $50.00. Pt is not in her room for this case manager to share this information. Pt's RN was informed of co pay.

## 2015-11-03 LAB — CARDIOLIPIN ANTIBODIES, IGG, IGM, IGA: Anticardiolipin IgA: <9 APL U/mL (ref 0–11)

## 2015-11-03 LAB — BETA-2-GLYCOPROTEIN I ABS, IGG/M/A

## 2015-11-03 LAB — DRVVT MIX: dRVVT Mix: 42.2 s (ref 0.0–47.0)

## 2015-11-03 LAB — LUPUS ANTICOAGULANT PANEL
DRVVT: 47.9 s — ABNORMAL HIGH (ref 0.0–47.0)
PTT Lupus Anticoagulant: 41.2 s (ref 0.0–51.9)

## 2015-11-06 LAB — FACTOR 5 LEIDEN

## 2015-11-08 LAB — PROTHROMBIN GENE MUTATION

## 2016-05-07 ENCOUNTER — Other Ambulatory Visit: Payer: Self-pay | Admitting: Internal Medicine

## 2016-05-07 DIAGNOSIS — E2839 Other primary ovarian failure: Secondary | ICD-10-CM

## 2016-05-24 ENCOUNTER — Other Ambulatory Visit: Payer: Medicare Other

## 2016-05-29 ENCOUNTER — Other Ambulatory Visit: Payer: Medicare Other

## 2016-05-31 ENCOUNTER — Other Ambulatory Visit: Payer: Medicare Other

## 2016-06-05 ENCOUNTER — Ambulatory Visit
Admission: RE | Admit: 2016-06-05 | Discharge: 2016-06-05 | Disposition: A | Discharge status: Home / Self Care | Payer: Medicare Other | Source: Ambulatory Visit | Attending: Internal Medicine | Admitting: Internal Medicine

## 2016-06-05 DIAGNOSIS — E2839 Other primary ovarian failure: Secondary | ICD-10-CM

## 2018-06-29 ENCOUNTER — Other Ambulatory Visit: Payer: Self-pay | Admitting: Internal Medicine

## 2018-07-10 ENCOUNTER — Encounter: Payer: Self-pay | Admitting: Rehabilitation

## 2018-07-10 ENCOUNTER — Other Ambulatory Visit: Payer: Self-pay

## 2018-07-10 ENCOUNTER — Ambulatory Visit: Payer: Medicare Other | Attending: Internal Medicine | Admitting: Rehabilitation

## 2018-07-10 DIAGNOSIS — G8929 Other chronic pain: Secondary | ICD-10-CM | POA: Insufficient documentation

## 2018-07-10 DIAGNOSIS — R2681 Unsteadiness on feet: Secondary | ICD-10-CM

## 2018-07-10 DIAGNOSIS — M25562 Pain in left knee: Secondary | ICD-10-CM | POA: Insufficient documentation

## 2018-07-10 DIAGNOSIS — M25561 Pain in right knee: Secondary | ICD-10-CM | POA: Diagnosis present

## 2018-07-10 DIAGNOSIS — M6281 Muscle weakness (generalized): Secondary | ICD-10-CM

## 2018-07-10 DIAGNOSIS — R2689 Other abnormalities of gait and mobility: Secondary | ICD-10-CM | POA: Insufficient documentation

## 2018-07-10 NOTE — Therapy (Signed)
[facility] [address] [place], [place], [address] Phone: [phone]   Fax:  [phone]  Physical Therapy Evaluation  Patient Details  Name: Berniece SalinesMary D Kanner MRN: [id number] Date of Birth: 02/19/1931 Referring Provider (PT): Andi DevonKimberly Shelton, MD   Encounter Date: 07/10/2018  PT End of Session - 07/10/18 1301    Visit Number  1    Number of Visits  17    Date for PT Re-Evaluation  69/62/9504/08/23   cert written for 90 days, POC for 60 days    Authorization Type  [employer] Medicare-10th visit progress note needed     PT Start Time  1109    PT Stop Time  1150    PT Time Calculation (min)  41 min    Activity Tolerance  Patient tolerated treatment well    Behavior During Therapy  [facility] for tasks assessed/performed       Past Medical History:  Diagnosis Date  . Blood clot in vein   . GERD (gastroesophageal reflux disease)   . H/O echocardiogram 09/05/2009   EF >55%  . High cholesterol   . Hypertension   . Sleep apnea     Past Surgical History:  Procedure Laterality Date  . ABDOMINAL HYSTERECTOMY    . CARDIAC CATHETERIZATION  01/27/2003  . NM MYOVIEW LTD  8/804   RULE OUT ISCHEMIA  . TUBAL LIGATION      There were no vitals filed for this visit.   Subjective Assessment - 07/10/18 1110    Subjective  per pt report, "I'm here for my knees.  About 4 years ago, the MD wanted to do replacements, but because of when I was younger, I had a blood clot in my leg and they told me not to do surgery unless it was absolutely neccessary.  My right knee is worse than my left."      Pertinent History  HTN, HLD, arthritis    Limitations  Walking;Standing    How long can you stand comfortably?  20-30 mins    How long can you walk comfortably?  15-20 mins     Patient Stated Goals  "to walk without as much pain."     Currently in Pain?  Yes    Pain Score  4     Pain Location  Knee    Pain Orientation  Right;Left    Pain Descriptors /  Indicators  Sore    Pain Type  Chronic pain    Pain Onset  More than a month ago    Pain Frequency  Intermittent    Aggravating Factors   moving, bending knees    Pain Relieving Factors  sitting         [facility] PT Assessment - 07/10/18 0001      Assessment   Medical Diagnosis  multiple join pain, arthritis    Referring Provider (PT)  Andi DevonKimberly Shelton, MD    Onset Date/Surgical Date  --   Pain has gradually gotten worse over last 2 years     Precautions   Precautions  Fall      Balance Screen   Has the patient fallen in the past 6 months  No    Has the patient had a decrease in activity level because of a fear of falling?   No    Is the patient reluctant to leave their home because of a fear of falling?   No      Home Environment   Living  Environment  Private residence    Living Arrangements  Alone;Children    Available Help at Discharge  Family;Available PRN/intermittently   takes care of son   Type of Home  House    Home Access  Stairs to enter    Entrance Stairs-Number of Steps  2    Entrance Stairs-Rails  None    Home Layout  One level    Home Equipment  [place] - single point;Walker - 2 wheels;Shower seat   quad tip cane, has tub     Prior Function   Level of Independence  Independent with community mobility with device;Independent with household mobility without device    Vocation  Retired    Tax inspector, going to church, visiting people (has to do stairs, so avoids this at times)      Cognition   Overall Cognitive Status  Within Functional Limits for tasks assessed      Observation/Other Assessments   Observations  Has B knee genu valgus.  Also note B LE swelling esp in the LLE with pitting edema.  Discussed compression TEDS, however due to hand deformity, may not be able to do this on her own.  PT to send note to MD regarding swelling.       Sensation   Light Touch  Appears Intact    Hot/Cold  Appears Intact    Proprioception  Appears Intact       Coordination   Gross Motor Movements are Fluid and Coordinated  Yes    Fine Motor Movements are Fluid and Coordinated  Yes      ROM / Strength   AROM / PROM / Strength  AROM;Strength      AROM   Overall AROM   Deficits    AROM Assessment Site  Knee    Right/Left Knee  Right;Left    Right Knee Extension  -13   10 deg from 0   Right Knee Flexion  107    Left Knee Extension  -2    Left Knee Flexion  103      Strength   Overall Strength  Deficits    Overall Strength Comments  B hip flex 3+/5, all others grossly 4/5 (crepitus in L knee)      Transfers   Transfers  Sit to Stand;Stand to Sit    Sit to Stand  6: Modified independent (Device/Increase time)    Five time sit to stand comments   32.84 secs with single, intermittent BUE support     Stand to Sit  6: Modified independent (Device/Increase time)      Ambulation/Gait   Ambulation/Gait  Yes    Ambulation/Gait Assistance  5: Supervision    Ambulation/Gait Assistance Details  S for safety with quad tip cane     Ambulation Distance (Feet)  65 Feet    Assistive device  Straight cane   quad tip    Gait Pattern  Step-through pattern;Decreased stride length;Right flexed knee in stance;Left flexed knee in stance   B genu valgum   Ambulation Surface  Level;Indoor    Gait velocity  1.75 ft/sec       Standardized Balance Assessment   Standardized Balance Assessment  Timed Up and Go Test      Timed Up and Go Test   TUG  Normal TUG    Normal TUG (seconds)  23.03   without AD               Objective measurements completed on examination:  See above findings.              PT Education - 07/10/18 1300    Education Details  evaluation findings, POC, goals, contacting MD regarding pitting edema in BLEs    Person(s) Educated  Patient    Methods  Explanation    Comprehension  Verbalized understanding       PT Short Term Goals - 07/10/18 1313      PT SHORT TERM GOAL #1   Title  Pt will be independent with  initial HEP in order to indicate decreased fall risk and improved functional strength (Target Date: 08/09/18)    Time  4    Period  Weeks    Status  New    Target Date  08/09/18      PT SHORT TERM GOAL #2   Title  Pt will improve gait speed to >/= 2.35 ft/sec with LRAD in order to indicate safe community ambulation and decreased fall risk.     Time  4    Period  Weeks    Status  New      PT SHORT TERM GOAL #3   Title  Pt will improve 5TSS to </=29 secs w/ single UE support in order to indicate improved functional strength.     Time  4    Period  Weeks    Status  New      PT SHORT TERM GOAL #4   Title  Pt will improve TUG to </=20 secs with LRAD in order to indicate decreased fall risk.     Time  4    Period  Weeks    Status  New      PT SHORT TERM GOAL #5   Title  Will assess BERG and improve score by 4 points from baseline in order to indicate decreased fall risk.      Time  4    Period  Weeks    Status  New      Additional Short Term Goals   Additional Short Term Goals  Yes      PT SHORT TERM GOAL #6   Title  Pt will be able to ascend/descend 4 steps with single rail at mod I level in order to enter/exit friends house safely.     Time  4    Period  Weeks    Status  New        PT Long Term Goals - 07/10/18 1405      PT LONG TERM GOAL #1   Title  Pt will be independent with final HEP in order to indicate improved balance and functional mobility.  (Target Date: 09/08/18)    Time  8    Period  Weeks    Status  New    Target Date  09/08/18      PT LONG TERM GOAL #2   Title  Pt will improve gait speed to >/=2.62 ft/sec w/ LRAD in order to indicate safe community ambulation.     Time  8    Period  Weeks    Status  New      PT LONG TERM GOAL #3   Title  Pt will improve 5TSS to >/=25 secs without UE support in order to indicate improved functional strength.     Time  8    Period  Weeks    Status  New      PT LONG TERM GOAL #4   Title  Pt will  improve TUG to </=17  secs with LRAD in order to indicate decreased fall risk.      Time  8    Period  Weeks    Status  New      PT LONG TERM GOAL #5   Title  Pt will improve BERG balance test by 8 points from baseline in order to indicate decreased fall risk.     Time  8    Period  Weeks    Status  New      Additional Long Term Goals   Additional Long Term Goals  Yes      PT LONG TERM GOAL #6   Title  Pt will ascend/descend 8 steps with single rail at mod I level in order to indicate safe return to community and leisure activity.     Time  8    Period  Weeks    Status  New             Plan - 07/10/18 1302    Clinical Impression Statement  Pt presents with multiple joint pain, specifically B knee pain (R>L) that has gotten progressively worse over the last 2 years.  Note history of arthritis, HTN, and HLD.  Also note B hand (RUE>LUE) deformity from arthritis.  Upon PT evaluation, note BLE deformity into genu valgum, generalized weakness, gait speed of 1.75 ft/sec indicative of high fall risk, 5TSS time of 32.84 secs with single/double UE support indicative of fall risk and decreased functional strength, and TUG time of 23.03 secs without AD indicative of high fall risk.      Personal Factors and Comorbidities  Age;Comorbidity 3+;Finances    Comorbidities  HTN, HLD, arthritis    Examination-Activity Limitations  Locomotion Level;Squat;Stairs;Stand    Examination-Participation Restrictions  Community Activity;Shop;Cleaning;Laundry    Stability/Clinical Decision Making  Evolving/Moderate complexity    Clinical Decision Making  Moderate    Rehab Potential  Good    PT Frequency  2x / week    PT Duration  8 weeks    PT Treatment/Interventions  ADLs/Self Care Home Management;Aquatic Therapy;Electrical Stimulation;Iontophoresis 4mg /ml Dexamethasone;Ultrasound;DME Instruction;Gait training;Stair training;Functional mobility training;Therapeutic activities;Therapeutic exercise;Balance training;Neuromuscular  re-education;Patient/family education;Manual techniques;Passive range of motion;Dry needling;Vestibular    PT Next Visit Plan  BERG, would she benefit from use of rollator for speed/distance??, HEP for BLE strengthening (mini squats, sit<>stand if she can tolerate) hip abd strengthening.      Consulted and Agree with Plan of Care  Patient       Patient will benefit from skilled therapeutic intervention in order to improve the following deficits and impairments:  Decreased activity tolerance, Decreased balance, Decreased endurance, Decreased knowledge of use of DME, Decreased mobility, Decreased range of motion, Difficulty walking, Decreased strength, Impaired perceived functional ability, Impaired flexibility, Impaired UE functional use, Postural dysfunction, Pain(will address pain via therex)  Visit Diagnosis: Unsteadiness on feet  Muscle weakness (generalized)  Chronic pain of left knee  Chronic pain of right knee  Other abnormalities of gait and mobility     Problem List Patient Active Problem List   Diagnosis Date Noted  . DVT (deep venous thrombosis) (HCC) 11/01/2015  . Acute DVT (deep venous thrombosis) (HCC) 11/01/2015    Harriet Butte, PT, MPT [facility] [address] [place], [place], [address] Phone: [phone]   Fax:  [phone] 07/10/18, 2:09 PM  Name: YIYI EDDINGTON MRN: [id number] Date of Birth: [birth year]

## 2018-07-13 ENCOUNTER — Ambulatory Visit: Payer: Medicare Other | Admitting: Rehabilitation

## 2018-07-16 ENCOUNTER — Ambulatory Visit: Payer: Medicare Other | Admitting: Rehabilitation

## 2018-07-16 ENCOUNTER — Encounter: Payer: Self-pay | Admitting: Rehabilitation

## 2018-07-16 DIAGNOSIS — R2689 Other abnormalities of gait and mobility: Secondary | ICD-10-CM

## 2018-07-16 DIAGNOSIS — M6281 Muscle weakness (generalized): Secondary | ICD-10-CM

## 2018-07-16 DIAGNOSIS — G8929 Other chronic pain: Secondary | ICD-10-CM

## 2018-07-16 DIAGNOSIS — M25561 Pain in right knee: Secondary | ICD-10-CM

## 2018-07-16 DIAGNOSIS — R2681 Unsteadiness on feet: Secondary | ICD-10-CM

## 2018-07-16 DIAGNOSIS — M25562 Pain in left knee: Secondary | ICD-10-CM

## 2018-07-16 NOTE — Therapy (Signed)
[facility] [address] [place], [place], [address] Phone: [phone]   Fax:  [phone]  Physical Therapy Treatment  Patient Details  Name: [name] MRN: [id number] Date of Birth: 05/09/1930 Referring Provider (PT): Andi Devon, MD   Encounter Date: 07/16/2018  PT End of Session - 07/16/18 1014    Visit Number  2    Number of Visits  17    Date for PT Re-Evaluation  41/58/30   cert written for 90 days, POC for 60 days    Authorization Type  [employer] Medicare-10th visit progress note needed     PT Start Time  0846    PT Stop Time  0932    PT Time Calculation (min)  46 min    Activity Tolerance  Patient tolerated treatment well    Behavior During Therapy  [facility] for tasks assessed/performed       Past Medical History:  Diagnosis Date  . Blood clot in vein   . GERD (gastroesophageal reflux disease)   . H/O echocardiogram 09/05/2009   EF >55%  . High cholesterol   . Hypertension   . Sleep apnea     Past Surgical History:  Procedure Laterality Date  . ABDOMINAL HYSTERECTOMY    . CARDIAC CATHETERIZATION  01/27/2003  . NM MYOVIEW LTD  8/804   RULE OUT ISCHEMIA  . TUBAL LIGATION      There were no vitals filed for this visit.  Subjective Assessment - 07/16/18 1007    Subjective  Pt reports feeling pretty good today.     Pertinent History  HTN, HLD, arthritis    Limitations  Walking;Standing    How long can you stand comfortably?  20-30 mins    How long can you walk comfortably?  15-20 mins     Patient Stated Goals  "to walk without as much pain."     Currently in Pain?  Yes    Pain Score  2    1.5   Pain Location  Knee    Pain Orientation  Right;Left    Pain Type  Chronic pain    Pain Onset  More than a month ago    Pain Frequency  Intermittent    Aggravating Factors   moving, bending knees    Pain Relieving Factors  sitting                        [facility] Adult PT  Treatment/Exercise - 07/16/18 0001      Ambulation/Gait   Ambulation/Gait  Yes    Ambulation/Gait Assistance  5: Supervision    Ambulation/Gait Assistance Details  Performed single lap with quad tip cane at S level with improved pain noted following manual therapy.     Ambulation Distance (Feet)  115 Feet    Assistive device  Straight cane   with quad tip    Gait Pattern  Step-through pattern;Decreased stride length;Right flexed knee in stance;Left flexed knee in stance    Ambulation Surface  Level;Indoor      Standardized Balance Assessment   Standardized Balance Assessment  Berg Balance Test      Berg Balance Test   Sit to Stand  Able to stand  independently using hands    Standing Unsupported  Able to stand safely 2 minutes    Sitting with Back Unsupported but Feet Supported on Floor or Stool  Able to sit safely and securely 2 minutes    Stand  to Sit  Sits safely with minimal use of hands    Transfers  Able to transfer safely, minor use of hands    Standing Unsupported with Eyes Closed  Able to stand 10 seconds safely    Standing Ubsupported with Feet Together  Needs help to attain position and unable to hold for 15 seconds   Pt unable to achieve feet together due to knee deformity   From Standing, Reach Forward with Outstretched Arm  Can reach forward >12 cm safely (5")   7"   From Standing Position, Pick up Object from Floor  Able to pick up shoe, needs supervision    From Standing Position, Turn to Look Behind Over each Shoulder  Looks behind one side only/other side shows less weight shift    Turn 360 Degrees  Able to turn 360 degrees safely but slowly    Standing Unsupported, Alternately Place Feet on Step/Stool  Able to complete >2 steps/needs minimal assist    Standing Unsupported, One Foot in Front  Able to plae foot ahead of the other independently and hold 30 seconds    Standing on One Leg  Tries to lift leg/unable to hold 3 seconds but remains standing independently     Total Score  39      Exercises   Exercises  Knee/Hip;Other Exercises    Other Exercises   First half of dead lift with knees straight, working on maintaining neutral posture and leaning forward as far as possible then squeezing shoulder blades, returning to upright stance.  PT provided trekking pole on back to maintain original two points from stand to first part of dead lift position.  Did 10 reps in this manner, then performed without trekking pole x 8 reps, then holding 2lbs in each hand x 8 reps.  Attempted to perform overhead motion with 2lbs weights, however pt with arthritis and poor motion at shoulder, therefore had her perform forward punches x 8 reps on each side.  Ended with attempting to perform mini squats, however due to knee pain and crepitus, was unable to perform.        Knee/Hip Exercises: Aerobic   Stepper  Performed seated stepper x 6 mins at 1.5 resistance with BUEs/LEs.  Began with 1.5 mins at comfortable pace increasing to fast pace at 2 mins and then doing 30 sec intervals of fast then comfortable pace until 6 mins.       Manual Therapy   Manual Therapy  Manual Traction    Manual Traction  Performed open pack R and L knee distraction x 3-4 mins each to increase fluid into joint and decrease inflammation and pain.  Pt tolerated well.               PT Education - 07/16/18 1013    Education Details  Education on balance test, purpose of joint distraction    Person(s) Educated  Patient    Methods  Explanation    Comprehension  Verbalized understanding       PT Short Term Goals - 07/10/18 1313      PT SHORT TERM GOAL #1   Title  Pt will be independent with initial HEP in order to indicate decreased fall risk and improved functional strength (Target Date: 08/09/18)    Time  4    Period  Weeks    Status  New    Target Date  08/09/18      PT SHORT TERM GOAL #2   Title  Pt  will improve gait speed to >/= 2.35 ft/sec with LRAD in order to indicate safe community  ambulation and decreased fall risk.     Time  4    Period  Weeks    Status  New      PT SHORT TERM GOAL #3   Title  Pt will improve 5TSS to </=29 secs w/ single UE support in order to indicate improved functional strength.     Time  4    Period  Weeks    Status  New      PT SHORT TERM GOAL #4   Title  Pt will improve TUG to </=20 secs with LRAD in order to indicate decreased fall risk.     Time  4    Period  Weeks    Status  New      PT SHORT TERM GOAL #5   Title  Will assess BERG and improve score by 4 points from baseline in order to indicate decreased fall risk.      Time  4    Period  Weeks    Status  New      Additional Short Term Goals   Additional Short Term Goals  Yes      PT SHORT TERM GOAL #6   Title  Pt will be able to ascend/descend 4 steps with single rail at mod I level in order to enter/exit friends house safely.     Time  4    Period  Weeks    Status  New        PT Long Term Goals - 07/10/18 1405      PT LONG TERM GOAL #1   Title  Pt will be independent with final HEP in order to indicate improved balance and functional mobility.  (Target Date: 09/08/18)    Time  8    Period  Weeks    Status  New    Target Date  09/08/18      PT LONG TERM GOAL #2   Title  Pt will improve gait speed to >/=2.62 ft/sec w/ LRAD in order to indicate safe community ambulation.     Time  8    Period  Weeks    Status  New      PT LONG TERM GOAL #3   Title  Pt will improve 5TSS to >/=25 secs without UE support in order to indicate improved functional strength.     Time  8    Period  Weeks    Status  New      PT LONG TERM GOAL #4   Title  Pt will improve TUG to </=17 secs with LRAD in order to indicate decreased fall risk.      Time  8    Period  Weeks    Status  New      PT LONG TERM GOAL #5   Title  Pt will improve BERG balance test by 8 points from baseline in order to indicate decreased fall risk.     Time  8    Period  Weeks    Status  New      Additional  Long Term Goals   Additional Long Term Goals  Yes      PT LONG TERM GOAL #6   Title  Pt will ascend/descend 8 steps with single rail at mod I level in order to indicate safe return to community and leisure activity.     Time  8    Period  Weeks    Status  New            Plan - 07/16/18 1014    Clinical Impression Statement  Skilled session focused on formal assessment of balance with BERG balance test. Pt scored 39/56 indicative of significant fall risk.  Also focused on joint distraction at knees to decrease pain and inflammation, cardiovascular endurance on stepper to continue to reduce inflammation, and beginning to perform generalized strengthening.  Pt tolerated well.      Personal Factors and Comorbidities  Age;Comorbidity 3+;Finances    Comorbidities  HTN, HLD, arthritis    Examination-Activity Limitations  Locomotion Level;Squat;Stairs;Stand    Examination-Participation Restrictions  Community Activity;Shop;Cleaning;Laundry    Stability/Clinical Decision Making  Evolving/Moderate complexity    Rehab Potential  Good    PT Frequency  2x / week    PT Duration  8 weeks    PT Treatment/Interventions  ADLs/Self Care Home Management;Aquatic Therapy;Electrical Stimulation;Iontophoresis 4mg /ml Dexamethasone;Ultrasound;DME Instruction;Gait training;Stair training;Functional mobility training;Therapeutic activities;Therapeutic exercise;Balance training;Neuromuscular re-education;Patient/family education;Manual techniques;Passive range of motion;Dry needling;Vestibular    PT Next Visit Plan   HEP for BLE strengthening (mini squats, sit<>stand if she can tolerate) hip abd strengthening.      Consulted and Agree with Plan of Care  Patient       Patient will benefit from skilled therapeutic intervention in order to improve the following deficits and impairments:  Decreased activity tolerance, Decreased balance, Decreased endurance, Decreased knowledge of use of DME, Decreased mobility,  Decreased range of motion, Difficulty walking, Decreased strength, Impaired perceived functional ability, Impaired flexibility, Impaired UE functional use, Postural dysfunction, Pain(will address pain via therex)  Visit Diagnosis: Unsteadiness on feet  Muscle weakness (generalized)  Chronic pain of left knee  Chronic pain of right knee  Other abnormalities of gait and mobility     Problem List Patient Active Problem List   Diagnosis Date Noted  . DVT (deep venous thrombosis) (HCC) 11/01/2015  . Acute DVT (deep venous thrombosis) (HCC) 11/01/2015    Harriet Butte, PT, MPT [facility] [address] [place], [place], [address] Phone: [phone]   Fax:  [phone] 07/16/18, 11:54 AM  Name: [name] MRN: [id number] Date of Birth: 11-04-1930

## 2018-07-21 ENCOUNTER — Ambulatory Visit: Payer: Medicare Other | Admitting: Physical Therapy

## 2018-07-21 ENCOUNTER — Encounter: Payer: Self-pay | Admitting: Physical Therapy

## 2018-07-21 DIAGNOSIS — M25562 Pain in left knee: Secondary | ICD-10-CM

## 2018-07-21 DIAGNOSIS — R2689 Other abnormalities of gait and mobility: Secondary | ICD-10-CM

## 2018-07-21 DIAGNOSIS — M25561 Pain in right knee: Secondary | ICD-10-CM

## 2018-07-21 DIAGNOSIS — M6281 Muscle weakness (generalized): Secondary | ICD-10-CM

## 2018-07-21 DIAGNOSIS — G8929 Other chronic pain: Secondary | ICD-10-CM

## 2018-07-21 DIAGNOSIS — R2681 Unsteadiness on feet: Secondary | ICD-10-CM

## 2018-07-21 NOTE — Patient Instructions (Signed)
Access Code: [email or web address]  URL: [email or web address]  Date: 07/21/2018  Prepared by: Sallyanne Kuster   Exercises  Sit to Stand - 5 reps - 2 sets - 1x daily - 5x weekly  Side Stepping with Counter Support - 3 reps - 1 sets - 1x daily - 5x weekly  Standing March with Counter Support - 10 reps - 1 sets - 1x daily - 5x weekly

## 2018-07-21 NOTE — Therapy (Signed)
[facility] [address] [place], [place], [address] Phone: [phone]   Fax:  [phone]  Physical Therapy Treatment  Patient Details  Name: Tami SalinesMary D Landry MRN: [id number] Date of Birth: 06/13/1930 Referring Provider (PT): Andi DevonKimberly Shelton, MD   Encounter Date: 07/21/2018  PT End of Session - 07/21/18 1108    Visit Number  3    Number of Visits  17    Date for PT Re-Evaluation  69/62/9504/08/23   cert written for 90 days, POC for 60 days    Authorization Type  [employer] Medicare-10th visit progress note needed     PT Start Time  1103    PT Stop Time  1145    PT Time Calculation (min)  42 min    Equipment Utilized During Treatment  Gait belt    Activity Tolerance  Patient tolerated treatment well    Behavior During Therapy  WFL for tasks assessed/performed       Past Medical History:  Diagnosis Date  . Blood clot in vein   . GERD (gastroesophageal reflux disease)   . H/O echocardiogram 09/05/2009   EF >55%  . High cholesterol   . Hypertension   . Sleep apnea     Past Surgical History:  Procedure Laterality Date  . ABDOMINAL HYSTERECTOMY    . CARDIAC CATHETERIZATION  01/27/2003  . NM MYOVIEW LTD  8/804   RULE OUT ISCHEMIA  . TUBAL LIGATION      There were no vitals filed for this visit.  Subjective Assessment - 07/21/18 1107    Subjective  No new complaints. No falls. Reports feeling "pretty good" today.     Pertinent History  HTN, HLD, arthritis    Limitations  Walking;Standing    How long can you stand comfortably?  20-30 mins    How long can you walk comfortably?  15-20 mins     Patient Stated Goals  "to walk without as much pain."     Currently in Pain?  Yes    Pain Score  2     Pain Location  Knee    Pain Orientation  Left;Right    Pain Descriptors / Indicators  Sore    Pain Type  Chronic pain    Pain Onset  More than a month ago    Pain Frequency  Intermittent    Aggravating Factors   moving, bending  knees    Pain Relieving Factors  sitting          [facility] Adult PT Treatment/Exercise - 07/21/18 1109      Transfers   Transfers  Sit to Stand;Stand to Sit    Sit to Stand  6: Modified independent (Device/Increase time)    Stand to Sit  6: Modified independent (Device/Increase time)      Ambulation/Gait   Ambulation/Gait  Yes    Ambulation/Gait Assistance  5: Supervision    Ambulation/Gait Assistance Details  cues on posture and weight shifting with gait.     Ambulation Distance (Feet)  --   around gym in session    Assistive device  Straight cane    Gait Pattern  Step-through pattern;Decreased stride length;Right flexed knee in stance;Left flexed knee in stance    Ambulation Surface  Level;Indoor      Exercises   Exercises  Other Exercises    Other Exercises   issued HEP to address LE strengthening. Refer to Med[employer] program for full details.       Knee/Hip Exercises:  Aerobic   Other Aerobic  Scifit level 1.8 with UE/LE's for 8 minutes with goal >/= 35 rpm for strengthening and activity tolerance.       Manual Therapy   Manual Therapy  Manual Traction;Joint mobilization    Manual therapy comments  to bil knees for decr pain and improved range of motion    Joint Mobilization  gentle grade 1-2 mobs in ant/posterior direction for improved mobility.               Manual Traction  Performed open pack R and L knee distraction x 3-4 mins each to increase fluid into joint and decrease inflammation and pain.  Pt tolerated well.         Access Code: [email or web address]  URL: [email or web address]  Date: 07/21/2018  Prepared by: Sallyanne KusterKathy Bury   Exercises  Sit to Stand - 5 reps - 2 sets - 1x daily - 5x weekly  Side Stepping with Counter Support - 3 reps - 1 sets - 1x daily - 5x weekly  Standing March with Counter Support - 10 reps - 1 sets - 1x daily - 5x weekly       PT Education - 07/21/18 1135    Education Details  HEP for strengthening.     Person(s) Educated  Patient     Methods  Explanation;Demonstration;Verbal cues;Handout    Comprehension  Verbalized understanding;Returned demonstration;Verbal cues required;Need further instruction       PT Short Term Goals - 07/10/18 1313      PT SHORT TERM GOAL #1   Title  Pt will be independent with initial HEP in order to indicate decreased fall risk and improved functional strength (Target Date: 08/09/18)    Time  4    Period  Weeks    Status  New    Target Date  08/09/18      PT SHORT TERM GOAL #2   Title  Pt will improve gait speed to >/= 2.35 ft/sec with LRAD in order to indicate safe community ambulation and decreased fall risk.     Time  4    Period  Weeks    Status  New      PT SHORT TERM GOAL #3   Title  Pt will improve 5TSS to </=29 secs w/ single UE support in order to indicate improved functional strength.     Time  4    Period  Weeks    Status  New      PT SHORT TERM GOAL #4   Title  Pt will improve TUG to </=20 secs with LRAD in order to indicate decreased fall risk.     Time  4    Period  Weeks    Status  New      PT SHORT TERM GOAL #5   Title  Will assess BERG and improve score by 4 points from baseline in order to indicate decreased fall risk.      Time  4    Period  Weeks    Status  New      Additional Short Term Goals   Additional Short Term Goals  Yes      PT SHORT TERM GOAL #6   Title  Pt will be able to ascend/descend 4 steps with single rail at mod I level in order to enter/exit friends house safely.     Time  4    Period  Weeks    Status  New  PT Long Term Goals - 07/10/18 1405      PT LONG TERM GOAL #1   Title  Pt will be independent with final HEP in order to indicate improved balance and functional mobility.  (Target Date: 09/08/18)    Time  8    Period  Weeks    Status  New    Target Date  09/08/18      PT LONG TERM GOAL #2   Title  Pt will improve gait speed to >/=2.62 ft/sec w/ LRAD in order to indicate safe community ambulation.     Time  8     Period  Weeks    Status  New      PT LONG TERM GOAL #3   Title  Pt will improve 5TSS to >/=25 secs without UE support in order to indicate improved functional strength.     Time  8    Period  Weeks    Status  New      PT LONG TERM GOAL #4   Title  Pt will improve TUG to </=17 secs with LRAD in order to indicate decreased fall risk.      Time  8    Period  Weeks    Status  New      PT LONG TERM GOAL #5   Title  Pt will improve BERG balance test by 8 points from baseline in order to indicate decreased fall risk.     Time  8    Period  Weeks    Status  New      Additional Long Term Goals   Additional Long Term Goals  Yes      PT LONG TERM GOAL #6   Title  Pt will ascend/descend 8 steps with single rail at mod I level in order to indicate safe return to community and leisure activity.     Time  8    Period  Weeks    Status  New            Plan - 07/21/18 1108    Clinical Impression Statement  Today's skilled session continued to focus on manual therapy for decreased pain/inflamation with pt reporting good results. Remainder of session addressed initiation of an HEP and LE strengthening. The pt is progressing toward goals and should benefit from continued PT to progress toward unmet goals.     Personal Factors and Comorbidities  Age;Comorbidity 3+;Finances    Comorbidities  HTN, HLD, arthritis    Examination-Activity Limitations  Locomotion Level;Squat;Stairs;Stand    Examination-Participation Restrictions  Community Activity;Shop;Cleaning;Laundry    Stability/Clinical Decision Making  Evolving/Moderate complexity    Rehab Potential  Good    PT Frequency  2x / week    PT Duration  8 weeks    PT Treatment/Interventions  ADLs/Self Care Home Management;Aquatic Therapy;Electrical Stimulation;Iontophoresis /ml Dexamethasone;Ultrasound;DME Instruction;Gait training;Stair training;Functional mobility training;Therapeutic activities;Therapeutic exercise;Balance  training;Neuromuscular re-education;Patient/family education;Manual techniques;Passive range of motion;Dry needling;Vestibular    PT Next Visit Plan  continue with BLE strengthening (mini squats, sit<>stand if she can tolerate) hip abd strengthening, add to HEP as inidcated.     Consulted and Agree with Plan of Care  Patient       Patient will benefit from skilled therapeutic intervention in order to improve the following deficits and impairments:  Decreased activity tolerance, Decreased balance, Decreased endurance, Decreased knowledge of use of DME, Decreased mobility, Decreased range of motion, Difficulty walking, Decreased strength, Impaired perceived functional ability, Impaired flexibility, Impaired UE  functional use, Postural dysfunction, Pain(will address pain via therex)  Visit Diagnosis: Unsteadiness on feet  Muscle weakness (generalized)  Chronic pain of left knee  Chronic pain of right knee  Other abnormalities of gait and mobility     Problem List Patient Active Problem List   Diagnosis Date Noted  . DVT (deep venous thrombosis) (HCC) 11/01/2015  . Acute DVT (deep venous thrombosis) (HCC) 11/01/2015    Sallyanne Kuster, PTA, [facility] [address] [place], [place] [address] [phone] 07/21/18, 6:48 PM   Name: [name] MRN: [id number] Date of Birth: [birth year]

## 2018-07-23 ENCOUNTER — Ambulatory Visit: Payer: Medicare Other | Admitting: Physical Therapy

## 2018-07-23 DIAGNOSIS — M25561 Pain in right knee: Secondary | ICD-10-CM

## 2018-07-23 DIAGNOSIS — R2689 Other abnormalities of gait and mobility: Secondary | ICD-10-CM

## 2018-07-23 DIAGNOSIS — G8929 Other chronic pain: Secondary | ICD-10-CM

## 2018-07-23 DIAGNOSIS — R2681 Unsteadiness on feet: Secondary | ICD-10-CM

## 2018-07-23 DIAGNOSIS — M25562 Pain in left knee: Secondary | ICD-10-CM

## 2018-07-23 DIAGNOSIS — M6281 Muscle weakness (generalized): Secondary | ICD-10-CM

## 2018-07-24 NOTE — Therapy (Signed)
[facility] [address] [place], [place], [address] Phone: [phone]   Fax:  [phone]  Physical Therapy Treatment  Patient Details  Name: Tami SalinesMary D Bocek MRN: [id number] Date of Birth: 09/19/1930 Referring Provider (PT): Andi DevonKimberly Shelton, MD   Encounter Date: 07/23/2018  PT End of Session - 07/24/18 0609    Visit Number  4    Number of Visits  17    Date for PT Re-Evaluation  10/08/18    Authorization Type  [employer] Medicare-10th visit progress note needed     PT Start Time  1530    PT Stop Time  1615    PT Time Calculation (min)  45 min    Activity Tolerance  Patient tolerated treatment well    Behavior During Therapy  [facility] for tasks assessed/performed       Past Medical History:  Diagnosis Date  . Blood clot in vein   . GERD (gastroesophageal reflux disease)   . H/O echocardiogram 09/05/2009   EF >55%  . High cholesterol   . Hypertension   . Sleep apnea     Past Surgical History:  Procedure Laterality Date  . ABDOMINAL HYSTERECTOMY    . CARDIAC CATHETERIZATION  01/27/2003  . NM MYOVIEW LTD  8/804   RULE OUT ISCHEMIA  . TUBAL LIGATION      There were no vitals filed for this visit.  Subjective Assessment - 07/24/18 0603    Subjective  She says her knees are sore but no pain, I can tell a difference with PT that it is helping my knees    Pertinent History  HTN, HLD, arthritis    Limitations  Walking;Standing    Patient Stated Goals  "to walk without as much pain."     Currently in Pain?  No/denies                       [facility] - 07/24/18 0001      Knee/Hip Exercises: Aerobic   Nustep  L3 UE/LE X 8 min for knee ROM and endurance      Knee/Hip Exercises: Standing   Knee Flexion  Both;15 reps    Knee Flexion Limitations  UE support    Hip Flexion  Both;10 reps    Hip Flexion Limitations  UE support    Hip Abduction  Both;15 reps    Abduction Limitations  UE  support    Forward Step Up  Both;5 reps;Hand Hold: 2;Step Height: 6"    Forward Step Up Limitations  cues not to pull up so much with her arms       Knee/Hip Exercises: Seated   Long Arc Quad  Both;15 reps    Sit to [employer]Sand  2 sets;5 reps;without UE support   minimal push off with hands on thighs     Knee/Hip Exercises: Sidelying   Clams  X 20 bilat      Manual Therapy   Manual Therapy  Manual Traction;Joint mobilization    Manual therapy comments  to bil knees for decr pain and improved range of motion    Joint Mobilization  gentle grade 1-2 mobs in ant/posterior direction for improved mobility.               Manual Traction  Performed open pack R and L knee distraction x 3-4 mins each to increase fluid into joint and decrease inflammation and pain.  Pt tolerated well.  PT Education - 07/24/18 0609    Education Details  POC that we will be closing for next 2 weeks and that she really needs to be consistent with HEP during this time which was reviewed with her.    Person(s) Educated  Patient    Methods  Explanation;Demonstration    Comprehension  Verbalized understanding;Returned demonstration       PT Short Term Goals - 07/10/18 1313      PT SHORT TERM GOAL #1   Title  Pt will be independent with initial HEP in order to indicate decreased fall risk and improved functional strength (Target Date: 08/09/18)    Time  4    Period  Weeks    Status  New    Target Date  08/09/18      PT SHORT TERM GOAL #2   Title  Pt will improve gait speed to >/= 2.35 ft/sec with LRAD in order to indicate safe community ambulation and decreased fall risk.     Time  4    Period  Weeks    Status  New      PT SHORT TERM GOAL #3   Title  Pt will improve 5TSS to </=29 secs w/ single UE support in order to indicate improved functional strength.     Time  4    Period  Weeks    Status  New      PT SHORT TERM GOAL #4   Title  Pt will improve TUG to </=20 secs with LRAD in order to  indicate decreased fall risk.     Time  4    Period  Weeks    Status  New      PT SHORT TERM GOAL #5   Title  Will assess BERG and improve score by 4 points from baseline in order to indicate decreased fall risk.      Time  4    Period  Weeks    Status  New      Additional Short Term Goals   Additional Short Term Goals  Yes      PT SHORT TERM GOAL #6   Title  Pt will be able to ascend/descend 4 steps with single rail at mod I level in order to enter/exit friends house safely.     Time  4    Period  Weeks    Status  New        PT Long Term Goals - 07/10/18 1405      PT LONG TERM GOAL #1   Title  Pt will be independent with final HEP in order to indicate improved balance and functional mobility.  (Target Date: 09/08/18)    Time  8    Period  Weeks    Status  New    Target Date  09/08/18      PT LONG TERM GOAL #2   Title  Pt will improve gait speed to >/=2.62 ft/sec w/ LRAD in order to indicate safe community ambulation.     Time  8    Period  Weeks    Status  New      PT LONG TERM GOAL #3   Title  Pt will improve 5TSS to >/=25 secs without UE support in order to indicate improved functional strength.     Time  8    Period  Weeks    Status  New      PT LONG TERM GOAL #4   Title  Pt will improve TUG to </=17 secs with LRAD in order to indicate decreased fall risk.      Time  8    Period  Weeks    Status  New      PT LONG TERM GOAL #5   Title  Pt will improve BERG balance test by 8 points from baseline in order to indicate decreased fall risk.     Time  8    Period  Weeks    Status  New      Additional Long Term Goals   Additional Long Term Goals  Yes      PT LONG TERM GOAL #6   Title  Pt will ascend/descend 8 steps with single rail at mod I level in order to indicate safe return to community and leisure activity.     Time  8    Period  Weeks    Status  New            Plan - 07/24/18 0100    Clinical Impression Statement  Session focused on  progressing knee strength, ROM, and overall LE endurance today with good tolerance. We were informed at the end of her session that PT will be closing for next 2 weeks to limit the spread of COVID 19 so she her HEP was reviewed and she was educated on the importance of doing this consistently at home.     Personal Factors and Comorbidities  Age;Comorbidity 3+;Finances    Comorbidities  HTN, HLD, arthritis    Examination-Activity Limitations  Locomotion Level;Squat;Stairs;Stand    Examination-Participation Restrictions  Community Activity;Shop;Cleaning;Laundry    Stability/Clinical Decision Making  Evolving/Moderate complexity    Rehab Potential  Good    PT Frequency  2x / week    PT Duration  8 weeks    PT Treatment/Interventions  ADLs/Self Care Home Management;Aquatic Therapy;Electrical Stimulation;Iontophoresis 4mg /ml Dexamethasone;Ultrasound;DME Instruction;Gait training;Stair training;Functional mobility training;Therapeutic activities;Therapeutic exercise;Balance training;Neuromuscular re-education;Patient/family education;Manual techniques;Passive range of motion;Dry needling;Vestibular    PT Next Visit Plan  continue with BLE strengthening (mini squats, sit<>stand if she can tolerate) hip abd strengthening, add to HEP as inidcated.     Consulted and Agree with Plan of Care  Patient       Patient will benefit from skilled therapeutic intervention in order to improve the following deficits and impairments:  Decreased activity tolerance, Decreased balance, Decreased endurance, Decreased knowledge of use of DME, Decreased mobility, Decreased range of motion, Difficulty walking, Decreased strength, Impaired perceived functional ability, Impaired flexibility, Impaired UE functional use, Postural dysfunction, Pain(will address pain via therex)  Visit Diagnosis: Unsteadiness on feet  Muscle weakness (generalized)  Chronic pain of left knee  Chronic pain of right knee  Other abnormalities of  gait and mobility     Problem List Patient Active Problem List   Diagnosis Date Noted  . DVT (deep venous thrombosis) (HCC) 11/01/2015  . Acute DVT (deep venous thrombosis) (HCC) 11/01/2015    Birdie Riddle 07/24/2018, 6:13 AM  [facility] [address] [place], [place], [address] Phone: [phone]   Fax:  [phone]  Name: AVAIYAH BELVEAL MRN: [id number] Date of Birth: [birth year]

## 2018-07-28 ENCOUNTER — Ambulatory Visit: Payer: Medicare Other | Admitting: Physical Therapy

## 2018-07-30 ENCOUNTER — Ambulatory Visit: Payer: Medicare Other | Admitting: Physical Therapy

## 2018-08-04 ENCOUNTER — Ambulatory Visit: Payer: Medicare Other | Admitting: Rehabilitation

## 2018-08-06 ENCOUNTER — Ambulatory Visit: Payer: Medicare Other | Admitting: Rehabilitation

## 2018-08-10 ENCOUNTER — Ambulatory Visit: Payer: Medicare Other | Admitting: Rehabilitation

## 2018-08-13 ENCOUNTER — Ambulatory Visit: Payer: Medicare Other | Admitting: Rehabilitation

## 2018-08-17 ENCOUNTER — Ambulatory Visit: Payer: Medicare Other | Admitting: Rehabilitation

## 2018-08-19 ENCOUNTER — Ambulatory Visit: Payer: Medicare Other | Admitting: Rehabilitation

## 2018-08-24 ENCOUNTER — Ambulatory Visit: Payer: Medicare Other | Admitting: Rehabilitation

## 2018-08-26 ENCOUNTER — Ambulatory Visit: Payer: Medicare Other | Admitting: Rehabilitation

## 2018-08-31 ENCOUNTER — Ambulatory Visit: Payer: Medicare Other | Admitting: Rehabilitation

## 2018-09-02 ENCOUNTER — Ambulatory Visit: Payer: Medicare Other | Admitting: Rehabilitation

## 2018-11-24 ENCOUNTER — Other Ambulatory Visit: Payer: Self-pay | Admitting: *Deleted

## 2018-11-24 DIAGNOSIS — Z20822 Contact with and (suspected) exposure to covid-19: Secondary | ICD-10-CM

## 2018-11-29 LAB — NOVEL CORONAVIRUS, NAA: SARS-CoV-2, NAA: NOT DETECTED

## 2018-12-07 ENCOUNTER — Encounter (HOSPITAL_BASED_OUTPATIENT_CLINIC_OR_DEPARTMENT_OTHER): Payer: Medicare Other | Attending: Internal Medicine

## 2018-12-07 DIAGNOSIS — I10 Essential (primary) hypertension: Secondary | ICD-10-CM | POA: Diagnosis not present

## 2018-12-07 DIAGNOSIS — L97812 Non-pressure chronic ulcer of other part of right lower leg with fat layer exposed: Secondary | ICD-10-CM | POA: Insufficient documentation

## 2018-12-07 DIAGNOSIS — Z86718 Personal history of other venous thrombosis and embolism: Secondary | ICD-10-CM | POA: Diagnosis not present

## 2018-12-07 DIAGNOSIS — I872 Venous insufficiency (chronic) (peripheral): Secondary | ICD-10-CM | POA: Diagnosis not present

## 2018-12-07 DIAGNOSIS — I890 Lymphedema, not elsewhere classified: Secondary | ICD-10-CM | POA: Insufficient documentation

## 2018-12-14 DIAGNOSIS — L97812 Non-pressure chronic ulcer of other part of right lower leg with fat layer exposed: Secondary | ICD-10-CM | POA: Diagnosis not present

## 2018-12-21 DIAGNOSIS — L97812 Non-pressure chronic ulcer of other part of right lower leg with fat layer exposed: Secondary | ICD-10-CM | POA: Diagnosis not present

## 2018-12-28 ENCOUNTER — Other Ambulatory Visit: Payer: Self-pay | Admitting: Internal Medicine

## 2018-12-28 DIAGNOSIS — I872 Venous insufficiency (chronic) (peripheral): Secondary | ICD-10-CM

## 2018-12-28 DIAGNOSIS — L97812 Non-pressure chronic ulcer of other part of right lower leg with fat layer exposed: Secondary | ICD-10-CM | POA: Diagnosis not present

## 2019-01-05 ENCOUNTER — Encounter (HOSPITAL_BASED_OUTPATIENT_CLINIC_OR_DEPARTMENT_OTHER): Payer: Medicare Other | Attending: Internal Medicine

## 2019-01-05 ENCOUNTER — Other Ambulatory Visit: Payer: Self-pay

## 2019-01-05 DIAGNOSIS — I10 Essential (primary) hypertension: Secondary | ICD-10-CM | POA: Insufficient documentation

## 2019-01-05 DIAGNOSIS — L97812 Non-pressure chronic ulcer of other part of right lower leg with fat layer exposed: Secondary | ICD-10-CM | POA: Insufficient documentation

## 2019-01-05 DIAGNOSIS — I70235 Atherosclerosis of native arteries of right leg with ulceration of other part of foot: Secondary | ICD-10-CM | POA: Insufficient documentation

## 2019-01-05 DIAGNOSIS — I872 Venous insufficiency (chronic) (peripheral): Secondary | ICD-10-CM | POA: Diagnosis not present

## 2019-01-05 DIAGNOSIS — I890 Lymphedema, not elsewhere classified: Secondary | ICD-10-CM | POA: Insufficient documentation

## 2019-01-05 DIAGNOSIS — Z86718 Personal history of other venous thrombosis and embolism: Secondary | ICD-10-CM | POA: Insufficient documentation

## 2019-01-07 ENCOUNTER — Ambulatory Visit
Admission: RE | Admit: 2019-01-07 | Discharge: 2019-01-07 | Disposition: A | Discharge status: Home / Self Care | Payer: Medicare Other | Source: Ambulatory Visit | Attending: Internal Medicine | Admitting: Internal Medicine

## 2019-01-07 DIAGNOSIS — I70235 Atherosclerosis of native arteries of right leg with ulceration of other part of foot: Secondary | ICD-10-CM | POA: Diagnosis not present

## 2019-01-07 DIAGNOSIS — I872 Venous insufficiency (chronic) (peripheral): Secondary | ICD-10-CM

## 2019-01-07 NOTE — Consult Note (Signed)
Chief Complaint: Patient was seen in consultation today for venous insufficiency evaluation at the request of Robson,Michael G  Referring Physician(s): Robson,Michael G  History of Present Illness: [name] is a 85 y.o. female with history of lower extremity DVT.  She also states that she had a vein stripping approximately 50 years ago below the right knee.  Patient has chronic lower extremity swelling and has worn compression stockings for many years.  She has recently been developing lower extremity ulcers.  She currently has an ulcer along the medial right ankle and this is reportedly her fourth different ulcer.  According to the patient and family, the current ulcer is slowly healing.  Patient is being evaluated for venous insufficiency and possible treatment options.  Past medical history is significant for rheumatoid arthritis, hypertension and hyperlipidemia.  Patient is currently living at home alone.  Past Medical History:  Diagnosis Date   Blood clot in vein    GERD (gastroesophageal reflux disease)    H/O echocardiogram 09/05/2009   EF >55%   High cholesterol    Hypertension    Sleep apnea     Past Surgical History:  Procedure Laterality Date   ABDOMINAL HYSTERECTOMY     CARDIAC CATHETERIZATION  01/27/2003   NM MYOVIEW LTD  8/804   RULE OUT ISCHEMIA   TUBAL LIGATION      Allergies: Patient has no known allergies.  Medications: Prior to Admission medications   Medication Sig Start Date End Date Taking? Authorizing Provider  apixaban (ELIQUIS) 5 MG TABS tablet 10mg  PO BID for 7 days, then 5mg  PO BID thereafter. 11/02/15   Kirby Crigler, Mir Mohammed, MD  Multiple Vitamins-Minerals (CENTRUM SILVER ADULT 50+) TABS Take 1 tablet by mouth daily with breakfast.    [provider]  ranitidine (ZANTAC) 150 MG tablet Take 150 mg by mouth daily with breakfast.    [provider]  simvastatin (ZOCOR) 40 MG tablet 1 TABLET ORALLY ONCE A DAY  11/05/14   [provider]  valsartan-hydrochlorothiazide (DIOVAN-HCT) 160-25 MG per tablet Take 1 tablet by mouth daily with breakfast.    [provider]  VITAMIN D, CHOLECALCIFEROL, PO Take 5,000 Units by mouth daily.    [provider]     Family History  Family history unknown: Yes    Social History   Socioeconomic History   Marital status: Single    Spouse name: Not on file   Number of children: Not on file   Years of education: Not on file   Highest education level: Not on file  Occupational History   Not on file  Social Needs   Financial resource strain: Not on file   Food insecurity    Worry: Not on file    Inability: Not on file   Transportation needs    Medical: Not on file    Non-medical: Not on file  Tobacco Use   Smoking status: Never Smoker   Smokeless tobacco: Never Used  Substance and Sexual Activity   Alcohol use: No   Drug use: No   Sexual activity: Never  Lifestyle   Physical activity    Days per week: Not on file    Minutes per session: Not on file   Stress: Not on file  Relationships   Social connections    Talks on phone: Not on file    Gets together: Not on file    Attends religious service: Not on file    Active member of club  or organization: Not on file    Attends meetings of clubs or organizations: Not on file    Relationship status: Not on file  Other Topics Concern   Not on file  Social History Narrative   Not on file    Review of Systems  Skin: Positive for wound.       Bilateral lower extremity swelling.    Vital Signs: BP 138/67 (BP Location: Right Arm)    Pulse 71    SpO2 100%   Physical Exam Constitutional:      Appearance: Normal appearance. She is not ill-appearing.  Cardiovascular:     Pulses: Normal pulses.     Comments: Palpable dorsalis pedis pulses bilaterally. Musculoskeletal:     Right lower leg: Edema present.     Left lower leg: Edema present.     Comments:  Bandage on the wound in the medial right ankle region was removed.  There are 2 small wounds with a small amount of healing tissue between the wounds.  No drainage from the wounds.  +3 pitting edema in both ankles.  Bilateral hallux valgus deformities.  Skin:    Comments: No visible varicosities in the lower extremities.  Neurological:     Mental Status: She is alert.        Imaging: US Venous Img Lower Bilateral  Result Date: 01/07/2019 CLINICAL DATA:  85 year old with history of lower extremity wounds. She currently has a wound in the medial right ankle region. Evaluate for venous insufficiency. EXAM: BILATERAL LOWER EXTREMITY VENOUS DUPLEX ULTRASOUND TECHNIQUE: Gray-scale sonography with graded compression, as well as color Doppler and duplex ultrasound, were performed to evaluate the deep and superficial veins of both lower extremities. Spectral Doppler was utilized to evaluate flow at rest and with distal augmentation maneuvers. A complete superficial venous insufficiency exam was performed in the upright standing position. I personally performed the technical portion of the exam. COMPARISON:  09/02/2014 FINDINGS: Right lower extremity: Deep venous system: Partial compressibility of the right common femoral vein with evidence of chronic thrombus. Findings are similar to the exam in 2016. Nonocclusive thrombus in the right common femoral vein. The right saphenofemoral junction is patent. Right profunda femoral vein is patent without thrombus. Normal compressibility, color Doppler flow and augmentation in the right femoral vein. Small amount of chronic thrombus in the proximal femoral vein. Normal compressibility, Doppler flow and augmentation in the popliteal vein. Superficial venous system: Normal caliber of the right great saphenous vein without reflux. Normal caliber of the short saphenous vein without reflux. No varicosities. Left lower extremity: Deep venous system: Normal compressibility,  augmentation and color Doppler flow in the left common femoral vein, left femoral vein and left popliteal vein without thrombus. The left saphenofemoral junction is patent. Left profunda femoral vein is patent without thrombus. Superficial venous system: Normal caliber of the left great saphenous vein without reflux. Normal caliber of the left short saphenous vein without reflux. No varicosities. IMPRESSION: 1. Negative for acute deep venous thrombosis in the lower extremities. 2. Chronic nonocclusive thrombus in the right common femoral vein and proximal right femoral vein. Findings are similar to the exam in 2016. 3. Normal appearance of the great saphenous veins and short saphenous veins bilaterally. No evidence for superficial venous insufficiency. No varicosities. Electronically Signed   By: Richarda Overlie M.D.   On: 01/07/2019 11:20   [place] Rad Eval And Mgmt  Result Date: 01/07/2019 Please refer to "Notes" to see consult details.   Labs:  CBC: No  results for input(s): WBC, HGB, HCT, PLT in the last 8760 hours.  COAGS: No results for input(s): INR, APTT in the last 8760 hours.  BMP: No results for input(s): NA, K, CL, CO2, GLUCOSE, BUN, CALCIUM, CREATININE, GFRNONAA, GFRAA in the last 8760 hours.  Invalid input(s): CMP  LIVER FUNCTION TESTS: No results for input(s): BILITOT, AST, ALT, ALKPHOS, PROT, ALBUMIN in the last 8760 hours.  TUMOR MARKERS: No results for input(s): AFPTM, CEA, CA199, CHROMGRNA in the last 8760 hours.  Assessment and Plan:  85 year old with history of lower extremity DVTs, chronic lower extremity edema and recurrent lower extremity ulcerations.  She currently has a slowly healing ulcer in the medial right ankle region.  Today's ultrasound demonstrated small amount of nonocclusive chronic DVT in the right common femoral vein and right femoral vein which is similar to the exam from 2016.  No evidence for acute DVT in the lower extremities.  In addition, normal appearance  of the superficial veins and there is no evidence for superficial venous insufficiency in either lower extremity.  Explained the ultrasound findings with the patient and family.  Chronic lower extremity likely be secondary to lymphedema and/or post thrombotic syndrome.  Recommended continued use of compression stockings and elevation.  Patient will continue to follow-up with the wound care.  Thank you for this interesting consult.  I greatly enjoyed meeting [name] and look forward to participating in their care.  A copy of this report was sent to the requesting provider on this date.  Electronically Signed: Burman Riis 01/07/2019, 11:26 AM   I spent a total of  15 Minutes  in face to face in clinical consultation, greater than 50% of which was counseling/coordinating care for lower extremity edema and right lower extremity wound.

## 2019-01-12 ENCOUNTER — Other Ambulatory Visit: Payer: Self-pay

## 2019-01-12 ENCOUNTER — Encounter (HOSPITAL_BASED_OUTPATIENT_CLINIC_OR_DEPARTMENT_OTHER): Payer: Self-pay

## 2019-01-12 ENCOUNTER — Encounter (HOSPITAL_BASED_OUTPATIENT_CLINIC_OR_DEPARTMENT_OTHER): Payer: Medicare Other

## 2019-01-12 DIAGNOSIS — I70235 Atherosclerosis of native arteries of right leg with ulceration of other part of foot: Secondary | ICD-10-CM | POA: Diagnosis not present

## 2019-01-18 DIAGNOSIS — I70235 Atherosclerosis of native arteries of right leg with ulceration of other part of foot: Secondary | ICD-10-CM | POA: Diagnosis not present

## 2019-01-25 DIAGNOSIS — I70235 Atherosclerosis of native arteries of right leg with ulceration of other part of foot: Secondary | ICD-10-CM | POA: Diagnosis not present

## 2019-02-01 DIAGNOSIS — I70235 Atherosclerosis of native arteries of right leg with ulceration of other part of foot: Secondary | ICD-10-CM | POA: Diagnosis not present

## 2019-02-08 ENCOUNTER — Encounter (HOSPITAL_BASED_OUTPATIENT_CLINIC_OR_DEPARTMENT_OTHER): Payer: Medicare Other | Attending: Internal Medicine | Admitting: Internal Medicine

## 2019-02-08 ENCOUNTER — Other Ambulatory Visit: Payer: Self-pay

## 2019-02-08 DIAGNOSIS — Z86718 Personal history of other venous thrombosis and embolism: Secondary | ICD-10-CM | POA: Diagnosis not present

## 2019-02-08 DIAGNOSIS — I10 Essential (primary) hypertension: Secondary | ICD-10-CM | POA: Insufficient documentation

## 2019-02-08 DIAGNOSIS — I890 Lymphedema, not elsewhere classified: Secondary | ICD-10-CM | POA: Diagnosis not present

## 2019-02-08 DIAGNOSIS — I70238 Atherosclerosis of native arteries of right leg with ulceration of other part of lower right leg: Secondary | ICD-10-CM | POA: Diagnosis not present

## 2019-02-08 DIAGNOSIS — I872 Venous insufficiency (chronic) (peripheral): Secondary | ICD-10-CM | POA: Diagnosis not present

## 2019-02-08 DIAGNOSIS — L97812 Non-pressure chronic ulcer of other part of right lower leg with fat layer exposed: Secondary | ICD-10-CM | POA: Insufficient documentation

## 2019-02-08 NOTE — Progress Notes (Signed)
[name], Sonji D. ([id number]) Visit Report for 02/08/2019 Debridement Details Patient Name: Date of Service: [name], Tami D. 02/08/2019 2:30 PM Medical Record [id number] Patient Account Number: [id number] Date of Birth/Sex: Treating RN: 12/03/1930 (85 y.o. Dorthula PerfectF) [name], Emeterio ReeveShatara Primary Care Provider: Alva GarnetShelton, Kimberly R Other Clinician: Karl Itoawkins, Destiny Referring Provider: Treating Provider/Extender:Robson, Gaylene BrooksMichael Shelton, Ivonne AndrewKimberly R Weeks in Treatment: 9 Debridement Performed for Wound #5 Right,Medial Lower Leg Assessment: Performed By: Physician Maxwell Caulobson, Michael G., MD Debridement Type: Debridement Level of Consciousness (Pre- Awake and Alert procedure): Pre-procedure Yes - 16:02 Verification/Time Out Taken: Start Time: 16:02 Total Area Debrided (L x W): 0.8 (cm) x 2 (cm) = 1.6 (cm) Tissue and other material Viable, Non-Viable, Slough, Subcutaneous, Slough debrided: Level: Skin/Subcutaneous Tissue Debridement Description: Excisional Instrument: Curette Bleeding: Minimum Hemostasis Achieved: Pressure End Time: 16:04 Procedural Pain: 0 Post Procedural Pain: 0 Response to Treatment: Procedure was tolerated well Level of Consciousness Awake and Alert (Post-procedure): Post Debridement Measurements of Total Wound Length: (cm) 0.8 Width: (cm) 2 Depth: (cm) 0.2 Volume: (cm) 0.251 Character of Wound/Ulcer Post Improved Debridement: Post Procedure Diagnosis Same as Pre-procedure Electronic Signature(s) Signed: 02/08/2019 6:11:18 PM By: Baltazar Najjarobson, Michael MD Signed: 02/08/2019 6:17:12 PM By: Zandra AbtsLynch, Shatara RN, BSN Entered By: Baltazar Najjarobson, Michael on 02/08/2019 18:00:19 -------------------------------------------------------------------------------- HPI Details Patient Name: Date of Service: Tami ComberBROOKS, Tami D. 02/08/2019 2:30 PM Medical Record [id number] Patient Account Number: [id number] Date of Birth/Sex: Treating RN: 04/03/1931 (85 y.o. 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 Patient was given a course of doxycycline for 7 days that she completed, has been putting mupirocin cream on the wounds, leaving it exposed at night and covering with dry gauze during the day. Overall patient who is present with her daughter in the clinic today states that the 2 wounds have been improving to the present size. There is also some seepage in and around the wound area. Both legs are swollen. No changes to dimensions of legs overall in the recent past. Patient was remotely seen here in the clinic in 2014 for right lower leg tibial wounds very similar to the ones were seeing today. 8/10-Patient returns after being initiated in the wound clinic last week. The right medial leg wounds about the right ankle are both slightly smaller in size, not much seepage, legs are not as swollen we are using a 3 layer compression of the right juxta lite on the left she denies any significant pain 8/17; the patient has 2 small open areas on the medial right leg. She has good edema control. There is no doubt she has underlying lymphedema. Still a lot of adherent debris to both wounds 8/24; same 2 small open areas on the medial right leg. Edema control. We have been using Iodoflex since last week and the surface of the wound looks some better. 9/14; 2 small openings on the right medial lower leg. The patient went for her venous reflux studies in [place] [facility]. 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 The findings were similar to when examined 2016. There was normal appearance of the great saphenous veins and short saphenous veins  bilaterally no evidence for superficial venous reflux and no varicosities. No indication for venous ablation We have been using Iodoflex. Changed to Sorbact today 9/21; 2 small openings in close juxtaposition on the right medial lower leg. She had difficulties this week with her 3 layer compression becoming too tight she took this off late last Wednesday/Thursday morning. She did not put anything else on her leg quite a bit of nonpitting/lymphedema today still using Sorbact 9/28; 2 small wounds in close juxtaposition in the right medial lower leg. We have been using Sorbact. Changed to endoform today 10/5; 2 small wounds in close juxtaposition the right medial lower leg I changed to endoform last week unfortunately not much improvement Electronic Signature(s) Signed: 02/08/2019 6:11:18 PM By: Linton Ham MD Entered By: Linton Ham on 02/08/2019 18:01:05 -------------------------------------------------------------------------------- Physical Exam Details Patient Name: Date of Service: Tami Slade D. 02/08/2019 2:30 PM Medical Record [id number] Patient Account Number: [id number] Date of Birth/Sex: Treating RN: 06-15-1930 (85 y.o. [name] Primary Care Provider: Salena Saner Other Clinician: Sandre Kitty Referring Provider: Treating Provider/Extender:Robson, Rhona Leavens, Doristine Locks in Treatment: 9 Constitutional Patient is hypertensive.. Pulse regular and within target range for patient.[name] Respirations regular, non-labored and within target range.. Temperature is normal and within the target range for the patient.[name] Appears in no distress. Notes Wound exam; 2 small wounds on the right medial lower leg. Superior one is smaller and more superficial but there is not much change in  the inferior one which still requires debridement with a #3 curette. The depth is not come in on this at all. There is really no evidence of surrounding infection Electronic Signature(s) Signed: 02/08/2019 6:11:18 PM By: Linton Ham MD Entered By: Linton Ham on 02/08/2019 18:01:52 -------------------------------------------------------------------------------- Physician Orders Details Patient Name: Date of Service: ARNISHA, LAFFOON 02/08/2019 2:30 PM Medical Record [id number] Patient Account Number: [id number] Date of Birth/Sex: Treating RN: [birth year] (85 y.o. [name] Primary Care Provider: Salena Saner Other Clinician: Sandre Kitty Referring Provider: Treating Provider/Extender:Robson, Rhona Leavens, Doristine Locks in Treatment: 9 Verbal / Phone Orders: No Diagnosis Coding ICD-10 Coding Code Description [phone] Non-pressure chronic ulcer of right ankle with fat layer exposed I89.0 Lymphedema, not elsewhere classified I87.2 Venous insufficiency (chronic) (peripheral) I70.235 Atherosclerosis of native arteries of right leg with ulceration of other part of foot Follow-up Appointments Return Appointment in 1 week. Dressing Change Frequency Wound #5 Right,Medial Lower Leg Do not change entire dressing for one week. Skin Barriers/Peri-Wound Care Wound #5 Right,Medial Lower Leg Moisturizing lotion Wound Cleansing Wound #5 Right,Medial Lower Leg May shower with protection. Primary Wound Dressing Wound #5 Right,Medial Lower Leg Endoform - moisten with saline Secondary Dressing Wound #5 Right,Medial Lower Leg Dry Gauze Edema Control 3 Layer Compression System - Right Lower Extremity Avoid standing for long periods of time Elevate legs to the level of the heart or above for 30 minutes daily and/or when sitting, a frequency of: - throughout the day Exercise regularly Support Garment 20-30 mm/Hg pressure to: - Juxtalite to left leg Electronic  Signature(s) Signed: 02/08/2019 6:11:18 PM By: Linton Ham MD Signed: 02/08/2019 6:17:12 PM By: Levan Hurst RN, BSN Entered By: Levan Hurst on 02/08/2019 16:05:11 -------------------------------------------------------------------------------- Problem List Details Patient Name: Date of Service: Tami Slade D. 02/08/2019 2:30 PM Medical Record [id number] Patient Account Number: [id number] Date of Birth/Sex: Treating RN: 05-Nov-1930 (85 y.o. [name] Primary Care Provider: Salena Saner Other Clinician: Sandre Kitty Referring Provider: Treating Provider/Extender:Robson, Rhona Leavens, Doristine Locks  in Treatment: 9 Active Problems ICD-10 Evaluated Encounter Code Description Active Date Today Diagnosis L97.312 Non-pressure chronic ulcer of right ankle with fat layer 12/07/2018 No Yes exposed I89.0 Lymphedema, not elsewhere classified 12/21/2018 No Yes I87.2 Venous insufficiency (chronic) (peripheral) 12/07/2018 No Yes I70.235 Atherosclerosis of native arteries of right leg with 02/01/2019 No Yes ulceration of other part of foot Inactive Problems Resolved Problems Electronic Signature(s) Signed: 02/08/2019 6:11:18 PM By: Baltazar Najjar MD Entered By: Baltazar Najjar on 02/08/2019 18:00:05 -------------------------------------------------------------------------------- Progress Note Details Patient Name: Date of Service: [name] D. 02/08/2019 2:30 PM Medical Record [id number] Patient Account Number: [id number] Date of Birth/Sex: Treating RN: 1930/06/08 (85 y.o. 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 Patient was given a course of doxycycline for 7 days that she completed, has been putting mupirocin cream on the wounds, leaving it exposed at night and covering with dry gauze during the day. Overall patient who is present with her daughter in the clinic today states that the 2 wounds have been improving to the present size. There is also some seepage in and around the wound area. Both legs are swollen. No changes to dimensions of legs overall in the recent past. Patient was remotely seen here in the clinic in 2014 for right lower leg tibial wounds very similar to the ones were seeing today. 8/10-Patient returns after being initiated in the wound clinic last week. The right medial leg wounds about the right ankle are both slightly smaller in size, not much seepage, legs are not as swollen we are using a 3 layer compression of the right juxta lite on the left she denies any significant pain 8/17; the patient has 2 small open areas on the medial right leg. She has good edema control. There is no doubt she has underlying lymphedema. Still a lot of adherent debris to both wounds 8/24; same 2 small open areas on the medial right leg. Edema control. We have been using Iodoflex since last week and the surface of the wound looks some better. 9/14; 2 small openings on the right medial lower leg. The patient went for her venous reflux studies in [place] [facility]. 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 The findings were similar to when examined 2016. There was normal appearance of the great saphenous veins and short saphenous veins bilaterally no evidence for superficial venous reflux and no varicosities. No indication for venous ablation We have been using Iodoflex. Changed to Sorbact today 9/21; 2 small  openings in close juxtaposition on the right medial lower leg. She had difficulties this week with her 3 layer compression becoming too tight she took this off late last Wednesday/Thursday morning. She did not put anything else on her leg quite a bit of nonpitting/lymphedema today still using Sorbact 9/28; 2 small wounds in close juxtaposition in the right medial lower leg. We have been using Sorbact. Changed to endoform today 10/5; 2 small wounds in close juxtaposition the right medial lower leg I changed to endoform last week unfortunately not much improvement Objective Constitutional Patient is hypertensive.. Pulse regular and within target range  for patient.[name] Respirations regular, non-labored and within target range.. Temperature is normal and within the target range for the patient.[name] Appears in no distress. Vitals Time Taken: 3:12 PM, Height: 62 in, Weight: 158 lbs, BMI: 28.9, Temperature: 98.3 F, Pulse: 48 bpm, Respiratory Rate: 16 breaths/min, Blood Pressure: 148/62 mmHg. General Notes: Wound exam; 2 small wounds on the right medial lower leg. Superior one is smaller and more superficial but there is not much change in the inferior one which still requires debridement with a #3 curette. The depth is not come in on this at all. There is really no evidence of surrounding infection Integumentary (Hair, Skin) Wound #5 status is Open. Original cause of wound was Gradually Appeared. The wound is located on the Right,Medial Lower Leg. The wound measures 0.8cm length x 2cm width x 0.2cm depth; 1.257cm^2 area and 0.251cm^3 volume. There is Fat Layer (Subcutaneous Tissue) Exposed exposed. There is no tunneling or undermining noted. There is a small amount of serosanguineous drainage noted. The wound margin is distinct with the outline attached to the wound base. There is large (67-100%) pink, pale granulation within the wound bed. There is a small (1-33%) amount of necrotic tissue within the  wound bed including Adherent Slough. 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 There was a Excisional Skin/Subcutaneous Tissue Debridement with a total area of 1.6 sq cm performed by Maxwell Caul., MD. With the following instrument(s): Curette to remove Viable and Non-Viable tissue/material. Material removed includes Subcutaneous Tissue and Slough and. No specimens were taken. A time out was conducted at 16:02, prior to the start of the procedure. A Minimum amount of bleeding was controlled with Pressure. The procedure was tolerated well with a pain level of 0 throughout and a pain level of 0 following the procedure. Post Debridement Measurements: 0.8cm length x 2cm width x 0.2cm depth; 0.251cm^3 volume. Character of Wound/Ulcer Post Debridement is improved. Post procedure Diagnosis Wound #5: Same as Pre-Procedure Pre-procedure diagnosis of Wound #5 is a Vasculopathy located on the Right,Medial Lower Leg . There was a Three Layer Compression Therapy Procedure by Zandra Abts, RN. Post procedure Diagnosis Wound #5: Same as Pre-Procedure Plan Follow-up Appointments: Return Appointment in 1 week. Dressing Change Frequency: Wound #5 Right,Medial Lower Leg: Do not change entire dressing for one week. Skin Barriers/Peri-Wound Care: Wound #5 Right,Medial Lower Leg: Moisturizing lotion Wound Cleansing: Wound #5 Right,Medial Lower Leg: May shower with protection. Primary Wound Dressing: Wound #5 Right,Medial Lower Leg: Endoform - moisten with saline Secondary Dressing: Wound #5 Right,Medial Lower Leg: Dry Gauze Edema Control: 3 Layer Compression  System - Right Lower Extremity Avoid standing for long periods of time Elevate legs to the level of the heart or above for 30 minutes daily and/or when sitting, a frequency of: - throughout the day Exercise regularly Support Garment 20-30 mm/Hg pressure to: - Juxtalite to left leg 1. Not much change in the wounds. Debridement done still use endoform under compression 2. The patient has chronic venous insufficiency secondary to recurrent DVTs. 3. Her ABIs were noncompressible however her peripheral pulses are palpable. I am doubtful that this is an arterial issue Electronic Signature(s) Signed: 02/08/2019 6:11:18 PM By: Baltazar Najjar MD Entered By: Baltazar Najjar on 02/08/2019  18:02:55 -------------------------------------------------------------------------------- SuperBill Details Patient Name: Date of Service: [name], Lodema D. 02/08/2019 Medical Record [id number] Patient Account Number: [id number] Date of Birth/Sex: Treating RN: 01/09/1931 (85 y.o. [name]) [name], Shatara Primary Care Provider: Alva GarnetShelton, Kimberly R Other Clinician: Karl Itoawkins, Destiny Referring Provider: Treating Provider/Extender:Robson, Gaylene BrooksMichael Shelton, Ivonne AndrewKimberly R Weeks in Treatment: 9 Diagnosis Coding ICD-10 Codes Code Description [phone] Non-pressure chronic ulcer of right ankle with fat layer exposed I89.0 Lymphedema, not elsewhere classified I87.2 Venous insufficiency (chronic) (peripheral) I70.235 Atherosclerosis of native arteries of right leg with ulceration of other part of foot Facility Procedures CPT4 Code Description: [id number] 11042 - DEB SUBQ TISSUE 20 SQ CM/< ICD-10 Diagnosis Description L97.312 Non-pressure chronic ulcer of right ankle with fat layer expo I87.2 Venous insufficiency (chronic) (peripheral) Modifier: sed Quantity: 1 Physician Procedures CPT4 Code Description: [id number] 11042 - WC PHYS SUBQ TISS 20 SQ CM ICD-10 Diagnosis Description L97.312 Non-pressure chronic ulcer of right ankle with  fat layer expo I87.2 Venous insufficiency (chronic) (peripheral) Modifier: sed Quantity: 1 Electronic Signature(s) Signed: 02/08/2019 6:11:18 PM By: Baltazar Najjarobson, Michael MD Entered By: Baltazar Najjarobson, Michael on 02/08/2019 18:03:11

## 2019-02-08 NOTE — Progress Notes (Addendum)
MERRIT, WAUGH ([id number]) Visit Report for 02/08/2019 Arrival Information Details Patient Name: Date of Service: [name], [name] 02/08/2019 2:30 PM Medical Record [id number] Patient Account Number: [id number] Date of Birth/Sex: Treating RN: 1930/11/08 (85 y.o. [name], [name] Primary Care Quynh Basso: Salena Saner Other Clinician: Sandre Kitty Referring Tushar Enns: Treating Dedric Ethington/Extender:Robson, Rhona Leavens, Doristine Locks in Treatment: 9 Visit Information History Since Last Visit Added or deleted any medications: No Patient Arrived: [name] Any new allergies or adverse reactions: No Arrival Time: 15:10 daughter Had a fall or experienced change in No Accompanied By: activities of daily living that may affect Transfer Assistance: None risk of falls: Patient Requires Transmission-Based No Signs or symptoms of abuse/neglect since last No Precautions: visito Patient Has Alerts: No Hospitalized since last visit: No Implantable device outside of the clinic excluding No cellular tissue based products placed in the center since last visit: Has Dressing in Place as Prescribed: Yes Has Compression in Place as Prescribed: Yes Pain Present Now: No Electronic Signature(s) Signed: 02/08/2019 5:53:08 PM By: Deon Pilling Entered By: Deon Pilling on 02/08/2019 15:21:17 -------------------------------------------------------------------------------- Compression Therapy Details Patient Name: Date of Service: [name], [name] 02/08/2019 2:30 PM Medical Record [id number] Patient Account Number: [id number] Date of Birth/Sex: Treating RN: 10-18-1930 (85 y.o. [name] Primary Care Guage Efferson: Salena Saner Other Clinician: Sandre Kitty Referring Berlin Mokry: Treating Ritisha Deitrick/Extender:Robson, Rhona Leavens, Doristine Locks in Treatment: 9 Compression Therapy Performed for Wound Wound #5 Right,Medial Lower Leg Assessment: Performed By: Clinician  Levan Hurst, RN Compression Type: Three Layer Post Procedure Diagnosis Same as Pre-procedure Electronic Signature(s) Signed: 02/08/2019 6:17:12 PM By: Levan Hurst RN, BSN Entered By: Levan Hurst on 02/08/2019 16:04:46 -------------------------------------------------------------------------------- Encounter Discharge Information Details Patient Name: Date of Service: Tami Slade D. 02/08/2019 2:30 PM Medical Record [id number] Patient Account Number: [id number] Date of Birth/Sex: Treating RN: Aug 25, 1930 (85 y.o. [name] Primary Care Jessie Cowher: Salena Saner Other Clinician: Sandre Kitty Referring Patsy Varma: Treating Jaben Benegas/Extender:Robson, Rhona Leavens, Doristine Locks in Treatment: 9 Encounter Discharge Information Items Post Procedure Vitals Discharge Condition: Stable Temperature (F): 98.3 Ambulatory Status: Cane Pulse (bpm): 48 Discharge Destination: Home Respiratory Rate (breaths/min): 16 Transportation: Private Auto Blood Pressure (mmHg): 148/62 Accompanied By: daughter Schedule Follow-up Appointment: Yes Clinical Summary of Care: Electronic Signature(s) Signed: 02/08/2019 5:53:08 PM By: Deon Pilling Entered By: Deon Pilling on 02/08/2019 16:27:58 -------------------------------------------------------------------------------- Lower Extremity Assessment Details Patient Name: Date of Service: [name], [name] 02/08/2019 2:30 PM Medical Record [id number] Patient Account Number: [id number] Date of Birth/Sex: Treating RN: 02-25-1931 (85 y.o. [name], [name] Primary Care Emmelina Mcloughlin: Salena Saner Other Clinician: Sandre Kitty Referring Archer Moist: Treating Esmerelda Finnigan/Extender:Robson, Rhona Leavens, Doristine Locks in Treatment: 9 Edema Assessment Assessed: [Left: No] [Right: Yes] Edema: [Left: Ye] [Right: s] Calf Left: Right: Point of Measurement: 35 cm From Medial Instep cm 31.5 cm Ankle Left: Right: Point of  Measurement: 10 cm From Medial Instep cm 23.5 cm Vascular Assessment Pulses: Dorsalis Pedis Palpable: [Right:Yes] Electronic Signature(s) Signed: 02/08/2019 5:53:08 PM By: Deon Pilling Entered By: Deon Pilling on 02/08/2019 15:22:08 -------------------------------------------------------------------------------- Multi Wound Chart Details Patient Name: Date of Service: Tami Slade D. 02/08/2019 2:30 PM Medical Record [id number] Patient Account Number: [id number] Date of Birth/Sex: Treating RN: 1930-05-25 (85 y.o. [name] Primary Care Reola Buckles: Salena Saner Other Clinician: Sandre Kitty Referring Almetta Liddicoat: Treating Sigismund Cross/Extender:Robson, Rhona Leavens, Doristine Locks in Treatment: 9 Vital Signs Height(in): 62 Pulse(bpm): 48 Weight(lbs): 158 Blood Pressure(mmHg): 148/62 Body Mass Index(BMI): 29 Temperature(F): 98.3 Respiratory 16 Rate(breaths/min):  Photos: [5:No Photos] [N/A:N/A] Wound Location: [5:Right Lower Leg - Medial] [N/A:N/A] Wounding Event: [5:Gradually Appeared] [N/A:N/A] Primary Etiology: [5:Vasculopathy] [N/A:N/A] Comorbid History: [5:Deep Vein Thrombosis, Hypertension] [N/A:N/A] Date Acquired: [5:11/04/2018] [N/A:N/A] Weeks of Treatment: [5:9] [N/A:N/A] Wound Status: [5:Open] [N/A:N/A] Measurements L x W x D 0.8x2x0.2 [N/A:N/A] (cm) Area (cm) : [5:1.257] [N/A:N/A] Volume (cm) : [5:0.251] [N/A:N/A] % Reduction in Area: [5:68.60%] [N/A:N/A] % Reduction in Volume: 68.70% [N/A:N/A] Classification: [5:Full Thickness Without Exposed Support Structures] [N/A:N/A] Exudate Amount: [5:Small] [N/A:N/A N/A] Exudate Type: [5:Serosanguineous] [N/A:N/A N/A] Exudate Color: [5:red, brown] [N/A:N/A N/A] Wound Margin: [5:Distinct, outline attached N/A] [N/A:N/A] Granulation Amount: [5:Large (67-100%)] [N/A:N/A N/A] Granulation Quality: [5:Pink, Pale] [N/A:N/A N/A] Necrotic Amount: [5:Small (1-33%)] [N/A:N/A N/A] Exposed  Structures: [5:Fat Layer (Subcutaneous N/A Tissue) Exposed: Yes Fascia: No Tendon: No Muscle: No Joint: No Bone: No] [N/A:N/A] Epithelialization: [5:Medium (34-66%)] [N/A:N/A N/A] Debridement: [5:Debridement - Excisional N/A] [N/A:N/A] Pre-procedure [5:16:02] [N/A:N/A N/A] Verification/Time Out Taken: Tissue Debrided: [5:Subcutaneous, Slough] [N/A:N/A N/A] Level: [5:Skin/Subcutaneous Tissue] [N/A:N/A N/A] Debridement Area (sq cm):1.6 [N/A:N/A N/A] Instrument: [5:Curette] [N/A:N/A N/A] Bleeding: [5:Minimum] [N/A:N/A N/A] Hemostasis Achieved: [5:Pressure] [N/A:N/A N/A] Procedural Pain: [5:0] [N/A:N/A N/A] Post Procedural Pain: [5:0] [N/A:N/A N/A] Debridement Treatment Procedure was tolerated [N/A:N/A N/A] Response: [5:well] Post Debridement [5:0.8x2x0.2] [N/A:N/A N/A] Measurements L x W x D (cm) Post Debridement [5:0.251] [N/A:N/A N/A] Volume: (cm) Procedures Performed: Compression Therapy [5:Debridement] [N/A:N/A N/A] Treatment Notes Wound #5 (Right, Medial Lower Leg) 1. Cleanse With Wound Cleanser Soap and water 2. Periwound Care Moisturizing lotion 3. Primary Dressing Applied Endoform 4. Secondary Dressing Dry Gauze 6. Support Layer Applied 3 layer compression wrap Notes netting. Electronic Signature(s) Signed: 02/08/2019 6:11:18 PM By: Linton Ham MD Signed: 02/08/2019 6:17:12 PM By: Levan Hurst RN, BSN Entered By: Linton Ham on 02/08/2019 18:00:11 -------------------------------------------------------------------------------- Multi-Disciplinary Care Plan Details Patient Name: Date of Service: DONNEISHA, BEANE 02/08/2019 2:30 PM Medical Record [id number] Patient Account Number: [id number] Date of Birth/Sex: Treating RN: 1930-11-27 (85 y.o. [name] Primary Care Jaimee Corum: Salena Saner Other Clinician: Sandre Kitty Referring Ledarrius Beauchaine: Treating Farha Dano/Extender:Robson, Rhona Leavens, Doristine Locks in Treatment: 9 Active  Inactive Wound/Skin Impairment Nursing Diagnoses: Impaired tissue integrity Knowledge deficit related to ulceration/compromised skin integrity Goals: Patient/caregiver will verbalize understanding of skin care regimen Date Initiated: 12/07/2018 Target Resolution Date: 03/05/2019 Goal Status: Active Ulcer/skin breakdown will have a volume reduction of 30% by week 4 Date Initiated: 12/07/2018 Date Inactivated: 01/18/2019 Target Resolution Date: 01/08/2019 Goal Status: Met Interventions: Assess patient/caregiver ability to obtain necessary supplies Assess patient/caregiver ability to perform ulcer/skin care regimen upon admission and as needed Assess ulceration(s) every visit Provide education on ulcer and skin care Notes: Electronic Signature(s) Signed: 02/08/2019 6:17:12 PM By: Levan Hurst RN, BSN Entered By: Levan Hurst on 02/08/2019 16:06:00 -------------------------------------------------------------------------------- Pain Assessment Details Patient Name: Date of Service: Tami Slade D. 02/08/2019 2:30 PM Medical Record [id number] Patient Account Number: [id number] Date of Birth/Sex: Treating RN: Jul 23, 1930 (85 y.o. [name] Primary Care Larson Limones: Salena Saner Other Clinician: Sandre Kitty Referring Shoua Ressler: Treating Yarixa Lightcap/Extender:Robson, Rhona Leavens, Doristine Locks in Treatment: 9 Active Problems Location of Pain Severity and Description of Pain Patient Has Paino No Site Locations Rate the pain. Current Pain Level: 0 Worst Pain Level: 10 Least Pain Level: 0 Tolerable Pain Level: 7 Pain Management and Medication Current Pain Management: Medication: No Cold Application: No Rest: No Massage: No Activity: No T.E.N.S.: No Heat Application: No Leg drop or elevation: No Is the Current Pain Management Adequate: Adequate How does your wound  impact your activities of daily livingo Sleep: No Bathing: No Appetite: No Relationship  With Others: No Bladder Continence: No Emotions: No Bowel Continence: No Work: No Toileting: No Drive: No Dressing: No Hobbies: No Electronic Signature(s) Signed: 02/08/2019 5:53:08 PM By: Deon Pilling Entered By: Deon Pilling on 02/08/2019 15:21:55 -------------------------------------------------------------------------------- Patient/Caregiver Education Details Patient Name: Date of Service: [name] 10/5/2020andnbsp2:30 PM Medical Record [id number] Patient Account Number: [id number] Date of Birth/Gender: Treating RN: March 08, 1931 (85 y.o. [name] Primary Care Physician: Salena Saner Other Clinician: Sandre Kitty Referring Physician: Treating Physician/Extender:Robson, Rhona Leavens, Doristine Locks in Treatment: 9 Education Assessment Education Provided To: Patient Education Topics Provided Wound/Skin Impairment: Methods: Explain/Verbal Responses: State content correctly [employer]) Signed: 02/08/2019 6:17:12 PM By: Levan Hurst RN, BSN Entered By: Levan Hurst on 02/08/2019 16:06:10 -------------------------------------------------------------------------------- Wound Assessment Details Patient Name: Date of Service: [name], [name] 02/08/2019 2:30 PM Medical Record [id number] Patient Account Number: [id number] Date of Birth/Sex: Treating RN: 02-12-1931 (85 y.o. [name], [name] Primary Care Sherrina Zaugg: Salena Saner Other Clinician: Sandre Kitty Referring Pedrohenrique Mcconville: Treating Maryella Abood/Extender:Robson, Rhona Leavens, Doristine Locks in Treatment: 9 Wound Status Wound Number: 5 Primary Etiology: Vasculopathy Wound Location: Right Lower Leg - Medial Wound Status: Open Wounding Event: Gradually Appeared Comorbid History: Deep Vein Thrombosis, Hypertension Date Acquired: 11/04/2018 Weeks Of Treatment: 9 Clustered Wound: No Photos Wound Measurements Length: (cm) 0.8 % Reduct Width: (cm) 2 %  Reduct Depth: (cm) 0.2 Epitheli Area: (cm) 1.257 Tunneli Volume: (cm) 0.251 Undermi Wound Description Classification: Full Thickness Without Exposed Support Foul Od Structures Slough/ Wound Distinct, outline attached Margin: Exudate Small Amount: Exudate Serosanguineous Type: Exudate red, brown Color: Wound Bed Granulation Amount: Large (67-100%) Granulation Quality: Pink, Pale Fascia Ex Necrotic Amount: Small (1-33%) Fat Layer Necrotic Quality: Adherent Slough Tendon Ex Muscle Ex Joint Exp Bone Expo or After Cleansing: No Fibrino Yes Exposed Structure posed: No (Subcutaneous Tissue) Exposed: Yes posed: No posed: No osed: No sed: No ion in Area: 68.6% ion in Volume: 68.7% alization: Medium (34-66%) ng: No ning: No Treatment Notes Wound #5 (Right, Medial Lower Leg) 1. Cleanse With Wound Cleanser Soap and water 2. Periwound Care Moisturizing lotion 3. Primary Dressing Applied Endoform 4. Secondary Dressing Dry Gauze 6. Support Layer Applied 3 layer compression wrap Notes netting. Electronic Signature(s) Signed: 02/09/2019 12:19:24 PM By: Mikeal Hawthorne EMT/HBOT Signed: 02/10/2019 6:07:42 PM By: Deon Pilling Previous Signature: 02/08/2019 5:53:08 PM Version By: Deon Pilling Entered By: Mikeal Hawthorne on 02/09/2019 10:22:53 -------------------------------------------------------------------------------- Vitals Details Patient Name: Date of Service: Tami Slade D. 02/08/2019 2:30 PM Medical Record [id number] Patient Account Number: [id number] Date of Birth/Sex: Treating RN: December 15, 1930 (85 y.o. [name], [name] Primary Care Adilene Areola: Salena Saner Other Clinician: Sandre Kitty Referring Minard Millirons: Treating Rhyker Silversmith/Extender:Robson, Rhona Leavens, Doristine Locks in Treatment: 9 Vital Signs Time Taken: 15:12 Temperature (F): 98.3 Height (in): 62 Pulse (bpm): 48 Weight (lbs): 158 Respiratory Rate (breaths/min): 16 Body Mass  Index (BMI): 28.9 Blood Pressure (mmHg): 148/62 Reference Range: 80 - 120 mg / dl Electronic Signature(s) Signed: 02/08/2019 5:53:08 PM By: Deon Pilling Entered By: Deon Pilling on 02/08/2019 15:21:37

## 2019-02-11 NOTE — Progress Notes (Signed)
BRAYLA, PAT ([id number]) Visit Report for 02/01/2019 Arrival Information Details Patient Name: Date of Service: [name], [name] 02/01/2019 2:15 PM Medical Record [id number] Patient Account Number: [id number] Date of Birth/Sex: Treating RN: 1930/09/09 (85 y.o. [name], [name] Primary Care Kj Imbert: Salena Saner Other Clinician: Referring Lulie Hurd: Treating Robecca Fulgham/Extender:Robson, Rhona Leavens, Doristine Locks in Treatment: 8 Visit Information History Since Last Visit Added or deleted any medications: No Patient Arrived: [name] Any new allergies or adverse reactions: No Arrival Time: 15:02 daughter Had a fall or experienced change in No Accompanied By: activities of daily living that may affect Transfer Assistance: None risk of falls: Patient Identification Verified: Yes Signs or symptoms of abuse/neglect since last No Secondary Verification Process Completed: Yes visito Patient Requires Transmission-Based No Hospitalized since last visit: No Precautions: Implantable device outside of the clinic excluding No Patient Has Alerts: No cellular tissue based products placed in the center since last visit: Has Dressing in Place as Prescribed: Yes Has Compression in Place as Prescribed: Yes Pain Present Now: No Electronic Signature(s) Signed: 02/11/2019 4:32:31 PM By: Kela Millin Entered By: Kela Millin on 02/01/2019 15:02:58 -------------------------------------------------------------------------------- Compression Therapy Details Patient Name: Date of Service: Tami Slade D. 02/01/2019 2:15 PM Medical Record [id number] Patient Account Number: [id number] Date of Birth/Sex: Treating RN: [birth year] (85 y.o. [name] Primary Care Franshesca Chipman: Salena Saner Other Clinician: Referring Beckem Tomberlin: Treating Jason Frisbee/Extender:Robson, Rhona Leavens, Doristine Locks in Treatment: 8 Compression Therapy Performed for Wound Wound #5  Right,Medial Lower Leg Assessment: Performed By: Clinician Levan Hurst, RN Compression Type: Three Layer Post Procedure Diagnosis Same as Pre-procedure Electronic Signature(s) Signed: 02/01/2019 6:25:06 PM By: Levan Hurst RN, BSN Entered By: Levan Hurst on 02/01/2019 16:32:25 -------------------------------------------------------------------------------- Encounter Discharge Information Details Patient Name: Date of Service: Tami Slade D. 02/01/2019 2:15 PM Medical Record [id number] Patient Account Number: [id number] Date of Birth/Sex: Treating RN: 1931/04/11 (85 y.o. [name] Primary Care Yaritza Leist: Salena Saner Other Clinician: Referring Thaddeus Evitts: Treating Davia Smyre/Extender:Robson, Rhona Leavens, Doristine Locks in Treatment: 8 Encounter Discharge Information Items Post Procedure Vitals Discharge Condition: Stable Temperature (F): 97.8 Ambulatory Status: Cane Pulse (bpm): 55 Discharge Destination: Home Respiratory Rate (breaths/min): 18 Transportation: Private Auto Blood Pressure (mmHg): 150/67 Accompanied By: daughter Schedule Follow-up Appointment: Yes Clinical Summary of Care: Electronic Signature(s) Signed: 02/01/2019 5:51:45 PM By: Deon Pilling Entered By: Deon Pilling on 02/01/2019 17:46:16 -------------------------------------------------------------------------------- Lower Extremity Assessment Details Patient Name: Date of Service: [name], [name] 02/01/2019 2:15 PM Medical Record [id number] Patient Account Number: [id number] Date of Birth/Sex: Treating RN: [birth year] (85 y.o. [name] Primary Care Dwanna Goshert: Salena Saner Other Clinician: Referring Tayllor Breitenstein: Treating Elvis Boot/Extender:Robson, Rhona Leavens, Doristine Locks in Treatment: 8 Edema Assessment Assessed: [Left: No] [Right: No] Edema: [Left: Ye] [Right: s] Calf Left: Right: Point of Measurement: 35 cm From Medial Instep cm 34.8  cm Ankle Left: Right: Point of Measurement: 10 cm From Medial Instep cm 24.5 cm Vascular Assessment Pulses: Dorsalis Pedis Palpable: [Right:Yes] Electronic Signature(s) Signed: 02/11/2019 4:32:31 PM By: Kela Millin Entered By: Kela Millin on 02/01/2019 15:09:22 -------------------------------------------------------------------------------- Multi Wound Chart Details Patient Name: Date of Service: Tami Slade D. 02/01/2019 2:15 PM Medical Record [id number] Patient Account Number: [id number] Date of Birth/Sex: Treating RN: 12/21/1930 (85 y.o. [name] Primary Care Davonta Stroot: Salena Saner Other Clinician: Referring Arjun Hard: Treating Jenin Birdsall/Extender:Robson, Rhona Leavens, Doristine Locks in Treatment: 8 Vital Signs Height(in): 62 Pulse(bpm): 55 Weight(lbs): 158 Blood Pressure(mmHg): 150/67 Body Mass Index(BMI): 29 Temperature(F): 97.8 Respiratory 18 Rate(breaths/min): Photos: [  5:No Photos] [N/A:N/A] Wound Location: [5:Right Lower Leg - Medial] [N/A:N/A] Wounding Event: [5:Gradually Appeared] [N/A:N/A] Primary Etiology: [5:Vasculopathy] [N/A:N/A] Comorbid History: [5:Deep Vein Thrombosis, Hypertension] [N/A:N/A] Date Acquired: [5:11/04/2018] [N/A:N/A] Weeks of Treatment: [5:8] [N/A:N/A] Wound Status: [5:Open] [N/A:N/A] Measurements L x W x D 1x2.3x0.2 [N/A:N/A] (cm) Area (cm) : [5:1.806] [N/A:N/A] Volume (cm) : [5:0.361] [N/A:N/A] % Reduction in Area: [5:54.90%] [N/A:N/A] % Reduction in Volume: 54.90% [N/A:N/A] Classification: [5:Full Thickness Without Exposed Support Structures] [N/A:N/A] Exudate Amount: [5:Small] [N/A:N/A] Exudate Type: [5:Serosanguineous] [N/A:N/A] Exudate Color: [5:red, brown] [N/A:N/A] Wound Margin: [5:Distinct, outline attached N/A] Granulation Amount: [5:Medium (34-66%)] [N/A:N/A] Granulation Quality: [5:Pink, Pale] [N/A:N/A] Necrotic Amount: [5:Medium (34-66%)] [N/A:N/A] Exposed Structures: [5:Fat Layer  (Subcutaneous N/A Tissue) Exposed: Yes Fascia: No Tendon: No Muscle: No Joint: No Bone: No Small (1-33%)] [N/A:N/A] Treatment Notes Electronic Signature(s) Signed: 02/01/2019 6:01:32 PM By: Linton Ham MD Signed: 02/01/2019 6:25:06 PM By: Levan Hurst RN, BSN Entered By: Linton Ham on 02/01/2019 15:56:13 -------------------------------------------------------------------------------- Multi-Disciplinary Care Plan Details Patient Name: Date of Service: Tami Slade D. 02/01/2019 2:15 PM Medical Record [id number] Patient Account Number: [id number] Date of Birth/Sex: Treating RN: May 08, 1930 (85 y.o. [name] Primary Care Rieley Hausman: Salena Saner Other Clinician: Referring Tujuana Kilmartin: Treating Arlie Posch/Extender:Robson, Rhona Leavens, Doristine Locks in Treatment: 8 Active Inactive Wound/Skin Impairment Nursing Diagnoses: Impaired tissue integrity Knowledge deficit related to ulceration/compromised skin integrity Goals: Patient/caregiver will verbalize understanding of skin care regimen Date Initiated: 12/07/2018 Target Resolution Date: 03/05/2019 Goal Status: Active Ulcer/skin breakdown will have a volume reduction of 30% by week 4 Date Initiated: 12/07/2018 Date Inactivated: 01/18/2019 Target Resolution Date: 01/08/2019 Goal Status: Met Interventions: Assess patient/caregiver ability to obtain necessary supplies Assess patient/caregiver ability to perform ulcer/skin care regimen upon admission and as needed Assess ulceration(s) every visit Provide education on ulcer and skin care Notes: Electronic Signature(s) Signed: 02/01/2019 6:25:06 PM By: Levan Hurst RN, BSN Entered By: Levan Hurst on 02/01/2019 17:56:35 -------------------------------------------------------------------------------- Pain Assessment Details Patient Name: Date of Service: Tami Slade D. 02/01/2019 2:15 PM Medical Record [id number] Patient Account Number: [id number] Date of  Birth/Sex: Treating RN: June 17, 1930 (85 y.o. [name] Primary Care Koralynn Greenspan: Salena Saner Other Clinician: Referring Velda Wendt: Treating Rhylen Pulido/Extender:Robson, Rhona Leavens, Doristine Locks in Treatment: 8 Active Problems Location of Pain Severity and Description of Pain Patient Has Paino No Site Locations Pain Management and Medication Current Pain Management: Electronic Signature(s) Signed: 02/11/2019 4:32:31 PM By: Kela Millin Entered By: Kela Millin on 02/01/2019 15:08:31 -------------------------------------------------------------------------------- Patient/Caregiver Education Details Patient Name: Date of Service: [name] 9/28/2020andnbsp2:15 PM Medical Record [id number] Patient Account Number: [id number] Date of Birth/Gender: Treating RN: 1930-09-09 (85 y.o. [name] Primary Care Physician: Salena Saner Other Clinician: Referring Physician: Treating Physician/Extender:Robson, Rhona Leavens, Doristine Locks in Treatment: 8 Education Assessment Education Provided To: Patient Education Topics Provided Wound/Skin Impairment: Methods: Explain/Verbal Responses: State content correctly Electronic Signature(s) Signed: 02/01/2019 6:25:06 PM By: Levan Hurst RN, BSN Entered By: Levan Hurst on 02/01/2019 16:30:31 -------------------------------------------------------------------------------- Wound Assessment Details Patient Name: Date of Service: Tami Slade D. 02/01/2019 2:15 PM Medical Record [id number] Patient Account Number: [id number] Date of Birth/Sex: Treating RN: 1931-03-19 (85 y.o. [name] Primary Care Efrain Clauson: Salena Saner Other Clinician: Referring Jonesha Tsuchiya: Treating Remijio Holleran/Extender:Robson, Rhona Leavens, Doristine Locks in Treatment: 8 Wound Status Wound Number: 5 Primary Etiology: Vasculopathy Wound Location: Right Lower Leg - Medial Wound Status:  Open Wounding Event: Gradually Appeared Comorbid History: Deep Vein Thrombosis, Hypertension Date Acquired: 11/04/2018 Weeks Of Treatment: 8 Clustered Wound:  No Photos Wound Measurements Length: (cm) 1 % Reduct Width: (cm) 2.3 % Reduct Depth: (cm) 0.2 Epitheli Area: (cm) 1.806 Tunneli Volume: (cm) 0.361 Undermi Wound Description Full Thickness Without Exposed Support Foul Od Classification: Structures Slough/ Wound Distinct, outline attached Margin: Exudate Small Amount: Exudate Serosanguineous Type: Exudate red, brown Color: Wound Bed Granulation Amount: Medium (34-66%) Granulation Quality: Pink, Pale Fascia Necrotic Amount: Medium (34-66%) Fat Lay Necrotic Quality: Adherent Slough Tendon Muscle Joint E Bone Ex or After Cleansing: No Fibrino Yes Exposed Structure Exposed: No er (Subcutaneous Tissue) Exposed: Yes Exposed: No Exposed: No xposed: No posed: No ion in Area: 54.9% ion in Volume: 54.9% alization: Small (1-33%) ng: No ning: No Electronic Signature(s) Signed: 02/03/2019 8:44:12 AM By: Mikeal Hawthorne EMT/HBOT Signed: 02/11/2019 4:32:31 PM By: Kela Millin Entered By: Mikeal Hawthorne on 02/02/2019 09:08:57 -------------------------------------------------------------------------------- Vitals Details Patient Name: Date of Service: Tami Slade D. 02/01/2019 2:15 PM Medical Record [id number] Patient Account Number: [id number] Date of Birth/Sex: Treating RN: [birth year] (85 y.o. [name] Primary Care Lynia Landry: Salena Saner Other Clinician: Referring Avarae Zwart: Treating Cozy Veale/Extender:Robson, Rhona Leavens, Doristine Locks in Treatment: 8 Vital Signs Time Taken: 15:02 Temperature (F): 97.8 Height (in): 62 Pulse (bpm): 55 Weight (lbs): 158 Respiratory Rate (breaths/min): 18 Body Mass Index (BMI): 28.9 Blood Pressure (mmHg): 150/67 Reference Range: 80 - 120 mg / dl Electronic Signature(s) Signed: 02/11/2019  4:32:31 PM By: Kela Millin Entered By: Kela Millin on 02/01/2019 15:03:56

## 2019-02-15 ENCOUNTER — Other Ambulatory Visit: Payer: Self-pay

## 2019-02-15 ENCOUNTER — Encounter (HOSPITAL_BASED_OUTPATIENT_CLINIC_OR_DEPARTMENT_OTHER): Payer: Medicare Other | Admitting: Internal Medicine

## 2019-02-15 DIAGNOSIS — L97812 Non-pressure chronic ulcer of other part of right lower leg with fat layer exposed: Secondary | ICD-10-CM | POA: Diagnosis not present

## 2019-02-15 NOTE — Progress Notes (Signed)
Tami, Landry ([id number]) Visit Report for 02/15/2019 HPI Details Patient Name: Date of Service: [name], [name] 02/15/2019 10:00 AM Medical Record [id number] Patient Account Number: [id number] Date of Birth/Sex: Treating RN: 10/24/1930 (85 y.o. 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 Patient was given a course of doxycycline for 7 days that she completed, has been putting mupirocin cream on the wounds, leaving it exposed at night and covering with dry gauze during the day. Overall patient who is present with her daughter in the clinic today states that the 2 wounds have been improving to the present size. There is also some seepage in and around the wound area. Both legs are swollen. No changes to dimensions of legs overall in the recent past. Patient was remotely seen here in the clinic in 2014 for right lower leg tibial wounds very similar to the ones were seeing today. 8/10-Patient returns after being initiated in the wound clinic last week. The right medial leg wounds about the right ankle are both slightly smaller in size, not much seepage, legs are not as swollen we are using a 3 layer compression of the right juxta lite on the left she denies any significant pain 8/17; the patient has 2 small open areas on the medial right leg. She has good edema control. There is no doubt she has underlying lymphedema. Still a lot of adherent debris to both wounds 8/24; same 2 small open areas on the medial right leg. Edema  control. We have been using Iodoflex since last week and the surface of the wound looks some better. 9/14; 2 small openings on the right medial lower leg. The patient went for her venous reflux studies in [facility]. 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 The findings were similar to when examined 2016. There was normal appearance of the great saphenous veins and short saphenous veins bilaterally no evidence for superficial venous reflux and no varicosities. No indication for venous ablation We have been using Iodoflex. Changed to Sorbact today 9/21; 2 small openings in close juxtaposition on the right medial lower leg. She had difficulties this week with her 3 layer compression becoming too tight she took this off late last Wednesday/Thursday morning. She did not put anything else on her leg quite a bit of nonpitting/lymphedema today still using Sorbact 9/28; 2 small wounds in close juxtaposition in the right medial lower leg. We have been using Sorbact. Changed to endoform today 10/5; 2 small wounds in close juxtaposition the right medial lower leg I changed to endoform last week unfortunately not much improvement 10/12; 2 small wounds in close juxtaposition on the right lower leg in the setting of severe chronic venous insufficiency secondary to prior DVTs. Electronic Signature(s) Signed: 02/15/2019 5:50:55 PM By: Baltazar Najjar MD Entered By: Baltazar Najjar on 02/15/2019 12:49:14 -------------------------------------------------------------------------------- Physical Exam Details Patient Name: Date of Service: Tami Comber D. 02/15/2019 10:00 AM Medical Record [id number] Patient Account Number: [id number] Date of Birth/Sex: Treating RN: [birth year] (85 y.o. [name] Primary Care Provider: Alva Garnet Other Clinician: Karl Ito Referring Provider: Treating Provider/Extender:Banjamin Stovall,  Gaylene Maroney, Merlene Laughter Weeks in Treatment: 10 Constitutional Sitting or standing Blood Pressure is within target range for patient.. Pulse regular and within target range for patient.[name] Respirations regular, non-labored and within target range.[name] Appears in no distress. Eyes Conjunctivae have some redness. Mild discharge noted.. Cardiovascular Pedal pulses are palpable on the right. Notes Wound exam; 2 small wounds on the right medial lower leg. The superior 1 I think is just about closed. The inferior one appears to have somewhat less depth. No evidence of surrounding infection. No mechanical debridement today Electronic Signature(s) Signed: 02/15/2019 5:50:55 PM By: Baltazar Najjar MD Entered By: Baltazar Najjar on 02/15/2019 12:51:38 -------------------------------------------------------------------------------- Physician Orders Details Patient Name: Date of Service: Tami Comber D. 02/15/2019 10:00 AM Medical Record [id number] Patient Account Number: [id number] Date of Birth/Sex: Treating RN: 1931/02/11 (85 y.o. Harvest Dark Primary Care Provider: Alva Garnet Other Clinician: Karl Ito Referring Provider: Treating Provider/Extender:Rayya Yagi, Gaylene Heatherington, Ivonne Andrew in Treatment: 10 Verbal / Phone Orders: No Diagnosis Coding Follow-up Appointments Return Appointment in 1 week. Dressing Change Frequency Wound #5 Right,Medial Lower Leg Do not change entire dressing for one week. Skin Barriers/Peri-Wound Care Wound #5 Right,Medial Lower Leg Moisturizing lotion Wound Cleansing Wound #5 Right,Medial Lower Leg May shower with protection. Primary Wound Dressing Wound #5 Right,Medial Lower Leg Endoform - moisten with saline Secondary Dressing Wound #5 Right,Medial Lower Leg Dry Gauze Drawtex Edema Control 3 Layer Compression System - Right Lower Extremity Avoid standing for long periods of time Elevate legs to the level of the  heart or above for 30 minutes daily and/or when sitting, a frequency of: - throughout the day Exercise regularly Support Garment 20-30 mm/Hg pressure to: - Juxtalite to left leg Electronic Signature(s) Signed: 02/15/2019 5:18:03 PM By: Cherylin Mylar Signed: 02/15/2019 5:50:55 PM By: Baltazar Najjar MD Entered By: Cherylin Mylar on 02/15/2019 10:51:21 -------------------------------------------------------------------------------- Problem List Details Patient Name: Date of Service: Tami Comber D. 02/15/2019 10:00 AM Medical Record [id number] Patient Account Number: [id number] Date of Birth/Sex: Treating RN: 1930/07/08 (85 y.o. [name] Primary Care Provider: Alva Garnet Other Clinician: Karl Ito Referring Provider: Treating Provider/Extender:Ardath Lepak, Gaylene Tinner, Ivonne Andrew in Treatment: 10 Active Problems ICD-10 Evaluated Encounter Code Description Active Date Today Diagnosis L97.312 Non-pressure chronic ulcer of right ankle with fat layer 12/07/2018 No Yes exposed I89.0 Lymphedema, not elsewhere classified 12/21/2018 No Yes I87.2 Venous insufficiency (chronic) (peripheral) 12/07/2018 No Yes I70.235 Atherosclerosis of native arteries of right leg with 02/01/2019 No Yes ulceration of other part of foot Inactive Problems Resolved Problems Electronic Signature(s) Signed: 02/15/2019 5:50:55 PM By: Baltazar Najjar MD Entered By: Baltazar Najjar on 02/15/2019 12:46:21 -------------------------------------------------------------------------------- Progress Note Details Patient Name: Date of Service: Tami Comber D. 02/15/2019 10:00 AM Medical Record [id number] Patient Account Number: [id number] Date of Birth/Sex: Treating RN: 1930/06/09 (85 y.o. [name] Primary Care Provider: Alva Garnet Other Clinician: Karl Ito Referring Provider: Treating Provider/Extender:Shawnese Magner, Gaylene Kinsella, Ivonne Andrew in  Treatment: 10 Subjective History of Present Illness (HPI) Pleasant 85 year old African-American female with history of bilateral DVTs with presumed history of hypercoagulable state, with venous insufficiency as a result, presenting with 3-week onset of right medial leg wounds just above the right ankle of 3 weeks duration. Patient was given a course of doxycycline for 7 days that she completed, has been putting mupirocin cream on the wounds, leaving it exposed at night and covering with dry gauze during the day. Overall patient who is present with her daughter in the clinic  today states that the 2 wounds have been improving to the present size. There is also some seepage in and around the wound area. Both legs are swollen. No changes to dimensions of legs overall in the recent past. Patient was remotely seen here in the clinic in 2014 for right lower leg tibial wounds very similar to the ones were seeing today. 8/10-Patient returns after being initiated in the wound clinic last week. The right medial leg wounds about the right ankle are both slightly smaller in size, not much seepage, legs are not as swollen we are using a 3 layer compression of the right juxta lite on the left she denies any significant pain 8/17; the patient has 2 small open areas on the medial right leg. She has good edema control. There is no doubt she has underlying lymphedema. Still a lot of adherent debris to both wounds 8/24; same 2 small open areas on the medial right leg. Edema control. We have been using Iodoflex since last week and the surface of the wound looks some better. 9/14; 2 small openings on the right medial lower leg. The patient went for her venous reflux studies in [facility]. 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 The findings were similar to when examined 2016. There was normal appearance of the great saphenous veins and short  saphenous veins bilaterally no evidence for superficial venous reflux and no varicosities. No indication for venous ablation We have been using Iodoflex. Changed to Sorbact today 9/21; 2 small openings in close juxtaposition on the right medial lower leg. She had difficulties this week with her 3 layer compression becoming too tight she took this off late last Wednesday/Thursday morning. She did not put anything else on her leg quite a bit of nonpitting/lymphedema today still using Sorbact 9/28; 2 small wounds in close juxtaposition in the right medial lower leg. We have been using Sorbact. Changed to endoform today 10/5; 2 small wounds in close juxtaposition the right medial lower leg I changed to endoform last week unfortunately not much improvement 10/12; 2 small wounds in close juxtaposition on the right lower leg in the setting of severe chronic venous insufficiency secondary to prior DVTs. Objective Constitutional Sitting or standing Blood Pressure is within target range for patient.. Pulse regular and within target range for patient.[name] Respirations regular, non-labored and within target range.[name] Appears in no distress. Vitals Time Taken: 10:17 AM, Height: 62 in, Weight: 158 lbs, BMI: 28.9, Temperature: 97.9 F, Pulse: 53 bpm, Respiratory Rate: 16 breaths/min, Blood Pressure: 139/54 mmHg. Eyes Conjunctivae have some redness. Mild discharge noted.. Cardiovascular Pedal pulses are palpable on the right. General Notes: Wound exam; 2 small wounds on the right medial lower leg. The superior 1 I think is just about closed. The inferior one appears to have somewhat less depth. No evidence of surrounding infection. No mechanical debridement today Integumentary (Hair, Skin) Wound #5 status is Open. Original cause of wound was Gradually Appeared. The wound is located on the Right,Medial Lower Leg. The wound measures 0.7cm length x 0.5cm width x 0.2cm depth; 0.275cm^2 area and 0.055cm^3 volume.  There is Fat Layer (Subcutaneous Tissue) Exposed exposed. There is no tunneling or undermining noted. There is a small amount of serosanguineous drainage noted. The wound margin is distinct with the outline attached to the wound base. There is large (67-100%) pink, pale granulation within the wound bed. There is no necrotic tissue within the wound bed. 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 There was a Three Layer Compression Therapy Procedure by Cherylin Mylarwiggins, Shannon, RN. Post procedure Diagnosis Wound #5: Same as Pre-Procedure Plan Follow-up Appointments: Return Appointment in 1 week. Dressing Change Frequency: Wound #5 Right,Medial Lower Leg: Do not change entire dressing for one week. Skin Barriers/Peri-Wound Care: Wound #5 Right,Medial Lower Leg: Moisturizing lotion Wound Cleansing: Wound #5 Right,Medial Lower Leg: May shower with protection. Primary Wound Dressing: Wound #5 Right,Medial Lower Leg: Endoform - moisten with saline Secondary Dressing: Wound #5 Right,Medial Lower Leg: Dry Gauze Drawtex Edema Control: 3 Layer Compression System - Right Lower Extremity Avoid standing for long periods of time Elevate legs to the level of the heart or above for 30 minutes daily and/or when sitting, a frequency of: - throughout the day Exercise regularly Support Garment 20-30 mm/Hg pressure to: - Juxtalite to left leg 1. Continue with endoform and 3 layer compression. 2. She has a juxta lite stocking for the left leg and has 1 for the right leg when the wounds are closed. She wanted to use her juxta lite stocking on the right, I counseled against  that today Electronic Signature(s) Signed: 02/15/2019 5:50:55 PM By: Baltazar Najjarobson, Amer Alcindor MD Entered By: Baltazar Najjarobson, Nyron Mozer on 02/15/2019 12:52:52 -------------------------------------------------------------------------------- SuperBill Details Patient Name: Date of Service: Berniece SalinesBROOKS, Shyanne D. 02/15/2019 Medical Record [id number] Patient Account Number: [id number] Date of Birth/Sex: Treating RN: 03/18/1931 (85 y.o. Harvest DarkF) Dwiggins, Shannon Primary Care Provider: Alva GarnetShelton, Kimberly R Other Clinician: Karl Itoawkins, Destiny Referring Provider: Treating Provider/Extender:Sumedha Munnerlyn, Gaylene BrooksMichael Shelton, Ivonne AndrewKimberly R Weeks in Treatment: 10 Diagnosis Coding ICD-10 Codes Code Description [phone] Non-pressure chronic ulcer of right ankle with fat layer exposed I89.0 Lymphedema, not elsewhere classified I87.2 Venous insufficiency (chronic) (peripheral) I70.235 Atherosclerosis of native arteries of right leg with ulceration of other part of foot Facility Procedures CPT4 Code Description: [id number] (Facility Use Only) [phone] - APPLY MULTLAY COMPRS LWR RT LEG Modifier: Quantity: 1 Physician Procedures CPT4 Code Description: [id number] - WC PHYS LEVEL 3 - EST PT ICD-10 Diagnosis Description L97.312 Non-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

## 2019-02-22 ENCOUNTER — Encounter (HOSPITAL_BASED_OUTPATIENT_CLINIC_OR_DEPARTMENT_OTHER): Payer: Medicare Other | Admitting: Internal Medicine

## 2019-02-22 ENCOUNTER — Other Ambulatory Visit: Payer: Self-pay

## 2019-02-22 DIAGNOSIS — L97812 Non-pressure chronic ulcer of other part of right lower leg with fat layer exposed: Secondary | ICD-10-CM | POA: Diagnosis not present

## 2019-02-24 NOTE — Progress Notes (Signed)
[name], Tami D. ([id number]) Visit Report for 02/22/2019 Debridement Details Patient Name: Date of Service: [name], Tami D. 02/22/2019 9:00 AM Medical Record [name] Patient Account Number: [id number] Date of Birth/Sex: Treating RN: 12/30/1930 (85 y.o. [name]) [name], Shatara Primary Care Provider: Alva GarnetShelton, Kimberly R Other Clinician: Referring Provider: Treating Provider/Extender:Zamar Odwyer, Gaylene BrooksMichael Shelton, Ivonne AndrewKimberly R Weeks in Treatment: 11 Debridement Performed for Wound #5 Right,Medial Lower Leg Assessment: Performed By: Physician Maxwell Caulobson, Ehren Berisha G., MD Debridement Type: Debridement Level of Consciousness (Pre- Awake and Alert procedure): Pre-procedure Yes - 10:34 Verification/Time Out Taken: Start Time: 10:34 Pain Control: Lidocaine 4% Topical Solution Total Area Debrided (L x W): 0.7 (cm) x 0.5 (cm) = 0.35 (cm) Tissue and other material Viable, Non-Viable, Slough, Subcutaneous, Slough debrided: Level: Skin/Subcutaneous Tissue Debridement Description: Excisional Instrument: Curette Bleeding: Minimum Hemostasis Achieved: Pressure End Time: 10:36 Procedural Pain: 4 Post Procedural Pain: 2 Response to Treatment: Procedure was tolerated well Level of Consciousness Awake and Alert (Post-procedure): Post Debridement Measurements of Total Wound Length: (cm) 0.7 Width: (cm) 0.5 Depth: (cm) 0.3 Volume: (cm) 0.082 Character of Wound/Ulcer Post Improved Debridement: Post Procedure Diagnosis Same as Pre-procedure Electronic Signature(s) Signed: 02/22/2019 5:54:03 PM By: Baltazar Najjarobson, Myrikal Messmer MD Signed: 02/24/2019 6:50:57 PM By: Zandra AbtsLynch, Shatara RN, BSN Entered By: Baltazar Najjarobson, Jubal Rademaker on 02/22/2019 11:40:48 -------------------------------------------------------------------------------- HPI Details Patient Name: Date of Service: [name], Tami D. 02/22/2019 9:00 AM Medical Record [id number] Patient Account Number: [id number] Date of Birth/Sex: Treating RN: 11/15/1930 (85 y.o.  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 Patient was given a course of doxycycline for 7 days that she completed, has been putting mupirocin cream on the wounds, leaving it exposed at night and covering with dry gauze during the day. Overall patient who is present with her daughter in the clinic today states that the 2 wounds have been improving to the present size. There is also some seepage in and around the wound area. Both legs are swollen. No changes to dimensions of legs overall in the recent past. Patient was remotely seen here in the clinic in 2014 for right lower leg tibial wounds very similar to the ones were seeing today. 8/10-Patient returns after being initiated in the wound clinic last week. The right medial leg wounds about the right ankle are both slightly smaller in size, not much seepage, legs are not as swollen we are using a 3 layer compression of the right juxta lite on the left she denies any significant pain 8/17; the patient has 2 small open areas on the medial right leg. She has good edema control. There is no doubt she has underlying lymphedema. Still a lot of adherent debris to both wounds 8/24; same 2 small open areas on the medial right leg. Edema control. We have been using Iodoflex since last week and the surface of the wound looks some better. 9/14; 2 small openings on the right medial lower leg. The [place] 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 The findings were similar to when examined 2016. There was normal appearance of the great saphenous veins and short  saphenous veins bilaterally no evidence for superficial venous reflux and no varicosities. No indication for venous ablation We have been using Iodoflex. Changed to Sorbact today 9/21; 2 small openings in close juxtaposition on the right medial lower leg. She had difficulties this week with her 3 layer compression becoming too tight she took this off late last Wednesday/Thursday morning. She did not put anything else on her leg quite a bit of nonpitting/lymphedema today still using Sorbact 9/28; 2 small wounds in close juxtaposition in the right medial lower leg. We have been using Sorbact. Changed to endoform today 10/5; 2 small wounds in close juxtaposition the right medial lower leg I changed to endoform last week unfortunately not much improvement 10/12; 2 small wounds in close juxtaposition on the right lower leg in the setting of severe chronic venous insufficiency secondary to prior DVTs. 10/19; 2 small wounds in close juxtaposition in the right lower leg in the setting of severe chronic venous insufficiency secondary to prior DVTs. The more anterior one is closed however the more posterior one still has not changed that much. Considerable depth. We have been using endoform Electronic Signature(s) Signed: 02/22/2019 5:54:03 PM By: Baltazar Najjar MD Entered By: Baltazar Najjar on 02/22/2019 11:41:52 -------------------------------------------------------------------------------- Physical Exam Details Patient Name: Date of Service: Tami Comber D. 02/22/2019 9:00 AM Medical Record [id number] Patient Account Number: [id number] Date of Birth/Sex: Treating RN: [birth year] (85 y.o. Wynelle Link Primary Care Provider: Alva Garnet Other Clinician: Referring Provider: Treating Provider/Extender:Marisabel Macpherson,  Gaylene Caldwell, Ivonne Andrew in Treatment: 11 Constitutional Sitting or standing Blood Pressure is within target range for patient.. Pulse regular and within target range for patient.[name] Respirations regular, non-labored and within target range.. Temperature is normal and within the target range for the patient.[name] Appears in no distress. Notes Wound exam; 2 small wounds on the right medial lower leg superior 1 I think is fully closed. The inferior 1 unfortunately about the same. Still requiring debridement with a #3 curette to remove adherent necrotic debris. No evidence of surrounding infection. I am able to get to a fairly decent-looking wound surface but the granulation is certainly not filling in Electronic Signature(s) Signed: 02/22/2019 5:54:03 PM By: Baltazar Najjar MD Entered By: Baltazar Najjar on 02/22/2019 11:42:39 -------------------------------------------------------------------------------- Physician Orders Details Patient Name: Date of Service: Tami Comber D. 02/22/2019 9:00 AM Medical Record [id number] Patient Account Number: [id number] Date of Birth/Sex: Treating RN: 1931-03-30 (85 y.o. Wynelle Link Primary Care Provider: Alva Garnet Other Clinician: Referring Provider: Treating Provider/Extender:Amaru Burroughs, Gaylene Nila, Ivonne Andrew in Treatment: 11 Verbal / Phone Orders: No Diagnosis Coding ICD-10 Coding Code Description L97.312 Non-pressure chronic ulcer of right ankle with fat layer exposed I89.0 Lymphedema, not elsewhere classified I87.2 Venous insufficiency (chronic) (peripheral) I70.235 Atherosclerosis of native arteries of right leg with ulceration of other part of foot Follow-up Appointments Return Appointment in 1 week. Dressing Change Frequency Wound #5 Right,Medial Lower Leg Do not change entire dressing for one week. Skin Barriers/Peri-Wound Care Wound #5 Right,Medial Lower Leg Moisturizing lotion Wound  Cleansing Wound #5 Right,Medial Lower Leg May shower with protection. Primary Wound Dressing Wound #5 Right,Medial Lower Leg Endoform - moisten with saline Secondary Dressing Wound #5 Right,Medial Lower Leg Dry Gauze Edema Control 3 Layer Compression System - Right Lower Extremity Avoid standing for long periods of time Elevate legs to the level of the heart or above for 30 minutes daily and/or when sitting, a frequency of: - throughout the day Exercise regularly Support Garment 20-30 mm/Hg  pressure to: - Juxtalite to left leg Electronic Signature(s) Signed: 02/22/2019 5:54:03 PM By: Linton Ham MD Signed: 02/24/2019 6:50:57 PM By: Levan Hurst RN, BSN Entered By: Levan Hurst on 02/22/2019 10:40:34 -------------------------------------------------------------------------------- Problem List Details Patient Name: Date of Service: [name], Tami D. 02/22/2019 9:00 AM Medical Record [id number] Patient Account Number: [id number] Date of Birth/Sex: Treating RN: Oct 30, 1930 (85 y.o. [name] Primary Care Provider: Salena Saner Other Clinician: Referring Provider: Treating Provider/Extender:Camarie Mctigue, Rhona Leavens, Doristine Locks in Treatment: 11 Active Problems ICD-10 Evaluated Encounter Code Description Active Date Today Diagnosis L97.312 Non-pressure chronic ulcer of right ankle with fat layer 12/07/2018 No Yes exposed I89.0 Lymphedema, not elsewhere classified 12/21/2018 No Yes I87.2 Venous insufficiency (chronic) (peripheral) 12/07/2018 No Yes I70.235 Atherosclerosis of native arteries of right leg with 02/01/2019 No Yes ulceration of other part of foot Inactive Problems Resolved Problems Electronic Signature(s) Signed: 02/22/2019 5:54:03 PM By: Linton Ham MD Entered By: Linton Ham on 02/22/2019 11:39:43 -------------------------------------------------------------------------------- Progress Note Details Patient Name: Date of  Service: Tami Slade D. 02/22/2019 9:00 AM Medical Record [id number] Patient Account Number: [id number] Date of Birth/Sex: Treating RN: 1931-01-26 (85 y.o. [name] Primary Care Provider: Salena Saner Other Clinician: Referring Provider: Treating Provider/Extender:Asencion Loveday, Rhona Leavens, Doristine Locks in Treatment: 11 Subjective History of Present Illness (HPI) Pleasant 85 year old African-American female with history of bilateral DVTs with presumed history of hypercoagulable state, with venous insufficiency as a result, presenting with 3-week onset of right medial leg wounds just above the right ankle of 3 weeks duration. Patient was given a course of doxycycline for 7 days that she completed, has been putting mupirocin cream on the wounds, leaving it exposed at night and covering with dry gauze during the day. Overall patient who is present with her daughter in the clinic today states that the 2 wounds have been improving to the present size. There is also some seepage in and around the wound area. Both legs are swollen. No changes to dimensions of legs overall in the recent past. Patient was remotely seen here in the clinic in 2014 for right lower leg tibial wounds very similar to the ones were seeing today. 8/10-Patient returns after being initiated in the wound clinic last week. The right medial leg wounds about the right ankle are both slightly smaller in size, not much seepage, legs are not as swollen we are using a 3 layer compression of the right juxta lite on the left she denies any significant pain 8/17; the patient has 2 small open areas on the medial right leg. She has good edema control. There is no doubt she has underlying lymphedema. Still a lot of adherent debris to both wounds 8/24; same 2 small open areas on the medial right leg. Edema control. We have been using Iodoflex since last week and the surface of the wound looks some better. 9/14; 2  small openings on the right medial lower leg. The patient went for her venous reflux studies in [place] imaging. 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 The findings were similar to when examined 2016. There was normal appearance of the great saphenous veins and short saphenous veins bilaterally no evidence for superficial venous reflux and no varicosities. No indication for venous ablation We have been using Iodoflex. Changed to Sorbact today 9/21; 2 small openings in close juxtaposition on the right medial lower leg. She had difficulties this week with her 3 layer compression becoming too tight  she took this off late last Wednesday/Thursday morning. She did not put anything else on her leg quite a bit of nonpitting/lymphedema today still using Sorbact 9/28; 2 small wounds in close juxtaposition in the right medial lower leg. We have been using Sorbact. Changed to endoform today 10/5; 2 small wounds in close juxtaposition the right medial lower leg I changed to endoform last week unfortunately not much improvement 10/12; 2 small wounds in close juxtaposition on the right lower leg in the setting of severe chronic venous insufficiency secondary to prior DVTs. 10/19; 2 small wounds in close juxtaposition in the right lower leg in the setting of severe chronic venous insufficiency secondary to prior DVTs. The more anterior one is closed however the more posterior one still has not changed that much. Considerable depth. We have been using endoform Objective Constitutional Sitting or standing Blood Pressure is within target range for patient.. Pulse regular and within target range for patient.[name]. Respirations regular, non-labored and within target range.. Temperature is normal and within the target range for the patient.[name]. Appears in no distress. Vitals Time Taken: 9:28 AM, Height: 62 in, Weight: 158 lbs, BMI: 28.9, Temperature: 98.0  F, Pulse: 45 bpm, Respiratory Rate: 17 breaths/min, Blood Pressure: 127/65 mmHg. General Notes: Wound exam; 2 small wounds on the right medial lower leg superior 1 I think is fully closed. The inferior 1 unfortunately about the same. Still requiring debridement with a #3 curette to remove adherent necrotic debris. No evidence of surrounding infection. I am able to get to a fairly decent-looking wound surface but the granulation is certainly not filling in Integumentary (Hair, Skin) Wound #5 status is Open. Original cause of wound was Gradually Appeared. The wound is located on the Right,Medial Lower Leg. The wound measures 0.7cm length x 0.5cm width x 0.3cm depth; 0.275cm^2 area and 0.082cm^3 volume. There is Fat Layer (Subcutaneous Tissue) Exposed exposed. There is no tunneling or undermining noted. There is a small amount of serosanguineous drainage noted. The wound margin is distinct with the outline attached to the wound base. There is large (67-100%) pink, pale granulation within the wound bed. There is a small (1-33%) amount of necrotic tissue within the wound bed including Eschar. 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 There was a Excisional Skin/Subcutaneous Tissue Debridement with a total area of 0.35 sq cm performed by Maxwell Caulobson, Sabriyah Wilcher G., MD. With the following instrument(s): Curette to remove Viable and Non-Viable tissue/material. Material removed includes Subcutaneous Tissue and Slough and after achieving pain control using Lidocaine 4% Topical Solution. No specimens were taken. A time out was conducted at 10:34, prior to the start of the  procedure. A Minimum amount of bleeding was controlled with Pressure. The procedure was tolerated well with a pain level of 4 throughout and a pain level of 2 following the procedure. Post Debridement Measurements: 0.7cm length x 0.5cm width x 0.3cm depth; 0.082cm^3 volume. Character of Wound/Ulcer Post Debridement is improved. Post procedure Diagnosis Wound #5: Same as Pre-Procedure Pre-procedure diagnosis of Wound #5 is a Vasculopathy located on the Right,Medial Lower Leg . There was a Three Layer Compression Therapy Procedure by Zandra AbtsLynch, Shatara, RN. Post procedure Diagnosis Wound #5: Same as Pre-Procedure Plan Follow-up Appointments: Return Appointment in  1 week. Dressing Change Frequency: Wound #5 Right,Medial Lower Leg: Do not change entire dressing for one week. Skin Barriers/Peri-Wound Care: Wound #5 Right,Medial Lower Leg: Moisturizing lotion Wound Cleansing: Wound #5 Right,Medial Lower Leg: May shower with protection. Primary Wound Dressing: Wound #5 Right,Medial Lower Leg: Endoform - moisten with saline Secondary Dressing: Wound #5 Right,Medial Lower Leg: Dry Gauze Edema Control: 3 Layer Compression System - Right Lower Extremity Avoid standing for long periods of time Elevate legs to the level of the heart or above for 30 minutes daily and/or when sitting, a frequency of: - throughout the day Exercise regularly Support Garment 20-30 mm/Hg pressure to: - Juxtalite to left leg 1. Continue with endoform as the primary dressing 2. Run Gra[employer] [address] through her insurance. 3. 3 layer compression Electronic Signature(s) Signed: 02/22/2019 5:54:03 PM By: Baltazar Najjar MD Entered By: Baltazar Najjar on 02/22/2019 11:43:14 -------------------------------------------------------------------------------- SuperBill Details Patient Name: Date of Service: [name], [name] 02/22/2019 Medical Record [id number] Patient Account Number: [id number] Date of Birth/Sex: Treating  RN: April 20, 1931 (85 y.o. Wynelle Link Primary Care Provider: Alva Garnet Other Clinician: Referring Provider: Treating Provider/Extender:Stacee Earp, Gaylene Weinfeld, Ivonne Andrew in Treatment: 11 Diagnosis Coding ICD-10 Codes Code Description [phone] Non-pressure chronic ulcer of right ankle with fat layer exposed I89.0 Lymphedema, not elsewhere classified I87.2 Venous insufficiency (chronic) (peripheral) I70.235 Atherosclerosis of native arteries of right leg with ulceration of other part of foot Facility Procedures CPT4 Code Description: [id number] 11042 - DEB SUBQ TISSUE 20 SQ CM/< ICD-10 Diagnosis Description L97.312 Non-pressure chronic ulcer of right ankle with fat layer expo Modifier: sed Quantity: 1 Physician Procedures CPT4 Code Description: [id number] 11042 - WC PHYS SUBQ TISS 20 SQ CM ICD-10 Diagnosis Description L97.312 Non-pressure chronic ulcer of right ankle with fat layer expo Modifier: sed Quantity: 1 Electronic Signature(s) Signed: 02/22/2019 5:54:03 PM By: Baltazar Najjar MD Entered By: Baltazar Najjar on 02/22/2019 11:43:44

## 2019-02-24 NOTE — Progress Notes (Signed)
LASHE, [name] ([id number]) Visit Report for 02/22/2019 Arrival Information Details Patient Name: Date of Service: [name], [name] 02/22/2019 9:00 AM Medical Record [id number] Patient Account Number: [id number] Date of Birth/Sex: Treating RN: 03-May-1931 (85 y.o. [name] Primary Care Ren Aspinall: Salena Saner Other Clinician: Referring Gianni Mihalik: Treating Jaquayla Hege/Extender:Robson, Rhona Leavens, Doristine Locks in Treatment: 11 Visit Information History Since Last Visit Added or deleted any medications: No Patient Arrived: [name] Any new allergies or adverse reactions: No Arrival Time: 09:27 DAUGHTER Had a fall or experienced change in No Accompanied By: activities of daily living that may affect Transfer Assistance: None risk of falls: Patient Requires Transmission-Based No Signs or symptoms of abuse/neglect since last No Precautions: visito Patient Has Alerts: No Hospitalized since last visit: No Implantable device outside of the clinic excluding No cellular tissue based products placed in the center since last visit: Has Dressing in Place as Prescribed: Yes Has Compression in Place as Prescribed: Yes Pain Present Now: No Electronic Signature(s) Signed: 02/23/2019 6:16:57 PM By: Kela Millin Entered By: Kela Millin on 02/22/2019 09:28:00 -------------------------------------------------------------------------------- Compression Therapy Details Patient Name: Date of Service: Tami Slade D. 02/22/2019 9:00 AM Medical Record [id number] Patient Account Number: [id number] Date of Birth/Sex: Treating RN: [birth year] (85 y.o. [name] Primary Care Kamariya Blevens: Salena Saner Other Clinician: Referring Jonn Chaikin: Treating Elexus Barman/Extender:Robson, Rhona Leavens, Doristine Locks in Treatment: 11 Compression Therapy Performed for Wound Wound #5 Right,Medial Lower Leg Assessment: Performed By: Clinician Levan Hurst,  RN Compression Type: Three Layer Post Procedure Diagnosis Same as Pre-procedure Electronic Signature(s) Signed: 02/24/2019 6:50:57 PM By: Levan Hurst RN, BSN Entered By: Levan Hurst on 02/22/2019 10:37:43 -------------------------------------------------------------------------------- Encounter Discharge Information Details Patient Name: Date of Service: Tami Slade D. 02/22/2019 9:00 AM Medical Record [id number] Patient Account Number: [id number] Date of Birth/Sex: Treating RN: 1930/06/20 (85 y.o. [name] Primary Care Sotirios Navarro: Salena Saner Other Clinician: Referring Yon Schiffman: Treating Ayaan Ringle/Extender:Robson, Rhona Leavens, Doristine Locks in Treatment: 11 Encounter Discharge Information Items Post Procedure Vitals Discharge Condition: Stable Temperature (F): 98.0 Ambulatory Status: Cane Pulse (bpm): 45 Discharge Destination: Home Respiratory Rate (breaths/min): 17 Transportation: Private Auto Blood Pressure (mmHg): 127/65 Accompanied By: daughter Schedule Follow-up Appointment: Yes Clinical Summary of Care: Patient Declined Electronic Signature(s) Signed: 02/23/2019 6:16:57 PM By: Kela Millin Entered By: Kela Millin on 02/22/2019 11:01:21 -------------------------------------------------------------------------------- Lower Extremity Assessment Details Patient Name: Date of Service: MONAI, [name] 02/22/2019 9:00 AM Medical Record [id number] Patient Account Number: [id number] Date of Birth/Sex: Treating RN: [birth year] (85 y.o. [name] Primary Care Aquanetta Schwarz: Salena Saner Other Clinician: Referring Glendy Barsanti: Treating Lane Eland/Extender:Robson, Rhona Leavens, Doristine Locks in Treatment: 11 Edema Assessment Assessed: [Left: No] [Right: No] Edema: [Left: Ye] [Right: s] Calf Left: Right: Point of Measurement: 35 cm From Medial Instep cm 35.5 cm Ankle Left: Right: Point of Measurement: 10 cm  From Medial Instep cm 23.4 cm Vascular Assessment Pulses: Dorsalis Pedis Palpable: [Right:Yes] Electronic Signature(s) Signed: 02/23/2019 6:16:57 PM By: Kela Millin Entered By: Kela Millin on 02/22/2019 09:33:37 -------------------------------------------------------------------------------- Multi Wound Chart Details Patient Name: Date of Service: Tami Slade D. 02/22/2019 9:00 AM Medical Record [id number] Patient Account Number: [id number] Date of Birth/Sex: Treating RN: [birth year] (85 y.o. [name] Primary Care Jeily Guthridge: Salena Saner Other Clinician: Referring Nakhi Choi: Treating Jennings Corado/Extender:Robson, Rhona Leavens, Doristine Locks in Treatment: 11 Vital Signs Height(in): 62 Pulse(bpm): 45 Weight(lbs): 158 Blood Pressure(mmHg): 127/65 Body Mass Index(BMI): 29 Temperature(F): 98.0 Respiratory 17 Rate(breaths/min): Photos: [5:No Photos] [N/A:N/A] Wound Location: [5:Right Lower  Leg - Medial] [N/A:N/A] Wounding Event: [5:Gradually Appeared] [N/A:N/A] Primary Etiology: [5:Vasculopathy] [N/A:N/A] Comorbid History: [5:Deep Vein Thrombosis, Hypertension] [N/A:N/A] Date Acquired: [5:11/04/2018] [N/A:N/A] Weeks of Treatment: [5:11] [N/A:N/A] Wound Status: [5:Open] [N/A:N/A] Measurements L x W x D 0.7x0.5x0.3 [N/A:N/A] (cm) Area (cm) : [5:0.275] [N/A:N/A] Volume (cm) : [5:0.082] [N/A:N/A] % Reduction in Area: [5:93.10%] [N/A:N/A] % Reduction in Volume: 89.80% [N/A:N/A] Classification: [5:Full Thickness Without Exposed Support Structures] [N/A:N/A] Exudate Amount: [5:Small] [N/A:N/A N/A] Exudate Type: [5:Serosanguineous] [N/A:N/A N/A] Exudate Color: [5:red, brown] [N/A:N/A N/A] Wound Margin: [5:Distinct, outline attached N/A] [N/A:N/A] Granulation Amount: [5:Large (67-100%)] [N/A:N/A N/A] Granulation Quality: [5:Pink, Pale] [N/A:N/A N/A] Necrotic Amount: [5:Small (1-33%)] [N/A:N/A N/A] Necrotic Tissue: [5:Eschar] [N/A:N/A  N/A] Exposed Structures: [5:Fat Layer (Subcutaneous N/A Tissue) Exposed: Yes Fascia: No Tendon: No Muscle: No Joint: No Bone: No] [N/A:N/A] Epithelialization: [5:Medium (34-66%)] [N/A:N/A N/A] Debridement: [5:Debridement - Excisional N/A] [N/A:N/A] Pre-procedure [5:10:34] [N/A:N/A N/A] Verification/Time Out Taken: Pain Control: [5:Lidocaine 4% Topical Solution] [N/A:N/A N/A] Tissue Debrided: [5:Subcutaneous, Slough] [N/A:N/A N/A] Level: [5:Skin/Subcutaneous Tissue] [N/A:N/A N/A] Debridement Area (sq cm):0.35 [N/A:N/A N/A] Instrument: [5:Curette] [N/A:N/A N/A] Bleeding: [5:Minimum] [N/A:N/A N/A] Hemostasis Achieved: [5:Pressure] [N/A:N/A N/A] Procedural Pain: [5:4] [N/A:N/A N/A] Post Procedural Pain: [5:2] [N/A:N/A N/A] Debridement Treatment Procedure was tolerated [N/A:N/A N/A] Response: [5:well] Post Debridement [5:0.7x0.5x0.3] [N/A:N/A N/A] Measurements L x W x D (cm) Post Debridement [5:0.082] [N/A:N/A N/A] Volume: (cm) Procedures Performed: Compression Therapy [5:Debridement] [N/A:N/A N/A] Treatment Notes Wound #5 (Right, Medial Lower Leg) 1. Cleanse With Wound Cleanser Soap and water 2. Periwound Care Moisturizing lotion 3. Primary Dressing Applied Endoform 4. Secondary Dressing Dry Gauze 6. Support Layer Applied 3 layer compression wrap Notes stretch net Electronic Signature(s) Signed: 02/22/2019 5:54:03 PM By: Linton Ham MD Signed: 02/24/2019 6:50:57 PM By: Levan Hurst RN, BSN Entered By: Linton Ham on 02/22/2019 11:39:55 -------------------------------------------------------------------------------- Multi-Disciplinary Care Plan Details Patient Name: Date of Service: Tami Slade D. 02/22/2019 9:00 AM Medical Record [id number] Patient Account Number: [id number] Date of Birth/Sex: Treating RN: 02/01/1931 (85 y.o. [name] Primary Care Tesean Stump: Salena Saner Other Clinician: Referring Jaely Silman: Treating  Meshulem Onorato/Extender:Robson, Rhona Leavens, Doristine Locks in Treatment: 11 Active Inactive Wound/Skin Impairment Nursing Diagnoses: Impaired tissue integrity Knowledge deficit related to ulceration/compromised skin integrity Goals: Patient/caregiver will verbalize understanding of skin care regimen Date Initiated: 12/07/2018 Target Resolution Date: 03/05/2019 Goal Status: Active Ulcer/skin breakdown will have a volume reduction of 30% by week 4 Date Initiated: 12/07/2018 Date Inactivated: 01/18/2019 Target Resolution Date: 01/08/2019 Goal Status: Met Interventions: Assess patient/caregiver ability to obtain necessary supplies Assess patient/caregiver ability to perform ulcer/skin care regimen upon admission and as needed Assess ulceration(s) every visit Provide education on ulcer and skin care Notes: Electronic Signature(s) Signed: 02/24/2019 6:50:57 PM By: Levan Hurst RN, BSN Entered By: Levan Hurst on 02/22/2019 13:58:58 -------------------------------------------------------------------------------- Pain Assessment Details Patient Name: Date of Service: Tami Slade D. 02/22/2019 9:00 AM Medical Record [id number] Patient Account Number: [id number] Date of Birth/Sex: Treating RN: Jun 14, 1930 (85 y.o. [name] Primary Care Myeshia Fojtik: Salena Saner Other Clinician: Referring Camillia Marcy: Treating Nathanyl Andujo/Extender:Robson, Rhona Leavens, Doristine Locks in Treatment: 11 Active Problems Location of Pain Severity and Description of Pain Patient Has Paino No Site Locations Pain Management and Medication Current Pain Management: Electronic Signature(s) Signed: 02/23/2019 6:16:57 PM By: Kela Millin Entered By: Kela Millin on 02/22/2019 09:29:14 -------------------------------------------------------------------------------- Patient/Caregiver Education Details Patient Name: Date of Service: [name] 10/19/2020andnbsp9:00 AM Medical  Record [id number] Patient Account Number: [id number] Date of Birth/Gender: Treating RN: January 21, 1931 (85 y.o. Benjamine Sprague, Briant Cedar  Primary Care Physician: Salena Saner Other Clinician: Referring Physician: Treating Physician/Extender:Robson, Rhona Leavens, Doristine Locks in Treatment: 11 Education Assessment Education Provided To: Patient Education Topics Provided Wound/Skin Impairment: Methods: Explain/Verbal Responses: State content correctly [employer]) Signed: 02/24/2019 6:50:57 PM By: Levan Hurst RN, BSN Entered By: Levan Hurst on 02/22/2019 13:59:08 -------------------------------------------------------------------------------- Wound Assessment Details Patient Name: Date of Service: Tami Slade D. 02/22/2019 9:00 AM Medical Record [id number] Patient Account Number: [id number] Date of Birth/Sex: Treating RN: 1931-02-15 (85 y.o. [name] Primary Care Mandeep Ferch: Salena Saner Other Clinician: Referring Tanee Henery: Treating Jennessa Trigo/Extender:Robson, Rhona Leavens, Doristine Locks in Treatment: 11 Wound Status Wound Number: 5 Primary Etiology: Vasculopathy Wound Location: Right Lower Leg - Medial Wound Status: Open Wounding Event: Gradually Appeared Comorbid History: Deep Vein Thrombosis, Hypertension Date Acquired: 11/04/2018 Weeks Of Treatment: 11 Clustered Wound: No Photos Wound Measurements Length: (cm) 0.7 % Reduc Width: (cm) 0.5 % Reduc Depth: (cm) 0.3 Epithel Area: (cm) 0.275 Tunnel Volume: (cm) 0.082 Underm Wound Description Classification: Full Thickness Without Exposed Support Foul O Structures Slough Wound Distinct, outline attached Margin: Exudate Small Amount: Exudate Serosanguineous Type: Exudate red, brown Color: Wound Bed Granulation Amount: Large (67-100%) Granulation Quality: Pink, Pale Fascia Exp Necrotic Amount: Small (1-33%) Fat Layer Necrotic Quality: Eschar Tendon Exp Muscle  Exp Joint Expo Bone Expos dor After Cleansing: No /Fibrino Yes Exposed Structure osed: No (Subcutaneous Tissue) Exposed: Yes osed: No osed: No sed: No ed: No tion in Area: 93.1% tion in Volume: 89.8% ialization: Medium (34-66%) ing: No ining: No Treatment Notes Wound #5 (Right, Medial Lower Leg) 1. Cleanse With Wound Cleanser Soap and water 2. Periwound Care Moisturizing lotion 3. Primary Dressing Applied Endoform 4. Secondary Dressing Dry Gauze 6. Support Layer Applied 3 layer compression wrap Notes stretch net Electronic Signature(s) Signed: 02/23/2019 1:32:19 PM By: Mikeal Hawthorne EMT/HBOT Signed: 02/23/2019 6:16:57 PM By: Kela Millin Entered By: Mikeal Hawthorne on 02/23/2019 11:49:25 -------------------------------------------------------------------------------- Vitals Details Patient Name: Date of Service: Tami Slade D. 02/22/2019 9:00 AM Medical Record [id number] Patient Account Number: [id number] Date of Birth/Sex: Treating RN: May 15, 1930 (85 y.o. [name] Primary Care Mattison Stuckey: Salena Saner Other Clinician: Referring Iqra Rotundo: Treating Shermar Friedland/Extender:Robson, Rhona Leavens, Doristine Locks in Treatment: 11 Vital Signs Time Taken: 09:28 Temperature (F): 98.0 Height (in): 62 Pulse (bpm): 45 Weight (lbs): 158 Respiratory Rate (breaths/min): 17 Body Mass Index (BMI): 28.9 Blood Pressure (mmHg): 127/65 Reference Range: 80 - 120 mg / dl Electronic Signature(s) Signed: 02/23/2019 6:16:57 PM By: Kela Millin Entered By: Kela Millin on 02/22/2019 09:29:05

## 2019-03-01 ENCOUNTER — Other Ambulatory Visit: Payer: Self-pay

## 2019-03-01 ENCOUNTER — Encounter (HOSPITAL_BASED_OUTPATIENT_CLINIC_OR_DEPARTMENT_OTHER): Payer: Medicare Other | Admitting: Internal Medicine

## 2019-03-01 DIAGNOSIS — L97812 Non-pressure chronic ulcer of other part of right lower leg with fat layer exposed: Secondary | ICD-10-CM | POA: Diagnosis not present

## 2019-03-01 NOTE — Progress Notes (Signed)
[name], [name] ([id number]) Visit Report for 03/01/2019 Debridement Details Patient Name: Date of Service: [name], [name] 03/01/2019 9:00 AM Medical Record [id number] Patient Account Number: [id number] Date of Birth/Sex: Treating RN: 10/28/1930 (85 y.o. [name] Primary Care Provider: Alva Garnet Other Clinician: Referring Provider: Treating Provider/Extender:Robson, Gaylene Kai, Ivonne Andrew in Treatment: 12 Debridement Performed for Wound #5 Right,Medial Lower Leg Assessment: Performed By: Physician Maxwell Caul., MD Debridement Type: Debridement Level of Consciousness (Pre- Awake and Alert procedure): Pre-procedure Yes - 10:37 Verification/Time Out Taken: Start Time: 10:37 Pain Control: Lidocaine 4% Topical Solution Total Area Debrided (L x W): 0.9 (cm) x 0.8 (cm) = 0.72 (cm) Tissue and other material Viable, Non-Viable, Slough, Subcutaneous, Slough debrided: Level: Skin/Subcutaneous Tissue Debridement Description: Excisional Instrument: Curette Bleeding: Minimum Hemostasis Achieved: Pressure End Time: 10:38 Procedural Pain: 0 Post Procedural Pain: 0 Response to Treatment: Procedure was tolerated well Level of Consciousness Awake and Alert (Post-procedure): Post Debridement Measurements of Total Wound Length: (cm) 0.9 Width: (cm) 0.8 Depth: (cm) 0.2 Volume: (cm) 0.113 Character of Wound/Ulcer Post Improved Debridement: Post Procedure Diagnosis Same as Pre-procedure Electronic Signature(s) Signed: 03/01/2019 5:48:14 PM By: Baltazar Najjar MD Signed: 03/01/2019 6:04:10 PM By: Tami Abts RN, BSN Entered By: Baltazar Najjar on 03/01/2019 11:43:41 -------------------------------------------------------------------------------- HPI Details Patient Name: Date of Service: Tami Comber D. 03/01/2019 9:00 AM Medical Record [id number] Patient Account Number: [id number] Date of Birth/Sex: Treating RN: 04-Jan-1931 (85 y.o.  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 Patient was given a course of doxycycline for 7 days that she completed, has been putting mupirocin cream on the wounds, leaving it exposed at night and covering with dry gauze during the day. Overall patient who is present with her daughter in the clinic today states that the 2 wounds have been improving to the present size. There is also some seepage in and around the wound area. Both legs are swollen. No changes to dimensions of legs overall in the recent past. Patient was remotely seen here in the clinic in 2014 for right lower leg tibial wounds very similar to the ones were seeing today. 8/10-Patient returns after being initiated in the wound clinic last week. The right medial leg wounds about the right ankle are both slightly smaller in size, not much seepage, legs are not as swollen we are using a 3 layer compression of the right juxta lite on the left she denies any significant pain 8/17; the patient has 2 small open areas on the medial right leg. She has good edema control. There is no doubt she has underlying lymphedema. Still a lot of adherent debris to both wounds 8/24; same 2 small open areas on the medial right leg. Edema control. We have been using Iodoflex since last week and the surface of the wound looks some better. 9/14; 2 small openings on the right medial lower leg. The patient went for her venous reflux studies in [facility]. 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 The findings were similar to when examined 2016. There was normal appearance of the great saphenous veins and short  saphenous veins bilaterally no evidence for superficial venous reflux and no varicosities. No indication for venous ablation We have been using Iodoflex. Changed to Sorbact today 9/21; 2 small openings in close juxtaposition on the right medial lower leg. She had difficulties this week with her 3 layer compression becoming too tight she took this off late last Wednesday/Thursday morning. She did not put anything else on her leg quite a bit of nonpitting/lymphedema today still using Sorbact 9/28; 2 small wounds in close juxtaposition in the right medial lower leg. We have been using Sorbact. Changed to endoform today 10/5; 2 small wounds in close juxtaposition the right medial lower leg I changed to endoform last week unfortunately not much improvement 10/12; 2 small wounds in close juxtaposition on the right lower leg in the setting of severe chronic venous insufficiency secondary to prior DVTs. 10/19; 2 small wounds in close juxtaposition in the right lower leg in the setting of severe chronic venous insufficiency secondary to prior DVTs. The more anterior one is closed however the more posterior one still has not changed that much. Considerable depth. We have been using endoform 10/26; not much change in the 1 remaining wound. Still deep and punched out looking. Adherent surface slough. We have been using endoform. We have been trying to run Grafix PL but have not heard a response yet Electronic Signature(s) Signed: 03/01/2019 5:48:14 PM By: Baltazar Najjarobson, Michael MD Entered By: Baltazar Najjarobson, Michael on 03/01/2019 11:44:14 -------------------------------------------------------------------------------- Physical Exam Details Patient Name: Date of Service: [name], Tami D. 03/01/2019 9:00 AM Medical Record [id number] Patient  Account Number: [id number] Date of Birth/Sex: Treating RN: 05/10/1930 (85 y.o. [name]) [name] Primary Care Provider: Alva GarnetShelton, Kimberly R Other Clinician: Referring Provider: Treating Provider/Extender:Robson, Gaylene BrooksMichael Shelton, Ivonne AndrewKimberly R Weeks in Treatment: 12 Constitutional Sitting or standing Blood Pressure is within target range for patient.. Pulse regular and within target range for patient.[name]. Respirations regular, non-labored and within target range.. Temperature is normal and within the target range for the patient.[name]. Appears in no distress. Notes Wound exam; small wound on the right medial lower leg. It has however punched out depth. Using a #3 curette adherent yellow debris on the surface removed. This cleans up quite nicely but is disappointing. The satellite wound has closed over completely. There is no evidence of infection around either wound area Electronic Signature(s) Signed: 03/01/2019 5:48:14 PM By: Baltazar Najjarobson, Michael MD Entered By: Baltazar Najjarobson, Michael on 03/01/2019 11:45:07 -------------------------------------------------------------------------------- Physician Orders Details Patient Name: Date of Service: [name], Tami D. 03/01/2019 9:00 AM Medical Record [id number] Patient Account Number: [id number] Date of Birth/Sex: Treating RN: 10/12/1930 (85 y.o. [name]) [name] Primary Care Provider: Alva GarnetShelton, Kimberly R Other Clinician: Referring Provider: Treating Provider/Extender:Robson, Gaylene BrooksMichael Shelton, Ivonne AndrewKimberly R Weeks in Treatment: 12 Verbal / Phone Orders: No Diagnosis Coding ICD-10 Coding Code Description L97.312 Non-pressure chronic ulcer of right ankle with fat layer exposed I89.0 Lymphedema, not elsewhere classified I87.2 Venous insufficiency (chronic) (peripheral) I70.235 Atherosclerosis of native arteries of right leg with ulceration of other part of foot Follow-up Appointments Return Appointment in 1 week. Dressing Change Frequency Wound #5 Right,Medial  Lower Leg Do not change entire dressing for one week. Skin Barriers/Peri-Wound Care Wound #5 Right,Medial Lower Leg Moisturizing lotion Wound Cleansing Wound #5 Right,Medial Lower Leg May shower with protection. Primary Wound Dressing Wound #5 Right,Medial Lower Leg Endoform - moisten with saline Secondary Dressing Wound #5 Right,Medial Lower Leg Dry Gauze Edema Control 3 Layer Compression System - Right Lower Extremity Avoid standing for long periods of time Elevate  legs to the level of the heart or above for 30 minutes daily and/or when sitting, a frequency of: - throughout the day Exercise regularly Support Garment 20-30 mm/Hg pressure to: - Juxtalite to left leg Electronic Signature(s) Signed: 03/01/2019 5:48:14 PM By: Linton Ham MD Signed: 03/01/2019 6:04:10 PM By: Levan Hurst RN, BSN Entered By: Levan Hurst on 03/01/2019 10:37:27 -------------------------------------------------------------------------------- Problem List Details Patient Name: Date of Service: Tami Slade D. 03/01/2019 9:00 AM Medical Record [id number] Patient Account Number: [id number] Date of Birth/Sex: Treating RN: 1930-06-22 (85 y.o. [name] Primary Care Provider: Salena Saner Other Clinician: Referring Provider: Treating Provider/Extender:Robson, Rhona Leavens, Doristine Locks in Treatment: 12 Active Problems ICD-10 Evaluated Encounter Code Description Active Date Today Diagnosis L97.312 Non-pressure chronic ulcer of right ankle with fat layer 12/07/2018 No Yes exposed I89.0 Lymphedema, not elsewhere classified 12/21/2018 No Yes I87.2 Venous insufficiency (chronic) (peripheral) 12/07/2018 No Yes I70.235 Atherosclerosis of native arteries of right leg with 02/01/2019 No Yes ulceration of other part of foot Inactive Problems Resolved Problems Electronic Signature(s) Signed: 03/01/2019 5:48:14 PM By: Linton Ham MD Entered By: Linton Ham on 03/01/2019  11:43:26 -------------------------------------------------------------------------------- Progress Note Details Patient Name: Date of Service: Tami Slade D. 03/01/2019 9:00 AM Medical Record [id number] Patient Account Number: [id number] Date of Birth/Sex: Treating RN: January 22, 1931 (85 y.o. [name] Primary Care Provider: Salena Saner Other Clinician: Referring Provider: Treating Provider/Extender:Robson, Rhona Leavens, Doristine Locks in Treatment: 12 Subjective History of Present Illness (HPI) Pleasant 85 year old African-American female with history of bilateral DVTs with presumed history of hypercoagulable state, with venous insufficiency as a result, presenting with 3-week onset of right medial leg wounds just above the right ankle of 3 weeks duration. Patient was given a course of doxycycline for 7 days that she completed, has been putting mupirocin cream on the wounds, leaving it exposed at night and covering with dry gauze during the day. Overall patient who is present with her daughter in the clinic today states that the 2 wounds have been improving to the present size. There is also some seepage in and around the wound area. Both legs are swollen. No changes to dimensions of legs overall in the recent past. Patient was remotely seen here in the clinic in 2014 for right lower leg tibial wounds very similar to the ones were seeing today. 8/10-Patient returns after being initiated in the wound clinic last week. The right medial leg wounds about the right ankle are both slightly smaller in size, not much seepage, legs are not as swollen we are using a 3 layer compression of the right juxta lite on the left she denies any significant pain 8/17; the patient has 2 small open areas on the medial right leg. She has good edema control. There is no doubt she has underlying lymphedema. Still a lot of adherent debris to both wounds 8/24; same 2 small open areas on the  medial right leg. Edema control. We have been using Iodoflex since last week and the surface of the wound looks some better. 9/14; 2 small openings on the right medial lower leg. The patient went for her venous reflux studies in [facility]. 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 The findings were similar to when examined 2016. There was normal appearance of the great saphenous veins and short saphenous veins bilaterally no evidence for superficial venous reflux and no varicosities. No indication for venous ablation We have been using Iodoflex. Changed  to Sorbact today 9/21; 2 small openings in close juxtaposition on the right medial lower leg. She had difficulties this week with her 3 layer compression becoming too tight she took this off late last Wednesday/Thursday morning. She did not put anything else on her leg quite a bit of nonpitting/lymphedema today still using Sorbact 9/28; 2 small wounds in close juxtaposition in the right medial lower leg. We have been using Sorbact. Changed to endoform today 10/5; 2 small wounds in close juxtaposition the right medial lower leg I changed to endoform last week unfortunately not much improvement 10/12; 2 small wounds in close juxtaposition on the right lower leg in the setting of severe chronic venous insufficiency secondary to prior DVTs. 10/19; 2 small wounds in close juxtaposition in the right lower leg in the setting of severe chronic venous insufficiency secondary to prior DVTs. The more anterior one is closed however the more posterior one still has not changed that much. Considerable depth. We have been using endoform 10/26; not much change in the 1 remaining wound. Still deep and punched out looking. Adherent surface slough. We have been using endoform. We have been trying to run Grafix PL but have not heard a response yet Objective Constitutional Sitting or standing  Blood Pressure is within target range for patient.. Pulse regular and within target range for patient.[name] Respirations regular, non-labored and within target range.. Temperature is normal and within the target range for the patient.[name] Appears in no distress. Vitals Time Taken: 9:50 AM, Height: 62 in, Weight: 158 lbs, BMI: 28.9, Temperature: 97.8 F, Pulse: 62 bpm, Respiratory Rate: 18 breaths/min, Blood Pressure: 125/88 mmHg. General Notes: Wound exam; small wound on the right medial lower leg. It has however punched out depth. Using a #3 curette adherent yellow debris on the surface removed. This cleans up quite nicely but is disappointing. The satellite wound has closed over completely. There is no evidence of infection around either wound area Integumentary (Hair, Skin) Wound #5 status is Open. Original cause of wound was Gradually Appeared. The wound is located on the Right,Medial Lower Leg. The wound measures 0.9cm length x 0.8cm width x 0.2cm depth; 0.565cm^2 area and 0.113cm^3 volume. There is Fat Layer (Subcutaneous Tissue) Exposed exposed. There is no tunneling or undermining noted. There is a small amount of serosanguineous drainage noted. The wound margin is distinct with the outline attached to the wound base. There is large (67-100%) pink, pale granulation within the wound bed. There is a small (1-33%) amount of necrotic tissue within the wound bed including Eschar. 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 There was a Excisional Skin/Subcutaneous Tissue Debridement with a total area of 0.72 sq cm performed by Maxwell Caul., MD. With the  following instrument(s): Curette to remove Viable and Non-Viable tissue/material. Material removed includes Subcutaneous Tissue and Slough and after achieving pain control using Lidocaine 4% Topical Solution. No specimens were taken. A time out was conducted at 10:37, prior to the start of the procedure. A Minimum amount of bleeding was controlled with Pressure. The procedure was tolerated well with a pain level of 0 throughout and a pain level of 0 following the procedure. Post Debridement Measurements: 0.9cm length x 0.8cm width x 0.2cm depth;  0.113cm^3 volume. Character of Wound/Ulcer Post Debridement is improved. Post procedure Diagnosis Wound #5: Same as Pre-Procedure Pre-procedure diagnosis of Wound #5 is a Vasculopathy located on the Right,Medial Lower Leg . There was a Three Layer Compression Therapy Procedure by [name], Shatara, RN. Post procedure Diagnosis Wound #5: Same as Pre-Procedure Plan Follow-up Appointments: Return Appointment in 1 week. Dressing Change Frequency: Wound #5 Right,Medial Lower Leg: Do not change entire dressing for one week. Skin Barriers/Peri-Wound Care: Wound #5 Right,Medial Lower Leg: Moisturizing lotion Wound Cleansing: Wound #5 Right,Medial Lower Leg: May shower with protection. Primary Wound Dressing: Wound #5 Right,Medial Lower Leg: Endoform - moisten with saline Secondary Dressing: Wound #5 Right,Medial Lower Leg: Dry Gauze Edema Control: 3 Layer Compression System - Right Lower Extremity Avoid standing for long periods of time Elevate legs to the level of the heart or above for 30 minutes daily and/or when sitting, a frequency of: - throughout the day Exercise regularly Support Garment 20-30 mm/Hg pressure to: - Juxtalite to left leg 1. Continue with endoform for 1 more week. If the surface is what it was the day may need to change back to Iodoflex which I think is what cleaned this up the best. 2. Run Grafix PL although she is not ready  for that yet Electronic Signature(s) Signed: 03/01/2019 5:48:14 PM By: Baltazar Najjarobson, Michael MD Entered By: Baltazar Najjarobson, Michael on 03/01/2019 11:45:53 -------------------------------------------------------------------------------- SuperBill Details Patient Name: Date of Service: [name], Tami D. 03/01/2019 Medical Record [id number] Patient Account Number: [id number] Date of Birth/Sex: Treating RN: 05/17/1930 (85 y.o. [name]) [name] Primary Care Provider: Alva GarnetShelton, Kimberly R Other Clinician: Referring Provider: Treating Provider/Extender:Robson, Gaylene BrooksMichael Shelton, Ivonne AndrewKimberly R Weeks in Treatment: 12 Diagnosis Coding ICD-10 Codes Code Description [phone] Non-pressure chronic ulcer of right ankle with fat layer exposed I89.0 Lymphedema, not elsewhere classified I87.2 Venous insufficiency (chronic) (peripheral) I70.235 Atherosclerosis of native arteries of right leg with ulceration of other part of foot Facility Procedures CPT4 Code Description: [id number] 11042 - DEB SUBQ TISSUE 20 SQ CM/< ICD-10 Diagnosis Description L97.312 Non-pressure chronic ulcer of right ankle with fat layer expo Modifier: sed Quantity: 1 Physician Procedures CPT4 Code Description: [id number] 11042 - WC PHYS SUBQ TISS 20 SQ CM ICD-10 Diagnosis Description L97.312 Non-pressure chronic ulcer of right ankle with fat layer expo Modifier: sed Quantity: 1 Electronic Signature(s) Signed: 03/01/2019 5:48:14 PM By: Baltazar Najjarobson, Michael MD Entered By: Baltazar Najjarobson, Michael on 03/01/2019 11:46:12

## 2019-03-08 ENCOUNTER — Other Ambulatory Visit: Payer: Self-pay

## 2019-03-08 ENCOUNTER — Encounter (HOSPITAL_BASED_OUTPATIENT_CLINIC_OR_DEPARTMENT_OTHER): Payer: Medicare Other | Attending: Internal Medicine | Admitting: Internal Medicine

## 2019-03-08 DIAGNOSIS — I70235 Atherosclerosis of native arteries of right leg with ulceration of other part of foot: Secondary | ICD-10-CM | POA: Diagnosis not present

## 2019-03-08 DIAGNOSIS — I890 Lymphedema, not elsewhere classified: Secondary | ICD-10-CM | POA: Insufficient documentation

## 2019-03-08 DIAGNOSIS — Z86718 Personal history of other venous thrombosis and embolism: Secondary | ICD-10-CM | POA: Insufficient documentation

## 2019-03-08 DIAGNOSIS — L97312 Non-pressure chronic ulcer of right ankle with fat layer exposed: Secondary | ICD-10-CM | POA: Diagnosis not present

## 2019-03-08 DIAGNOSIS — I872 Venous insufficiency (chronic) (peripheral): Secondary | ICD-10-CM | POA: Insufficient documentation

## 2019-03-08 DIAGNOSIS — Z6828 Body mass index (BMI) 28.0-28.9, adult: Secondary | ICD-10-CM | POA: Insufficient documentation

## 2019-03-09 ENCOUNTER — Emergency Department (HOSPITAL_COMMUNITY)
Admission: EM | Admit: 2019-03-09 | Discharge: 2019-03-10 | Disposition: A | Discharge status: Home / Self Care | Payer: Medicare Other | Attending: Emergency Medicine | Admitting: Emergency Medicine

## 2019-03-09 ENCOUNTER — Other Ambulatory Visit: Payer: Self-pay

## 2019-03-09 ENCOUNTER — Encounter (HOSPITAL_COMMUNITY): Payer: Self-pay

## 2019-03-09 ENCOUNTER — Emergency Department (HOSPITAL_BASED_OUTPATIENT_CLINIC_OR_DEPARTMENT_OTHER)
Admit: 2019-03-09 | Discharge: 2019-03-09 | Disposition: A | Discharge status: Home / Self Care | Payer: Medicare Other | Attending: Emergency Medicine | Admitting: Emergency Medicine

## 2019-03-09 DIAGNOSIS — R600 Localized edema: Secondary | ICD-10-CM

## 2019-03-09 DIAGNOSIS — M7989 Other specified soft tissue disorders: Secondary | ICD-10-CM

## 2019-03-09 DIAGNOSIS — I10 Essential (primary) hypertension: Secondary | ICD-10-CM | POA: Diagnosis not present

## 2019-03-09 DIAGNOSIS — Z86718 Personal history of other venous thrombosis and embolism: Secondary | ICD-10-CM | POA: Diagnosis not present

## 2019-03-09 DIAGNOSIS — I440 Atrioventricular block, first degree: Secondary | ICD-10-CM | POA: Insufficient documentation

## 2019-03-09 DIAGNOSIS — R609 Edema, unspecified: Secondary | ICD-10-CM | POA: Insufficient documentation

## 2019-03-09 DIAGNOSIS — R001 Bradycardia, unspecified: Secondary | ICD-10-CM | POA: Diagnosis not present

## 2019-03-09 DIAGNOSIS — M79604 Pain in right leg: Secondary | ICD-10-CM | POA: Diagnosis present

## 2019-03-09 NOTE — Discharge Instructions (Signed)
Please discontinue your Amlodipine for now and discuss with your doctor possible alternatives in the setting of markedly prolonged PR interval.

## 2019-03-09 NOTE — Progress Notes (Signed)
Right lower extremity venous duplex has been completed. Preliminary results can be found in CV Proc through chart review.  Results were given to [employer] PA.  03/09/19 6:29 PM Carlos Levering RVT

## 2019-03-09 NOTE — ED Triage Notes (Signed)
Pt presents to ED via POV for acute onset RLE swelling. PCP sent her here to r/o blood clot. Pt reports having a blood clot in the 60's

## 2019-03-09 NOTE — ED Provider Notes (Signed)
Emergency Department Provider Note   I have reviewed the triage vital signs and the nursing notes.   HISTORY  Chief Complaint Leg Pain   HPI [name] is a 85 y.o. female with h/o DVT and venous insufficiency who presents to the ED with worsening RLE edema. No associated pain. Started last night, improved today. PCP asked to come to ED to r/o DVT. Also she has noted that she has bradycardia that seems to be new from her wound care clinics. She has not had any unusual weakness, near syncope, lightheadedness, fatigue or other associated symptoms.  No one has looked into this in the past.  No new medication changes.  No other associated or modifying symptoms.    Past Medical History:  Diagnosis Date   Blood clot in vein    GERD (gastroesophageal reflux disease)    H/O echocardiogram 09/05/2009   EF >55%   High cholesterol    Hypertension    Sleep apnea     Patient Active Problem List   Diagnosis Date Noted   DVT (deep venous thrombosis) (HCC) 11/01/2015   Acute DVT (deep venous thrombosis) (HCC) 11/01/2015    Past Surgical History:  Procedure Laterality Date   ABDOMINAL HYSTERECTOMY     CARDIAC CATHETERIZATION  01/27/2003   NM MYOVIEW LTD  8/804   RULE OUT ISCHEMIA   TUBAL LIGATION      Current Outpatient Rx   Order #: [id number] Class: Print   Order #: [id number] Class: Historical Med   Order #: [id number] Class: Historical Med   Order #: [id number] Class: Historical Med   Order #: [id number] Class: Historical Med   Order #: [id number] Class: Historical Med    Allergies Patient has no known allergies.  Family History  Family history unknown: Yes    Social History Social History   Tobacco Use   Smoking status: Never Smoker   Smokeless tobacco: Never Used  Substance Use Topics   Alcohol use: No   Drug use: No    Review of Systems  All other systems negative except as documented in the HPI. All pertinent positives and negatives as  reviewed in the HPI. ____________________________________________   PHYSICAL EXAM:  VITAL SIGNS: ED Triage Vitals  Enc Vitals Group     BP 03/09/19 1418 (!) 144/51     Pulse Rate 03/09/19 1418 (!) 46     Resp 03/09/19 1418 15     Temp 03/09/19 1418 98.6 F (37 C)     Temp src --      SpO2 03/09/19 1418 97 %     Weight 03/09/19 1418 151 lb (68.5 kg)     Height 03/09/19 1418 5\' 4"  (1.626 m)    Constitutional: Alert and oriented. Well appearing and in no acute distress. Eyes: Conjunctivae are normal. PERRL. EOMI. Head: Atraumatic. Nose: No congestion/rhinnorhea. Mouth/Throat: Mucous membranes are moist.  Oropharynx non-erythematous. Neck: No stridor.  No meningeal signs.  Mild JVD, but not significant.  Cardiovascular: Bradycardic rate, regular rhythm. Good peripheral circulation. Grossly normal heart sounds.  No obvious S3. Respiratory: Normal respiratory effort.  No retractions. Lungs CTAB. Gastrointestinal: Soft and nontender. No distention.  Musculoskeletal: No lower extremity tenderness. BLE edema to mid shin. No gross deformities of extremities. Neurologic:  Normal speech and language. No gross focal neurologic deficits are appreciated.  Skin:  Skin is warm, dry and intact. No rash noted. Well healing wound to right medial ankle.  ____________________________________________   EKG   EKG Interpretation  Date/Time:  Tuesday March 09 2019 23:40:56 EST Ventricular Rate:  50 PR Interval:    QRS Duration: 86 QT Interval:  450 QTC Calculation: 410 R Axis:   108 Text Interpretation: Sinus bradycardia with 1st degree A-V block Rightward axis Low voltage QRS Borderline ECG PR interval more prolonged than previously Confirmed by Marily MemosMesner, Shlonda Dolloff [phone]([id number]) on 03/10/2019 12:05:01 AM       ____________________________________________  RADIOLOGY  Timmothy EulerVas Koreas Lower Extremity Venous (dvt) (only Mc & Wl 7a-7p)  Result Date: 03/09/2019  Lower Venous Study Indications: Swelling.  Risk  Factors: None identified. Limitations: Poor ultrasound/tissue interface. Comparison Study: No prior studies. Performing Technologist: Chanda BusingGregory Collins RVT  Examination Guidelines: A complete evaluation includes B-mode imaging, spectral Doppler, color Doppler, and power Doppler as needed of all accessible portions of each vessel. Bilateral testing is considered an integral part of a complete examination. Limited examinations for reoccurring indications may be performed as noted.  +---------+---------------+---------+-----------+----------+--------------+  RIGHT     Compressibility Phasicity Spontaneity Properties Thrombus Aging  +---------+---------------+---------+-----------+----------+--------------+  CFV       Full            Yes       Yes                                    +---------+---------------+---------+-----------+----------+--------------+  SFJ       Full                                                             +---------+---------------+---------+-----------+----------+--------------+  FV Prox   Full                                                             +---------+---------------+---------+-----------+----------+--------------+  FV Mid    Full                                                             +---------+---------------+---------+-----------+----------+--------------+  FV Distal Full                                                             +---------+---------------+---------+-----------+----------+--------------+  PFV       Full                                                             +---------+---------------+---------+-----------+----------+--------------+  POP       Full            Yes  Yes                                    +---------+---------------+---------+-----------+----------+--------------+  PTV       Full                                                             +---------+---------------+---------+-----------+----------+--------------+  PERO      Full                                                              +---------+---------------+---------+-----------+----------+--------------+   +----+---------------+---------+-----------+----------+--------------+  LEFT Compressibility Phasicity Spontaneity Properties Thrombus Aging  +----+---------------+---------+-----------+----------+--------------+  CFV  Full            Yes       Yes                                    +----+---------------+---------+-----------+----------+--------------+     Summary: Right: There is no evidence of deep vein thrombosis in the lower extremity. No cystic structure found in the popliteal fossa. Left: No evidence of common femoral vein obstruction.  *See table(s) above for measurements and observations.    Preliminary     ____________________________________________   INITIAL IMPRESSION / ASSESSMENT AND PLAN / ED COURSE  Leg swelling: There is no evidence of DVT on ultrasound.  At this time her leg is not edematous above the knee which was the new part.  She has her baseline edema in bilateral lower extremities.  Her feet are warm with no evidence of acute arterial occlusion.  There is no evidence of cellulitis in her leg either.  Has mild JVD but no crackles, dyspnea, S3 or worsening persistent edema to suggest new onset heart failure.  Unclear what the cause of this might have been however she can continue work-up with her PCP as it does not seem to be emergent at this time.  Patient was noted to be bradycardic.  This does seem to be asymptomatic however an EKG was performed to evaluate the rhythm.  Is appears to be a sinus bradycardia with a significant prolonged PR interval.  She is on amlodipine so I discussed with her and her daughter and we will plan to discontinue that until follow-up with PCP.  Discussed symptoms that would necessitate return with both her and her daughter.  Pertinent labs & imaging results that were available during my care of the patient were reviewed by  me and considered in my medical decision making (see chart for details).  A medical screening exam was performed and I feel the patient has had an appropriate workup for their chief complaint at this time and likelihood of emergent condition existing is low. They have been counseled on decision, discharge, follow up and which symptoms necessitate immediate return to the emergency department. They or their family verbally stated understanding and agreement with plan and discharged in stable condition.   ____________________________________________  FINAL CLINICAL IMPRESSION(S) /  ED DIAGNOSES  Final diagnoses:  Leg edema  Sinus bradycardia  Prolonged P-R interval     MEDICATIONS GIVEN DURING THIS VISIT:  Medications - No data to display   NEW OUTPATIENT MEDICATIONS STARTED DURING THIS VISIT:  New Prescriptions   No medications on file    Note:  This note was prepared with assistance of Dragon voice recognition software. Occasional wrong-word or sound-a-like substitutions may have occurred due to the inherent limitations of voice recognition software.   Delray Reza, Corene Cornea, MD 03/10/19 [phone]

## 2019-03-10 ENCOUNTER — Encounter (HOSPITAL_BASED_OUTPATIENT_CLINIC_OR_DEPARTMENT_OTHER): Payer: Medicare Other | Admitting: Physician Assistant

## 2019-03-10 DIAGNOSIS — I890 Lymphedema, not elsewhere classified: Secondary | ICD-10-CM | POA: Diagnosis not present

## 2019-03-10 NOTE — ED Notes (Signed)
Patient verbalizes understanding of discharge instructions. Opportunity for questioning and answers were provided. Armband removed by staff, pt discharged from ED ambulatory.   

## 2019-03-10 NOTE — Progress Notes (Signed)
[name], [name] ([id number]) Visit Report for 03/10/2019 SuperBill Details Patient Name: Date of Service: [name], [name] 03/10/2019 Medical Record [id number] Patient Account Number: [id number] Date of Birth/Sex: Treating RN: 1930/07/04 (85 y.o. [name] Primary Care Provider: Salena Saner Other Clinician: Referring Provider: Treating Provider/Extender:Stone III, Bobbye Charleston, Doristine Locks in Treatment: 13 Diagnosis Coding ICD-10 Codes Code Description [phone] Non-pressure chronic ulcer of right ankle with fat layer exposed I89.0 Lymphedema, not elsewhere classified I87.2 Venous insufficiency (chronic) (peripheral) I70.235 Atherosclerosis of native arteries of right leg with ulceration of other part of foot Facility Procedures CPT4 Code Description Modifier Quantity [id number] (Facility Use Only) [phone] - APPLY MULTLAY COMPRS LWR RT LEG 1 Electronic Signature(s) Signed: 03/10/2019 6:30:51 PM By: Deon Pilling Signed: 03/10/2019 6:48:11 PM By: Worthy Keeler PA-C Entered By: Deon Pilling on 03/10/2019 17:16:58

## 2019-03-10 NOTE — Progress Notes (Signed)
Tami, Landry ([id number]) Visit Report for 03/10/2019 Arrival Information Details Patient Name: Date of Service: [name], [name] 03/10/2019 3:00 PM Medical Record [id number] Patient Account Number: [id number] Date of Birth/Sex: Treating RN: 09-Jul-1930 (85 y.o. Debara Pickett, Millard.Loa Primary Care Tyrell Brereton: Alva Garnet Other Clinician: Referring [name]: Treating Wilmetta Speiser/Extender:Stone III, Cristina Gong, Ivonne Andrew in Treatment: 13 Visit Information History Since Last Visit Added or deleted any medications: Yes Patient Arrived: Cane Any new allergies or adverse reactions: No Arrival Time: 16:09 daughter Had a fall or experienced change in No Accompanied By: activities of daily living that may affect Transfer Assistance: None risk of falls: Patient Identification Verified: Yes Signs or symptoms of abuse/neglect since last No Secondary Verification Process Completed: Yes visito Patient Requires Transmission-Based No Hospitalized since last visit: No Precautions: Implantable device outside of the clinic excluding No Patient Has Alerts: No cellular tissue based products placed in the center since last visit: Has Dressing in Place as Prescribed: No Has Compression in Place as Prescribed: No Pain Present Now: No Notes Went to [place] ED r/t swelling to right leg. Per daughter patient to see PCP to discontinue amlopidine and to change bp med to something else. Electronic Signature(s) Signed: 03/10/2019 6:30:51 PM By: Shawn Stall Entered By: Shawn Stall on 03/10/2019 16:11:33 -------------------------------------------------------------------------------- Compression Therapy Details Patient Name: Date of Service: [name], Landry 03/10/2019 3:00 PM Medical Record [id number] Patient Account Number: [id number] Date of Birth/Sex: Treating RN: 12-Aug-1930 (85 y.o. Arta Silence Primary Care Auri Jahnke: Alva Garnet Other Clinician: Referring  Kobie Matkins: Treating Novah Goza/Extender:Stone III, Cristina Gong, Ivonne Andrew in Treatment: 13 Compression Therapy Performed for Wound Wound #5 Right,Medial Lower Leg Assessment: Performed By: Clinician Shawn Stall, RN Compression Type: Three Layer Pre Treatment ABI: 1.6 Electronic Signature(s) Signed: 03/10/2019 6:30:51 PM By: Shawn Stall Entered By: Shawn Stall on 03/10/2019 16:27:53 -------------------------------------------------------------------------------- Encounter Discharge Information Details Patient Name: Date of Service: Tami Comber D. 03/10/2019 3:00 PM Medical Record [id number] Patient Account Number: [id number] Date of Birth/Sex: Treating RN: Apr 27, 1931 (85 y.o. Arta Silence Primary Care Angelyne Terwilliger: Alva Garnet Other Clinician: Referring Malani Lees: Treating Isabellamarie Randa/Extender:Stone III, Cristina Gong, Ivonne Andrew in Treatment: 13 Encounter Discharge Information Items Discharge Condition: Stable Ambulatory Status: Cane Discharge Destination: Home Transportation: Private Auto Accompanied By: daughter Schedule Follow-up Appointment: Yes Clinical Summary of Care: Electronic Signature(s) Signed: 03/10/2019 6:30:51 PM By: Shawn Stall Entered By: Shawn Stall on 03/10/2019 17:16:48 -------------------------------------------------------------------------------- Patient/Caregiver Education Details Patient Name: Date of Service: [name] 11/4/2020andnbsp3:00 PM Medical Record [id number] Patient Account Number: [id number] Date of Birth/Gender: Treating RN: 03/01/1931 (85 y.o. Arta Silence Primary Care Physician: Alva Garnet Other Clinician: Referring Physician: Treating Physician/Extender:Stone III, Cristina Gong, Ivonne Andrew in Treatment: 13 Education Assessment Education Provided To: Patient Education Topics Provided Wound/Skin Impairment: Handouts: Skin Care Do's and Dont's Methods:  Explain/Verbal Responses: Reinforcements needed Electronic Signature(s) Signed: 03/10/2019 6:30:51 PM By: Shawn Stall Entered By: Shawn Stall on 03/10/2019 17:16:34 -------------------------------------------------------------------------------- Wound Assessment Details Patient Name: Date of Service: [name], Landry 03/10/2019 3:00 PM Medical Record [id number] Patient Account Number: [id number] Date of Birth/Sex: Treating RN: Feb 23, 1931 (85 y.o. Arta Silence Primary Care Gaila Engebretsen: Alva Garnet Other Clinician: Referring Mirtie Bastyr: Treating Darrold Bezek/Extender:Stone III, Cristina Gong, Ivonne Andrew in Treatment: 13 Wound Status Wound Number: 5 Primary Etiology: Vasculopathy Wound Location: Right Lower Leg - Medial Wound Status: Open Wounding Event: Gradually Appeared Comorbid History: Deep Vein Thrombosis, Hypertension Date Acquired: 11/04/2018 Weeks Of Treatment: 13 Clustered Wound:  No Wound Measurements Length: (cm) 0.8 % Reduct Width: (cm) 0.6 % Reduct Depth: (cm) 0.2 Epitheli Area: (cm) 0.377 Tunneli Volume: (cm) 0.075 Undermi Wound Description Classification: Full Thickness Without Exposed Support Foul Od Structures Slough/ Wound Distinct, outline attached Margin: Exudate Small Amount: Exudate Serosanguineous Type: Exudate red, brown Color: Wound Bed Granulation Amount: Large (67-100%) Granulation Quality: Pink, Pale Fascia Necrotic Amount: Small (1-33%) Fat Lay Necrotic Quality: Adherent Slough Tendon Expo Muscle Expo Joint Expos Bone Expose or After Cleansing: No Fibrino Yes Exposed Structure Exposed: No er (Subcutaneous Tissue) Exposed: Yes sed: No sed: No ed: No d: No ion in Area: 90.6% ion in Volume: 90.6% alization: Medium (34-66%) ng: No ning: No Treatment Notes Wound #5 (Right, Medial Lower Leg) 1. Cleanse With Wound Cleanser 2. Periwound Care Moisturizing lotion 3. Primary Dressing Applied Endoform 4.  Secondary Dressing Dry Gauze 6. Support Layer Applied 3 layer compression wrap Notes stretch net Electronic Signature(s) Signed: 03/10/2019 6:30:51 PM By: Deon Pilling Entered By: Deon Pilling on 03/10/2019 16:27:03 -------------------------------------------------------------------------------- Vitals Details Patient Name: Date of Service: [name] 03/10/2019 3:00 PM Medical Record [id number] Patient Account Number: [id number] Date of Birth/Sex: Treating RN: 06/05/1930 (85 y.o. [name], Meta.Reding Primary Care Jeran Hiltz: Salena Saner Other Clinician: Referring Bay Jarquin: Treating Jillisa Harris/Extender:Stone III, Bobbye Charleston, Doristine Locks in Treatment: 13 Vital Signs Time Taken: 16:11 Temperature (F): 97.8 Height (in): 62 Pulse (bpm): 42 Weight (lbs): 158 Respiratory Rate (breaths/min): 16 Body Mass Index (BMI): 28.9 Blood Pressure (mmHg): 128/53 Reference Range: 80 - 120 mg / dl Electronic Signature(s) Signed: 03/10/2019 6:30:51 PM By: Deon Pilling Entered By: Deon Pilling on 03/10/2019 16:13:43

## 2019-03-11 NOTE — Progress Notes (Signed)
[name], Tami D. ([id number]) Visit Report for 03/08/2019 Debridement Details Patient Name: Date of Service: [name], Tami D. 03/08/2019 9:00 AM Medical Record [id number] Patient Account Number: [id number] Date of Birth/Sex: Treating RN: 08/13/1930 (85 y.o. [name]) [name], Shatara Primary Care Provider: Alva GarnetShelton, Kimberly R Other Clinician: Referring Provider: Treating Provider/Extender:, Gaylene  Shelton, Ivonne AndrewKimberly R Weeks in Treatment: 13 Debridement Performed for Wound #5 Right,Medial Lower Leg Assessment: Performed By: Physician Maxwell Caulobson,  G., MD Debridement Type: Debridement Level of Consciousness (Pre- Awake and Alert procedure): Pre-procedure Yes - 10:25 Verification/Time Out Taken: Start Time: 10:25 Pain Control: Other : BENZOCAINE 20% spray Total Area Debrided (L x W): 0.8 (cm) x 0.6 (cm) = 0.48 (cm) Tissue and other material Slough, Subcutaneous, Slough debrided: Level: Skin/Subcutaneous Tissue Debridement Description: Excisional Instrument: Curette Bleeding: Minimum Hemostasis Achieved: Pressure End Time: 10:26 Procedural Pain: 0 Post Procedural Pain: 0 Response to Treatment: Procedure was tolerated well Level of Consciousness Awake and Alert (Post-procedure): Post Debridement Measurements of Total Wound Length: (cm) 0.8 Width: (cm) 0.6 Depth: (cm) 0.2 Volume: (cm) 0.075 Character of Wound/Ulcer Post Improved Debridement: Post Procedure Diagnosis Same as Pre-procedure Electronic Signature(s) Signed: 03/08/2019 5:57:33 PM By: Baltazar Najjarobson,  MD Signed: 03/08/2019 6:00:40 PM By: Zandra AbtsLynch, Shatara RN, BSN Entered By: Baltazar Najjarobson,  on 03/08/2019 10:32:54 -------------------------------------------------------------------------------- HPI Details Patient Name: Date of Service: [name], Tami D. 03/08/2019 9:00 AM Medical Record [id number] Patient Account Number: [id number] Date of Birth/Sex: Treating RN: 02/25/1931 (85 y.o. 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 Patient was given a course of doxycycline for 7 days that she completed, has been putting mupirocin cream on the wounds, leaving it exposed at night and covering with dry gauze during the day. Overall patient who is present with her daughter in the clinic today states that the 2 wounds have been improving to the present size. There is also some seepage in and around the wound area. Both legs are swollen. No changes to dimensions of legs overall in the recent past. Patient was remotely seen here in the clinic in 2014 for right lower leg tibial wounds very similar to the ones were seeing today. 8/10-Patient returns after being initiated in the wound clinic last week. The right medial leg wounds about the right ankle are both slightly smaller in size, not much seepage, legs are not as swollen we are using a 3 layer compression of the right juxta lite on the left she denies any significant pain 8/17; the patient has 2 small open areas on the medial right leg. She has good edema control. There is no doubt she has underlying lymphedema. Still a lot of adherent debris to both wounds 8/24; same 2 small open areas on the medial right leg. Edema control. We have been using Iodoflex since last week and the surface of the wound looks some better. 9/14; 2 small openings on the right medial lower leg. The patient went for her venous reflux studies in [place] [facility]. 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 The findings were similar to when examined 2016. There was normal appearance of the great saphenous veins and short saphenous  veins bilaterally no evidence for superficial venous reflux and no varicosities. No indication for venous ablation We have been using Iodoflex. Changed to Sorbact today 9/21; 2 small openings in close juxtaposition on the right medial lower leg. She had difficulties this week with her 3 layer compression becoming too tight she took this off late last Wednesday/Thursday morning. She did not put anything else on her leg quite a bit of nonpitting/lymphedema today still using Sorbact 9/28; 2 small wounds in close juxtaposition in the right medial lower leg. We have been using Sorbact. Changed to endoform today 10/5; 2 small wounds in close juxtaposition the right medial lower leg I changed to endoform last week unfortunately not much improvement 10/12; 2 small wounds in close juxtaposition on the right lower leg in the setting of severe chronic venous insufficiency secondary to prior DVTs. 10/19; 2 small wounds in close juxtaposition in the right lower leg in the setting of severe chronic venous insufficiency secondary to prior DVTs. The more anterior one is closed however the more posterior one still has not changed that much. Considerable depth. We have been using endoform 10/26; not much change in the 1 remaining wound. Still deep and punched out looking. Adherent surface slough. We have been using endoform. We have been trying to run Grafix PL but have not heard a response yet 11/2; wound is measuring slightly smaller. Depth looks like it is come in better. Better looking surface although still requiring a light debridement. Grafix PL not covered by [place] [facility], we will run [employer] Electronic Signature(s) Signed: 03/08/2019 5:57:33 PM By: Baltazar Najjarobson,  MD Entered By: Baltazar Najjarobson,  on 03/08/2019  10:33:40 -------------------------------------------------------------------------------- Physical Exam Details Patient Name: Date of Service: [name], Tami D. 03/08/2019 9:00 AM Medical Record [id number] Patient Account Number: [id number] Date of Birth/Sex: Treating RN: 06/25/1930 (85 y.o. [name]) [name], Shatara Primary Care Provider: Alva GarnetShelton, Kimberly R Other Clinician: Referring Provider: Treating Provider/Extender:, Gaylene  Shelton, Ivonne AndrewKimberly R Weeks in Treatment: 13 Constitutional Sitting or standing Blood Pressure is within target range for patient.. Pulse regular and within target range for patient.[name]. Respirations regular, non-labored and within target range.. Temperature is normal and within the target range for the patient.[name]. Appears in no distress. Respiratory work of breathing is normal. Cardiovascular Fetal pulses palpable on the right. Notes Wound exam; right medial lower leg. Small punched out area. Less depth. Using a #3 curette still removing adherent surface debris however this cleans up much better than previously with a healthy granulated surface. Hemostasis with direct pressure no evidence of surrounding infection Electronic Signature(s) Signed: 03/08/2019 5:57:33 PM By: Baltazar Najjarobson,  MD Entered By: Baltazar Najjarobson,  on 03/08/2019 10:36:47 -------------------------------------------------------------------------------- Physician Orders Details Patient Name: Date of Service: [name], Tami D. 03/08/2019 9:00 AM Medical Record [id number] Patient Account Number: [id number] Date of Birth/Sex: Treating RN: 11/01/1930 (85 y.o. Harvest DarkF) Dwiggins, Shannon Primary Care Provider: Alva GarnetShelton, Kimberly R Other Clinician: Referring Provider: Treating Provider/Extender:, Gaylene  Shelton, Ivonne AndrewKimberly R Weeks in Treatment: 13 Verbal / Phone Orders: No Diagnosis Coding Follow-up Appointments Return Appointment in 1 week. Dressing Change Frequency Wound #5 Right,Medial  Lower Leg Do not change entire dressing for one week. Skin Barriers/Peri-Wound Care Wound #5 Right,Medial Lower Leg Moisturizing lotion Wound Cleansing Wound #5 Right,Medial Lower Leg May shower with protection. Primary Wound Dressing Wound #5 Right,Medial Lower Leg Endoform - moisten with saline Secondary Dressing Wound #5 Right,Medial Lower Leg Dry Gauze Edema Control 3 Layer Compression System - Right Lower Extremity Avoid standing for long periods of time Elevate legs to the  level of the heart or above for 30 minutes daily and/or when sitting, a frequency of: - throughout the day Exercise regularly Support Garment 20-30 mm/Hg pressure to: - Juxtalite to left leg Electronic Signature(s) Signed: 03/08/2019 5:57:33 PM By: Baltazar Najjar MD Signed: 03/11/2019 5:16:49 PM By: Cherylin Mylar Entered By: Cherylin Mylar on 03/08/2019 09:41:42 -------------------------------------------------------------------------------- Problem List Details Patient Name: Date of Service: Tami Comber D. 03/08/2019 9:00 AM Medical Record [id number] Patient Account Number: [id number] Date of Birth/Sex: Treating RN: 05-Apr-1931 (85 y.o. 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 03/08/2019 9:00 AM Medical Record [id number] Patient Account Number: [id number] Date of Birth/Sex: Treating RN: March 24, 1931 (85 y.o. Wynelle Link Primary Care Provider: Alva Garnet Other Clinician: Referring Provider: Treating Provider/Extender:, Gaylene Campas, Ivonne Andrew in Treatment: 13 Subjective History of Present Illness (HPI) Pleasant 85 year old African-American female with history of bilateral DVTs with presumed history of hypercoagulable state, with venous insufficiency as a result, presenting with 3-week onset of right medial leg wounds just above the right ankle of 3 weeks duration. Patient was given a course of doxycycline for 7 days that she completed, has been putting mupirocin cream on the wounds, leaving it exposed at night and covering with dry gauze during the day. Overall patient who is present with her daughter in the clinic today states that the 2 wounds have been improving to the present size. There is also some seepage in and around the wound area. Both legs are swollen. No changes to dimensions of legs overall in the recent past. Patient was remotely seen here in the clinic in 2014 for right lower leg tibial wounds very similar to the ones were seeing today. 8/10-Patient returns after being initiated in the wound clinic last week. The right medial leg wounds about the right ankle are both slightly smaller in size, not much seepage, legs are not as swollen we are using a 3 layer compression of the right juxta lite on the left she denies any significant pain 8/17; the patient has 2 small open areas on the medial right leg. She has good edema control. There is no doubt she has underlying lymphedema. Still a lot of adherent debris to both wounds 8/24; same 2 small open areas on the  medial right leg. Edema control. We have been using Iodoflex since last week and the surface of the wound looks some better. 9/14; 2 small openings on the right medial lower leg. The patient went for her venous reflux studies in [place] [facility]. 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 The findings were similar to when examined 2016. There was normal appearance of the great saphenous veins and short saphenous veins bilaterally no evidence for superficial venous reflux and no varicosities. No indication for venous ablation We have been using Iodoflex. Changed to Sorbact today 9/21; 2  small openings in close juxtaposition on the right medial lower leg. She had difficulties this week with her 3 layer compression becoming too tight she took this off late last Wednesday/Thursday morning. She did not put anything else on her leg quite a bit of nonpitting/lymphedema today still using Sorbact 9/28; 2 small wounds in close juxtaposition in the right medial lower leg. We have been using Sorbact. Changed to endoform today 10/5; 2 small wounds in close juxtaposition the right medial lower leg I changed to endoform last week unfortunately not much improvement 10/12; 2 small wounds in close juxtaposition on the right lower leg in the setting of severe chronic venous insufficiency secondary to prior DVTs. 10/19; 2 small wounds in close juxtaposition in the right lower leg in the setting of severe chronic venous insufficiency secondary to prior DVTs. The more anterior one is closed however the more posterior one still has not changed that much. Considerable depth. We have been using endoform 10/26; not much change in the 1 remaining wound. Still deep and punched out looking. Adherent surface slough. We have been using endoform. We have been trying to run Grafix PL but have not heard a response yet 11/2; wound is measuring slightly smaller. Depth  looks like it is come in better. Better looking surface although still requiring a light debridement. Grafix PL not covered by [place] [facility], we will run [employer] Constitutional Sitting or standing Blood Pressure is within target range for patient.. Pulse regular and within target range for patient.[name] Respirations regular, non-labored and within target range.. Temperature is normal and within the target range for the patient.[name] Appears in no distress. Vitals Time Taken: 10:07 AM, Height: 62 in, Weight: 158 lbs, BMI: 28.9, Temperature: 97.6 F, Pulse: 42 bpm, Respiratory Rate: 16 breaths/min, Blood Pressure: 135/54 mmHg. General Notes: rechecked heart rate manual 42. Patient denies, pain, dizziness, blurry vision, or lightheaded. Respiratory work of breathing is normal. Cardiovascular Fetal pulses palpable on the right. General Notes: Wound exam; right medial lower leg. Small punched out area. Less depth. Using a #3 curette still removing adherent surface debris however this cleans up much better than previously with a healthy granulated surface. Hemostasis with direct pressure no evidence of surrounding infection Integumentary (Hair, Skin) Wound #5 status is Open. Original cause of wound was Gradually Appeared. The wound is located on the Right,Medial Lower Leg. The wound measures 0.8cm length x 0.6cm width x 0.2cm depth; 0.377cm^2 area and 0.075cm^3 volume. There is Fat Layer (Subcutaneous Tissue) Exposed exposed. There is no tunneling or undermining noted. There is a small amount of serosanguineous drainage noted. The wound margin is distinct with the outline attached to the wound base. There is large (67-100%) pink, pale granulation within the wound bed. There is a small (1-33%) amount of necrotic tissue within the wound bed including Adherent Slough. Assessment Active Problems ICD-10 Non-pressure chronic ulcer of right ankle with fat layer exposed Lymphedema, not elsewhere  classified Venous insufficiency (chronic) (peripheral) Atherosclerosis of native arteries of right leg with ulceration of other part of foot Procedures Wound #5 Pre-procedure diagnosis of Wound #5 is a Vasculopathy located on the Right,Medial Lower Leg . There was a Excisional Skin/Subcutaneous Tissue Debridement with a total area of 0.48 sq cm performed by Maxwell Caul., MD. With the following instrument(s): Curette Material removed includes Subcutaneous Tissue and Slough and after achieving pain control using Other (BENZOCAINE 20% spray). No specimens were taken. A time out was conducted at 10:25, prior to the start of  the procedure. A Minimum amount of bleeding was controlled with Pressure. The procedure was tolerated well with a pain level of 0 throughout and a pain level of 0 following the procedure. Post Debridement Measurements: 0.8cm length x 0.6cm width x 0.2cm depth; 0.075cm^3 volume. Character of Wound/Ulcer Post Debridement is improved. Post procedure Diagnosis Wound #5: Same as Pre-Procedure Pre-procedure diagnosis of Wound #5 is a Vasculopathy located on the Right,Medial Lower Leg . There was a Three Layer Compression Therapy Procedure by Kela Millin, RN. Post procedure Diagnosis Wound #5: Same as Pre-Procedure Plan Follow-up Appointments: Return Appointment in 1 week. Dressing Change Frequency: Wound #5 Right,Medial Lower Leg: Do not change entire dressing for one week. Skin Barriers/Peri-Wound Care: Wound #5 Right,Medial Lower Leg: Moisturizing lotion Wound Cleansing: Wound #5 Right,Medial Lower Leg: May shower with protection. Primary Wound Dressing: Wound #5 Right,Medial Lower Leg: Endoform - moisten with saline Secondary Dressing: Wound #5 Right,Medial Lower Leg: Dry Gauze Edema Control: 3 Layer Compression System - Right Lower Extremity Avoid standing for long periods of time Elevate legs to the level of the heart or above for 30 minutes daily  and/or when sitting, a frequency of: - throughout the day Exercise regularly Support Garment 20-30 mm/Hg pressure to: - Juxtalite to left leg 1. Endoform moistened with saline 2 Run [employer] through [place] [facility] although their denial may be an "class effect" 3 I think we are making slow but steady improvement Electronic Signature(s) Signed: 03/08/2019 5:57:33 PM By: Linton Ham MD Entered By: Linton Ham on 03/08/2019 10:37:55 -------------------------------------------------------------------------------- SuperBill Details Patient Name: Date of Service: [name] 03/08/2019 Medical Record [id number] Patient Account Number: [id number] Date of Birth/Sex: Treating RN: 11-Oct-1930 (85 y.o. [name] Primary Care Provider: Salena Saner Other Clinician: Referring Provider: Treating Provider/Extender:, Rhona Leavens, Doristine Locks in Treatment: 13 Diagnosis Coding ICD-10 Codes Code Description [phone] Non-pressure chronic ulcer of right ankle with fat layer exposed I89.0 Lymphedema, not elsewhere classified I87.2 Venous insufficiency (chronic) (peripheral) I70.235 Atherosclerosis of native arteries of right leg with ulceration of other part of foot Facility Procedures CPT4 Code Description: [id number] 11042 - DEB SUBQ TISSUE 20 SQ CM/< ICD-10 Diagnosis Description H82.993 Non-pressure chronic ulcer of right ankle with fat layer expo Modifier: sed Quantity: 1 Physician Procedures CPT4 Code Description: [id number] 11042 - WC PHYS SUBQ TISS 20 SQ CM ICD-10 Diagnosis Description L38.101 Non-pressure chronic ulcer of right ankle with fat layer expo Modifier: sed Quantity: 1 Electronic Signature(s) Signed: 03/08/2019 5:57:33 PM By: Linton Ham MD Entered By: Linton Ham on 03/08/2019 10:38:07

## 2019-03-11 NOTE — Progress Notes (Signed)
FAIZA, BANSAL ([id number]) Visit Report for 03/08/2019 Arrival Information Details Patient Name: Date of Service: DORTHIA, TOUT 03/08/2019 9:00 AM Medical Record [id number] Patient Account Number: [id number] Date of Birth/Sex: Treating RN: 1930-10-13 (85 y.o. [name], Meta.Reding Primary Care Lianne Carreto: Salena Saner Other Clinician: Referring Shany Marinez: Treating Maily Debarge/Extender:Robson, Rhona Leavens, Doristine Locks in Treatment: 13 Visit Information History Since Last Visit Cane Added or deleted any medications: No Patient Arrived: 09:56 Any new allergies or adverse reactions: No Arrival Time: Had a fall or experienced change in No Accompanied By: self None activities of daily living that may affect Transfer Assistance: risk of falls: Patient Identification Verified: Yes Signs or symptoms of abuse/neglect since last No Secondary Verification Process Completed: Yes visito Patient Requires Transmission-Based No Hospitalized since last visit: No Precautions: Implantable device outside of the clinic excluding No Patient Has Alerts: No cellular tissue based products placed in the center since last visit: Has Dressing in Place as Prescribed: Yes Has Compression in Place as Prescribed: Yes Pain Present Now: No Electronic Signature(s) Signed: 03/08/2019 5:49:34 PM By: Deon Pilling Entered By: Deon Pilling on 03/08/2019 09:57:27 -------------------------------------------------------------------------------- Compression Therapy Details Patient Name: Date of Service: [name] 03/08/2019 9:00 AM Medical Record [id number] Patient Account Number: [id number] Date of Birth/Sex: Treating RN: 10-26-1930 (85 y.o. [name] Primary Care Eros Montour: Salena Saner Other Clinician: Referring Adanya Sosinski: Treating Aubrielle Stroud/Extender:Robson, Rhona Leavens, Doristine Locks in Treatment: 13 Compression Therapy Performed for Wound Wound #5 Right,Medial  Lower Leg Assessment: Performed By: Clinician Kela Millin, RN Compression Type: Three Layer Post Procedure Diagnosis Same as Pre-procedure Electronic Signature(s) Signed: 03/11/2019 5:16:49 PM By: Kela Millin Entered By: Kela Millin on 03/08/2019 10:26:47 -------------------------------------------------------------------------------- Lower Extremity Assessment Details Patient Name: Date of Service: AMANPREET, [place] 03/08/2019 9:00 AM Medical Record [id number] Patient Account Number: [id number] Date of Birth/Sex: Treating RN: [birth year] (85 y.o. [name], [name] Primary Care Shannan Slinker: Salena Saner Other Clinician: Referring Malashia Kamaka: Treating Ernestine Rohman/Extender:Robson, Rhona Leavens, Doristine Locks in Treatment: 13 Edema Assessment Assessed: [Left: No] [Right: Yes] Edema: [Left: Ye] [Right: s] Calf Left: Right: Point of Measurement: 35 cm From Medial Instep cm 33 cm Ankle Left: Right: Point of Measurement: 10 cm From Medial Instep cm 22 cm Vascular Assessment Pulses: Dorsalis Pedis Palpable: [Right:Yes] Electronic Signature(s) Signed: 03/08/2019 5:49:34 PM By: Deon Pilling Entered By: Deon Pilling on 03/08/2019 10:04:22 -------------------------------------------------------------------------------- Multi Wound Chart Details Patient Name: Date of Service: [name] 03/08/2019 9:00 AM Medical Record [id number] Patient Account Number: [id number] Date of Birth/Sex: Treating RN: [birth year] (85 y.o. [name] Primary Care Gregorio Worley: Salena Saner Other Clinician: Referring Verbie Babic: Treating Alexsander Cavins/Extender:Robson, Rhona Leavens, Doristine Locks in Treatment: 13 Vital Signs Height(in): 62 Pulse(bpm): 42 Weight(lbs): 158 Blood Pressure(mmHg): 135/54 Body Mass Index(BMI): 29 Temperature(F): 97.6 Respiratory 16 Rate(breaths/min): Photos: [5:No Photos] [N/A:N/A] Wound Location: [5:Right Lower Leg - Medial  N/A] Wounding Event: [5:Gradually Appeared] [N/A:N/A] Primary Etiology: [5:Vasculopathy] [N/A:N/A] Comorbid History: [5:Deep Vein Thrombosis, Hypertension] [N/A:N/A] Date Acquired: [5:11/04/2018] [N/A:N/A] Weeks of Treatment: [5:13] [N/A:N/A] Wound Status: [5:Open] [N/A:N/A] Measurements L x W x D 0.8x0.6x0.2 [N/A:N/A] (cm) Area (cm) : [5:0.377] [N/A:N/A] Volume (cm) : [5:0.075] [N/A:N/A] % Reduction in Area: [5:90.60%] [N/A:N/A] % Reduction in Volume: 90.60% [N/A:N/A] Classification: [5:Full Thickness Without Exposed Support Structures] [N/A:N/A] Exudate Amount: [5:Small] [N/A:N/A] Exudate Type: [5:Serosanguineous] [N/A:N/A] Exudate Color: [5:red, brown] [N/A:N/A] Wound Margin: [5:Distinct, outline attached N/A] Granulation Amount: [5:Large (67-100%)] [N/A:N/A] Granulation Quality: [5:Pink, Pale] [N/A:N/A] Necrotic Amount: [5:Small (1-33%)] [N/A:N/A] Exposed Structures: [5:Fat  Layer (Subcutaneous N/A Tissue) Exposed: Yes Fascia: No Tendon: No Muscle: No Joint: No Bone: No] Epithelialization: [5:Medium (34-66%)] [N/A:N/A] Debridement: [5:Debridement - Excisional N/A] Pre-procedure [5:10:25] [N/A:N/A] Verification/Time Out Taken: Pain Control: [5:Other] [N/A:N/A] Tissue Debrided: [5:Subcutaneous, Slough] [N/A:N/A] Level: [5:Skin/Subcutaneous Tissue N/A] Debridement Area (sq cm):0.48 [N/A:N/A] Instrument: [5:Curette] [N/A:N/A] Bleeding: [5:Minimum] [N/A:N/A] Hemostasis Achieved: [5:Pressure] [N/A:N/A] Procedural Pain: [5:0] [N/A:N/A] Post Procedural Pain: [5:0] [N/A:N/A] Debridement Treatment Procedure was tolerated [N/A:N/A] Response: [5:well] Post Debridement [5:0.8x0.6x0.2] [N/A:N/A] Measurements L x W x D (cm) Post Debridement [5:0.075] [N/A:N/A] Volume: (cm) Procedures Performed: Compression Therapy [5:Debridement] [N/A:N/A] Treatment Notes Electronic Signature(s) Signed: 03/08/2019 5:57:33 PM By: Linton Ham MD Signed: 03/08/2019 6:00:40 PM By: Levan Hurst  RN, BSN Entered By: Linton Ham on 03/08/2019 10:32:47 -------------------------------------------------------------------------------- Multi-Disciplinary Care Plan Details Patient Name: Date of Service: [name] 03/08/2019 9:00 AM Medical Record [id number] Patient Account Number: [id number] Date of Birth/Sex: Treating RN: 1930/10/20 (85 y.o. [name] Primary Care Emelyn Roen: Salena Saner Other Clinician: Referring Machi Whittaker: Treating Aran Menning/Extender:Robson, Rhona Leavens, Doristine Locks in Treatment: 13 Active Inactive Wound/Skin Impairment Nursing Diagnoses: Impaired tissue integrity Knowledge deficit related to ulceration/compromised skin integrity Goals: Patient/caregiver will verbalize understanding of skin care regimen Date Initiated: 12/07/2018 Target Resolution Date: 04/02/2019 Goal Status: Active Ulcer/skin breakdown will have a volume reduction of 30% by week 4 Date Initiated: 12/07/2018 Date Inactivated: 01/18/2019 Target Resolution Date: 01/08/2019 Goal Status: Met Interventions: Assess patient/caregiver ability to obtain necessary supplies Assess patient/caregiver ability to perform ulcer/skin care regimen upon admission and as needed Assess ulceration(s) every visit Provide education on ulcer and skin care Notes: Electronic Signature(s) Signed: 03/11/2019 5:16:49 PM By: Kela Millin Entered By: Kela Millin on 03/08/2019 09:41:51 -------------------------------------------------------------------------------- Pain Assessment Details Patient Name: Date of Service: ILLA, ENLOW 03/08/2019 9:00 AM Medical Record [id number] Patient Account Number: [id number] Date of Birth/Sex: Treating RN: Sep 13, 1930 (85 y.o. [name] Primary Care Keoki Mchargue: Salena Saner Other Clinician: Referring Burnis Kaser: Treating Tiron Suski/Extender:Robson, Rhona Leavens, Doristine Locks in Treatment: 13 Active  Problems Location of Pain Severity and Description of Pain Patient Has Paino No Site Locations Rate the pain. Current Pain Level: 0 Pain Management and Medication Current Pain Management: Medication: No Cold Application: No Rest: No Massage: No Activity: No T.E.N.S.: No Heat Application: No Leg drop or elevation: No Is the Current Pain Management Adequate: Adequate How does your wound impact your activities of daily livingo Sleep: No Bathing: No Appetite: No Relationship With Others: No Bladder Continence: No Emotions: No Bowel Continence: No Work: No Toileting: No Drive: No Dressing: No Hobbies: No Electronic Signature(s) Signed: 03/08/2019 5:49:34 PM By: Deon Pilling Entered By: Deon Pilling on 03/08/2019 10:09:12 -------------------------------------------------------------------------------- Patient/Caregiver Education Details Patient Name: Date of Service: [name] 11/2/2020andnbsp9:00 AM Medical Record [id number] Patient Account Number: [id number] Date of Birth/Gender: Treating RN: 1931-04-30 (85 y.o. [name] Primary Care Physician: Salena Saner Other Clinician: Referring Physician: Treating Physician/Extender:Robson, Rhona Leavens, Doristine Locks in Treatment: 13 Education Assessment Education Provided To: Patient Education Topics Provided Wound/Skin Impairment: Methods: Explain/Verbal Responses: State content correctly Electronic Signature(s) Signed: 03/11/2019 5:16:49 PM By: Kela Millin Entered By: Kela Millin on 03/08/2019 09:42:38 -------------------------------------------------------------------------------- Wound Assessment Details Patient Name: Date of Service: [name] 03/08/2019 9:00 AM Medical Record [id number] Patient Account Number: [id number] Date of Birth/Sex: Treating RN: 01-26-1931 (85 y.o. [name] Primary Care Erastus Bartolomei: Salena Saner Other  Clinician: Referring Danitza Schoenfeldt: Treating Tatem Holsonback/Extender:Robson, Rhona Leavens, Doristine Locks in Treatment: 13 Wound Status Wound Number:  5 Primary Etiology: Vasculopathy Wound Location: Right Lower Leg - Medial Wound Status: Open Wounding Event: Gradually Appeared Comorbid History: Deep Vein Thrombosis, Hypertension Date Acquired: 11/04/2018 Weeks Of Treatment: 13 Clustered Wound: No Photos Wound Measurements Length: (cm) 0.8 % Reduct Width: (cm) 0.6 % Reduct Depth: (cm) 0.2 Epitheli Area: (cm) 0.377 Tunneli Volume: (cm) 0.075 Undermi Wound Description Full Thickness Without Exposed Support Foul Od Classification: Structures Slough/ Wound Distinct, outline attached Margin: Exudate Small Amount: Exudate Serosanguineous Type: Exudate red, brown Color: Wound Bed Granulation Amount: Large (67-100%) Granulation Quality: Pink, Pale Fascia Necrotic Amount: Small (1-33%) Fat Lay Necrotic Quality: Adherent Slough Tendon Muscle Joint E Bone Ex or After Cleansing: No Fibrino Yes Exposed Structure Exposed: No er (Subcutaneous Tissue) Exposed: Yes Exposed: No Exposed: No xposed: No posed: No ion in Area: 90.6% ion in Volume: 90.6% alization: Medium (34-66%) ng: No ning: No Electronic Signature(s) Signed: 03/10/2019 3:21:07 PM By: Mikeal Hawthorne EMT/HBOT Signed: 03/10/2019 6:30:51 PM By: Deon Pilling Previous Signature: 03/08/2019 5:49:34 PM Version By: Deon Pilling Entered By: Mikeal Hawthorne on 03/10/2019 09:03:58 -------------------------------------------------------------------------------- Vitals Details Patient Name: Date of Service: [name] 03/08/2019 9:00 AM Medical Record [id number] Patient Account Number: [id number] Date of Birth/Sex: Treating RN: Jul 09, 1930 (85 y.o. [name], Meta.Reding Primary Care Takeo Harts: Salena Saner Other Clinician: Referring Chelli Yerkes: Treating Britta Louth/Extender:Robson, Rhona Leavens, Doristine Locks  in Treatment: 13 Vital Signs Time Taken: 10:07 Temperature (F): 97.6 Height (in): 62 Pulse (bpm): 42 Weight (lbs): 158 Respiratory Rate (breaths/min): 16 Body Mass Index (BMI): 28.9 Blood Pressure (mmHg): 135/54 Reference Range: 80 - 120 mg / dl Notes rechecked heart rate manual 42. Patient denies, pain, dizziness, blurry vision, or lightheaded. Electronic Signature(s) Signed: 03/08/2019 5:49:34 PM By: Deon Pilling Entered By: Deon Pilling on 03/08/2019 10:10:26

## 2019-03-15 ENCOUNTER — Other Ambulatory Visit: Payer: Self-pay

## 2019-03-15 ENCOUNTER — Encounter (HOSPITAL_BASED_OUTPATIENT_CLINIC_OR_DEPARTMENT_OTHER): Payer: Medicare Other | Admitting: Internal Medicine

## 2019-03-15 DIAGNOSIS — I890 Lymphedema, not elsewhere classified: Secondary | ICD-10-CM | POA: Diagnosis not present

## 2019-03-15 NOTE — Progress Notes (Addendum)
[name], [name] ([id number]) Visit Report for 03/15/2019 Arrival Information Details Patient Name: Date of Service: [name], [name] 03/15/2019 9:00 AM Medical Record [id number] Patient Account Number: [id number] Date of Birth/Sex: Treating RN: 10/07/1930 (85 y.o. [name], [name] Primary Care [name]: Salena Saner Other Clinician: Referring [name]: Treating [name]/Extender:Robson, [name], [name] in Treatment: 14 Visit Information History Since Last Visit Added or deleted any medications: No Patient Arrived: [name] Any new allergies or adverse reactions: No Arrival Time: 09:14 daughter Had a fall or experienced change in No Accompanied By: activities of daily living that may affect Transfer Assistance: None risk of falls: Patient Identification Verified: Yes Signs or symptoms of abuse/neglect since last No Secondary Verification Process Completed: Yes visito Patient Requires Transmission-Based No Hospitalized since last visit: No Precautions: Implantable device outside of the clinic excluding No Patient Has Alerts: No cellular tissue based products placed in the center since last visit: Has Dressing in Place as Prescribed: Yes Has Compression in Place as Prescribed: Yes Pain Present Now: No Electronic Signature(s) Signed: 03/15/2019 5:35:58 PM By: Kela Millin Entered By: Kela Millin on 03/15/2019 09:14:34 -------------------------------------------------------------------------------- Compression Therapy Details Patient Name: Date of Service: Tami Slade D. 03/15/2019 9:00 AM Medical Record [id number] Patient Account Number: [id number] Date of Birth/Sex: Treating RN: 1930/10/25 (85 y.o. [name] Primary Care [name]: Salena Saner Other Clinician: Referring Jw Covin: Treating Astaria Nanez/Extender:Robson, [name], [name] in Treatment: 14 Compression Therapy Performed for Wound Wound #5  Right,Medial Lower Leg Assessment: Performed By: Clinician Tami Hurst, RN Compression Type: Three Layer Post Procedure Diagnosis Same as Pre-procedure Electronic Signature(s) Signed: 03/15/2019 5:46:53 PM By: Tami Hurst RN, BSN Entered By: [name] on 03/15/2019 10:05:19 -------------------------------------------------------------------------------- Encounter Discharge Information Details Patient Name: Date of Service: Tami Slade D. 03/15/2019 9:00 AM Medical Record [id number] Patient Account Number: [id number] Date of Birth/Sex: Treating RN: 08-23-1930 (85 y.o. [name] Primary Care Rema Lievanos: Salena Saner Other Clinician: Referring Arora Coakley: Treating Tredarius Cobern/Extender:Robson, [name], [name] in Treatment: 14 Encounter Discharge Information Items Post Procedure Vitals Discharge Condition: Stable Temperature (F): 98.2 Ambulatory Status: Cane Pulse (bpm): 45 Discharge Destination: Home Respiratory Rate (breaths/min): 17 Transportation: Private Auto Blood Pressure (mmHg): 117/45 Accompanied By: mother Schedule Follow-up Appointment: Yes Clinical Summary of Care: Electronic Signature(s) Signed: 03/15/2019 3:33:55 PM By: Deon Pilling Entered By: Deon Pilling on 03/15/2019 10:14:20 -------------------------------------------------------------------------------- Lower Extremity Assessment Details Patient Name: Date of Service: [name], [name] 03/15/2019 9:00 AM Medical Record [id number] Patient Account Number: [id number] Date of Birth/Sex: Treating RN: 1931/04/17 (85 y.o. [name] Primary Care Tereasa Yilmaz: Salena Saner Other Clinician: Referring Arlethia Basso: Treating Bennet Kujawa/Extender:Robson, [name], [name] in Treatment: 14 Edema Assessment Assessed: [Left: No] [Right: No] Edema: [Left: Ye] [Right: s] Calf Left: Right: Point of Measurement: 35 cm From Medial Instep cm 33.5  cm Ankle Left: Right: Point of Measurement: 10 cm From Medial Instep cm 22 cm Vascular Assessment Pulses: Dorsalis Pedis Palpable: [Right:Yes] Electronic Signature(s) Signed: 03/15/2019 5:35:58 PM By: Kela Millin Entered By: Kela Millin on 03/15/2019 09:22:05 -------------------------------------------------------------------------------- Multi Wound Chart Details Patient Name: Date of Service: Tami Slade D. 03/15/2019 9:00 AM Medical Record [id number] Patient Account Number: [id number] Date of Birth/Sex: Treating RN: 1931-04-02 (85 y.o. [name] Primary Care Ezell Poke: Salena Saner Other Clinician: Referring Ade Stmarie: Treating Juaquin Ludington/Extender:Robson, [name], [name] in Treatment: 14 Vital Signs Height(in): 62 Pulse(bpm): 45 Weight(lbs): 158 Blood Pressure(mmHg): 117/45 Body Mass Index(BMI): 29 Temperature(F): 98.2 Respiratory 17 Rate(breaths/min): Photos: [  5:No Photos] [N/A:N/A] Wound Location: [5:Right Lower Leg - Medial] [N/A:N/A] Wounding Event: [5:Gradually Appeared] [N/A:N/A] Primary Etiology: [5:Vasculopathy] [N/A:N/A] Comorbid History: [5:Deep Vein Thrombosis, Hypertension] [N/A:N/A] Date Acquired: [5:11/04/2018] [N/A:N/A] Weeks of Treatment: [5:14] [N/A:N/A] Wound Status: [5:Open] [N/A:N/A] Measurements L x W x D 0.7x0.7x0.2 [N/A:N/A] (cm) Area (cm) : [5:0.385] [N/A:N/A] Volume (cm) : [5:0.077] [N/A:N/A] % Reduction in Area: [5:90.40%] [N/A:N/A] % Reduction in Volume: 90.40% [N/A:N/A] Classification: [5:Full Thickness Without Exposed Support Structures] [N/A:N/A] Exudate Amount: [5:Small] [N/A:N/A N/A] Exudate Type: [5:Serosanguineous] [N/A:N/A N/A] Exudate Color: [5:red, brown] [N/A:N/A N/A] Wound Margin: [5:Well defined, not attached N/A] [N/A:N/A] Granulation Amount: [5:Large (67-100%)] [N/A:N/A N/A] Granulation Quality: [5:Pink, Pale] [N/A:N/A N/A] Necrotic Amount: [5:Small (1-33%)] [N/A:N/A  N/A] Exposed Structures: [5:Fat Layer (Subcutaneous N/A Tissue) Exposed: Yes Fascia: No Tendon: No Muscle: No Joint: No Bone: No] [N/A:N/A] Epithelialization: [5:Medium (34-66%)] [N/A:N/A N/A] Debridement: [5:Debridement - Excisional N/A] [N/A:N/A] Pre-procedure [5:10:02] [N/A:N/A N/A] Verification/Time Out Taken: Pain Control: [5:Lidocaine 4% Topical Solution] [N/A:N/A N/A] Tissue Debrided: [5:Subcutaneous, Slough] [N/A:N/A N/A] Level: [5:Skin/Subcutaneous Tissue] [N/A:N/A N/A] Debridement Area (sq cm):0.49 [N/A:N/A N/A] Instrument: [5:Curette] [N/A:N/A N/A] Bleeding: [5:Minimum] [N/A:N/A N/A] Hemostasis Achieved: [5:Pressure] [N/A:N/A N/A] Procedural Pain: [5:0] [N/A:N/A N/A] Post Procedural Pain: [5:0] [N/A:N/A N/A] Debridement Treatment Procedure was tolerated [N/A:N/A N/A] Response: [5:well] Post Debridement [5:0.7x0.7x0.2] [N/A:N/A N/A] Measurements L x W x D (cm) Post Debridement [5:0.077] [N/A:N/A N/A] Volume: (cm) Procedures Performed: Compression Therapy [5:Debridement] [N/A:N/A N/A] Treatment Notes Wound #5 (Right, Medial Lower Leg) 1. Cleanse With Wound Cleanser Soap and water 2. Periwound Care Moisturizing lotion 3. Primary Dressing Applied Endoform 4. Secondary Dressing Dry Gauze 6. Support Layer Applied 3 layer compression wrap Notes stretch net Electronic Signature(s) Signed: 03/15/2019 5:25:49 PM By: Linton Ham MD Signed: 03/15/2019 5:46:53 PM By: Tami Hurst RN, BSN Entered By: Linton Ham on 03/15/2019 10:43:47 -------------------------------------------------------------------------------- [place] Details Patient Name: Date of Service: Tami Slade D. 03/15/2019 9:00 AM Medical Record [id number] Patient Account Number: [id number] Date of Birth/Sex: Treating RN: [birth year] (85 y.o. [name] Primary Care Trinten Boudoin: Salena Saner Other Clinician: Referring Josalynn Johndrow: Treating  Lenee Franze/Extender:Robson, [name], [name] in Treatment: 14 Active Inactive Wound/Skin Impairment Nursing Diagnoses: Impaired tissue integrity Knowledge deficit related to ulceration/compromised skin integrity Goals: Patient/caregiver will verbalize understanding of skin care regimen Date Initiated: 12/07/2018 Target Resolution Date: 04/02/2019 Goal Status: Active Ulcer/skin breakdown will have a volume reduction of 30% by week 4 Date Initiated: 12/07/2018 Date Inactivated: 01/18/2019 Target Resolution Date: 01/08/2019 Goal Status: Met Interventions: Assess patient/caregiver ability to obtain necessary supplies Assess patient/caregiver ability to perform ulcer/skin care regimen upon admission and as needed Assess ulceration(s) every visit Provide education on ulcer and skin care Notes: Electronic Signature(s) Signed: 03/15/2019 5:46:53 PM By: Tami Hurst RN, BSN Entered By: [name] on 03/15/2019 09:35:01 -------------------------------------------------------------------------------- Pain Assessment Details Patient Name: Date of Service: Tami Slade D. 03/15/2019 9:00 AM Medical Record [id number] Patient Account Number: [id number] Date of Birth/Sex: Treating RN: 1931/01/27 (85 y.o. [name] Primary Care Wilian Kwong: Salena Saner Other Clinician: Referring Dayvin Aber: Treating Jenet Durio/Extender:Robson, [name], [name] in Treatment: 14 Active Problems Location of Pain Severity and Description of Pain Patient Has Paino No Site Locations Pain Management and Medication Current Pain Management: Electronic Signature(s) Signed: 03/15/2019 5:35:58 PM By: Kela Millin Entered By: Kela Millin on 03/15/2019 09:21:55 -------------------------------------------------------------------------------- Patient/Caregiver Education Details Patient Name: Date of Service: [name] 11/9/2020andnbsp9:00 AM Medical  Record [id number] Patient Account Number: [id number] Date of Birth/Gender: Treating RN: [birth year] (85 y.o.  [name] Primary Care Physician: Salena Saner Other Clinician: Referring Physician: Treating Physician/Extender:Robson, [name], [name] in Treatment: 14 Education Assessment Education Provided To: Patient Education Topics Provided Wound/Skin Impairment: Methods: Explain/Verbal Responses: State content correctly [employer]) Signed: 03/15/2019 5:46:53 PM By: Tami Hurst RN, BSN Entered By: [name] on 03/15/2019 09:35:16 -------------------------------------------------------------------------------- Wound Assessment Details Patient Name: Date of Service: Tami Slade D. 03/15/2019 9:00 AM Medical Record [id number] Patient Account Number: [id number] Date of Birth/Sex: Treating RN: 24-Mar-1931 (85 y.o. [name] Primary Care Seraphim Affinito: Salena Saner Other Clinician: Referring Keagan Brislin: Treating Riccardo Holeman/Extender:Robson, [name], [name] in Treatment: 14 Wound Status Wound Number: 5 Primary Etiology: Vasculopathy Wound Location: Right Lower Leg - Medial Wound Status: Open Wounding Event: Gradually Appeared Comorbid History: Deep Vein Thrombosis, Hypertension Date Acquired: 11/04/2018 Weeks Of Treatment: 14 Clustered Wound: No Photos Wound Measurements Length: (cm) 0.7 % Reduct Width: (cm) 0.7 % Reduct Depth: (cm) 0.2 Epitheli Area: (cm) 0.385 Tunneli Volume: (cm) 0.077 Undermi Wound Description Classification: Full Thickness Without Exposed Support Foul Od Structures Slough/ Wound Well defined, not attached Margin: Exudate Small Amount: Exudate Serosanguineous Type: Exudate red, brown Color: Wound Bed Granulation Amount: Large (67-100%) Granulation Quality: Pink, Pale Fascia Ex Necrotic Amount: Small (1-33%) Fat Layer Necrotic Quality: Adherent Slough Tendon  Ex Muscle Ex Joint Exp Bone Expo or After Cleansing: No Fibrino Yes Exposed Structure posed: No (Subcutaneous Tissue) Exposed: Yes posed: No posed: No osed: No sed: No ion in Area: 90.4% ion in Volume: 90.4% alization: Medium (34-66%) ng: No ning: No Treatment Notes Wound #5 (Right, Medial Lower Leg) 1. Cleanse With Wound Cleanser Soap and water 2. Periwound Care Moisturizing lotion 3. Primary Dressing Applied Endoform 4. Secondary Dressing Dry Gauze 6. Support Layer Applied 3 layer compression wrap Notes stretch net Electronic Signature(s) Signed: 03/16/2019 4:05:15 PM By: Mikeal Hawthorne EMT/HBOT Signed: 03/19/2019 5:20:59 PM By: Kela Millin Previous Signature: 03/15/2019 5:35:58 PM Version By: Kela Millin Entered By: Mikeal Hawthorne on 03/16/2019 09:09:29 -------------------------------------------------------------------------------- Vitals Details Patient Name: Date of Service: Tami Slade D. 03/15/2019 9:00 AM Medical Record [id number] Patient Account Number: [id number] Date of Birth/Sex: Treating RN: 1930/06/28 (85 y.o. [name] Primary Care Javaun Dimperio: Salena Saner Other Clinician: Referring Abubakr Wieman: Treating Khyleigh Furney/Extender:Robson, [name], [name] in Treatment: 14 Vital Signs Time Taken: 09:14 Temperature (F): 98.2 Height (in): 62 Pulse (bpm): 45 Weight (lbs): 158 Respiratory Rate (breaths/min): 17 Body Mass Index (BMI): 28.9 Blood Pressure (mmHg): 117/45 Reference Range: 80 - 120 mg / dl Electronic Signature(s) Signed: 03/15/2019 5:35:58 PM By: Kela Millin Entered By: Kela Millin on 03/15/2019 09:17:38

## 2019-03-15 NOTE — Progress Notes (Signed)
[name], Tami D. ([id number]) Visit Report for 03/15/2019 Debridement Details Patient Name: Date of Service: [name], Tami D. 03/15/2019 9:00 AM Medical Record [id number] Patient Account Number: [id number] Date of Birth/Sex: Treating RN: 09/30/1930 (85 y.o. [name]) [name], [name] Primary Care Provider: [name], [name] Other Clinician: Referring Provider: Treating Provider/Extender:[name], [name], [name] in Treatment: 14 Debridement Performed for Wound #5 Right,Medial Lower Leg Assessment: Performed By: Physician Maxwell Caulobson, Blayke Cordrey G., MD Debridement Type: Debridement Level of Consciousness (Pre- Awake and Alert procedure): Pre-procedure Yes - 10:02 Verification/Time Out Taken: Start Time: 10:02 Pain Control: Lidocaine 4% Topical Solution Total Area Debrided (L x W): 0.7 (cm) x 0.7 (cm) = 0.49 (cm) Tissue and other material Viable, Non-Viable, Slough, Subcutaneous, Slough debrided: Level: Skin/Subcutaneous Tissue Debridement Description: Excisional Instrument: Curette Bleeding: Minimum Hemostasis Achieved: Pressure End Time: 10:03 Procedural Pain: 0 Post Procedural Pain: 0 Response to Treatment: Procedure was tolerated well Level of Consciousness Awake and Alert (Post-procedure): Post Debridement Measurements of Total Wound Length: (cm) 0.7 Width: (cm) 0.7 Depth: (cm) 0.2 Volume: (cm) 0.077 Character of Wound/Ulcer Post Improved Debridement: Post Procedure Diagnosis Same as Pre-procedure Electronic Signature(s) Signed: 03/15/2019 5:25:49 PM By: Baltazar Najjarobson, Azia Toutant MD Signed: 03/15/2019 5:46:53 PM By: Tami AbtsLynch, Shatara RN, BSN Entered By: Baltazar Najjarobson, Irby Fails on 03/15/2019 10:43:57 -------------------------------------------------------------------------------- HPI Details Patient Name: Date of Service: [name], Tami D. 03/15/2019 9:00 AM Medical Record [id number] Patient Account Number: [id number] Date of Birth/Sex: Treating RN: 04/30/1931 (85 y.o. 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 Patient was given a course of doxycycline for 7 days that she completed, has been putting mupirocin cream on the wounds, leaving it exposed at night and covering with dry gauze during the day. Overall patient who is present with her daughter in the clinic today states that the 2 wounds have been improving to the present size. There is also some seepage in and around the wound area. Both legs are swollen. No changes to dimensions of legs overall in the recent past. Patient was remotely seen here in the clinic in 2014 for right lower leg tibial wounds very similar to the ones were seeing today. 8/10-Patient returns after being initiated in the wound clinic last week. The right medial leg wounds about the right ankle are both slightly smaller in size, not much seepage, legs are not as swollen we are using a 3 layer compression of the right juxta lite on the left she denies any significant pain 8/17; the patient has 2 small open areas on the medial right leg. She has good edema control. There is no doubt she has underlying lymphedema. Still a lot of adherent debris to both wounds 8/24; same 2 small open areas on the medial right leg. Edema control. We have been using Iodoflex since last week and the surface of the wound looks some better. 9/14; 2 small openings on the right medial lower leg. The patient went for her venous reflux studies in [place] [facility]. 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 The findings were similar to when examined 2016. There was normal appearance of the great saphenous veins and short  saphenous veins bilaterally no evidence for superficial venous reflux and no varicosities. No indication for venous ablation We have been using Iodoflex. Changed to Sorbact today 9/21; 2 small openings in close juxtaposition on the right medial lower leg. She had difficulties this week with her 3 layer compression becoming too tight she took this off late last Wednesday/Thursday morning. She did not put anything else on her leg quite a bit of nonpitting/lymphedema today still using Sorbact 9/28; 2 small wounds in close juxtaposition in the right medial lower leg. We have been using Sorbact. Changed to endoform today 10/5; 2 small wounds in close juxtaposition the right medial lower leg I changed to endoform last week unfortunately not much improvement 10/12; 2 small wounds in close juxtaposition on the right lower leg in the setting of severe chronic venous insufficiency secondary to prior DVTs. 10/19; 2 small wounds in close juxtaposition in the right lower leg in the setting of severe chronic venous insufficiency secondary to prior DVTs. The more anterior one is closed however the more posterior one still has not changed that much. Considerable depth. We have been using endoform 10/26; not much change in the 1 remaining wound. Still deep and punched out looking. Adherent surface slough. We have been using endoform. We have been trying to run Grafix PL but have not heard a response yet 11/2; wound is measuring slightly smaller. Depth looks like it is come in better. Better looking surface although still requiring a light debridement. Grafix PL not covered by [place] [facility], we will run Oasis 11/9; not much change in dimensions although the wound cleans up quite well. Still have not heard about [facility] using endoform Electronic  Signature(s) Signed: 03/15/2019 5:25:49 PM By: Baltazar Najjar MD Entered By: Baltazar Najjar on 03/15/2019 10:45:09 -------------------------------------------------------------------------------- Physical Exam Details Patient Name: Date of Service: Tami Comber D. 03/15/2019 9:00 AM Medical Record [id number] Patient Account Number: [id number] Date of Birth/Sex: Treating RN: 11/25/1930 (85 y.o. Tami Link Primary Care Provider: Alva Garnet Other Clinician: Referring Provider: Treating Provider/Extender:Jenasia Dolinar, Tami Coulson, Tami Andrew in Treatment: 14 Notes 11/9; wound exam; right medial lower leg. Small punched out area. Using #3 curette still debridement of fibrinous debris although this comes off easier and the surface of the wound looks better. No evidence of surrounding infection or ischemia Electronic Signature(s) Signed: 03/15/2019 5:25:49 PM By: Baltazar Najjar MD Entered By: Baltazar Najjar on 03/15/2019 10:46:05 -------------------------------------------------------------------------------- Physician Orders Details Patient Name: Date of Service: Tami Comber D. 03/15/2019 9:00 AM Medical Record [id number] Patient Account Number: [id number] Date of Birth/Sex: Treating RN: 02-11-1931 (85 y.o. Tami Link Primary Care Provider: Alva Garnet Other Clinician: Referring Provider: Treating Provider/Extender:Chasitie Passey, Tami Amores, Tami Andrew in Treatment: 14 Verbal / Phone Orders: No Diagnosis Coding ICD-10 Coding Code Description L97.312 Non-pressure chronic ulcer of right ankle with fat layer exposed I89.0 Lymphedema, not elsewhere classified I87.2 Venous insufficiency (chronic) (peripheral) I70.235 Atherosclerosis of native arteries of right leg with ulceration of other part of foot Follow-up Appointments Return Appointment in 1 week. Dressing Change Frequency Wound #5 Right,Medial Lower Leg Do not change entire  dressing for one week. Skin Barriers/Peri-Wound Care Wound #5 Right,Medial Lower Leg Moisturizing lotion Wound Cleansing Wound #5 Right,Medial Lower Leg May shower with protection. Primary Wound Dressing Wound #5 Right,Medial Lower Leg Endoform - moisten with saline Secondary Dressing Wound #5 Right,Medial Lower Leg Dry Gauze Edema Control 3 Layer Compression System - Right Lower Extremity Avoid standing for long periods of time Elevate  legs to the level of the heart or above for 30 minutes daily and/or when sitting, a frequency of: - throughout the day Exercise regularly Support Garment 20-30 mm/Hg pressure to: - Juxtalite to left leg Electronic Signature(s) Signed: 03/15/2019 5:25:49 PM By: Baltazar Najjarobson, Alick Lecomte MD Signed: 03/15/2019 5:46:53 PM By: Tami AbtsLynch, Shatara RN, BSN Entered By: Tami AbtsLynch, [name] on 03/15/2019 09:34:51 -------------------------------------------------------------------------------- Problem List Details Patient Name: Date of Service: [name], Lameka D. 03/15/2019 9:00 AM Medical Record [id number] Patient Account Number: [id number] Date of Birth/Sex: Treating RN: 06/02/1930 (85 y.o. [name]) [name], [name] Primary Care Provider: [name], [name] Other Clinician: Referring Provider: Treating Provider/Extender:Jaileigh Weimer, [name], [name] in Treatment: 14 Active Problems ICD-10 Evaluated Encounter Code Description Active Date Today Diagnosis L97.312 Non-pressure chronic ulcer of right ankle with fat layer 12/07/2018 No Yes exposed I89.0 Lymphedema, not elsewhere classified 12/21/2018 No Yes I87.2 Venous insufficiency (chronic) (peripheral) 12/07/2018 No Yes I70.235 Atherosclerosis of native arteries of right leg with 02/01/2019 No Yes ulceration of other part of foot Inactive Problems Resolved Problems Electronic Signature(s) Signed: 03/15/2019 5:25:49 PM By: Baltazar Najjarobson, Esra Frankowski MD Entered By: Baltazar Najjarobson, Eula Mazzola on 03/15/2019  10:43:40 -------------------------------------------------------------------------------- Progress Note Details Patient Name: Date of Service: [name], Stephene D. 03/15/2019 9:00 AM Medical Record [id number] Patient Account Number: [id number] Date of Birth/Sex: Treating RN: 07/26/1930 (85 y.o. Dorthula PerfectF) [name], Emeterio ReeveShatara Primary Care Provider: [name], [name] Other Clinician: Referring Provider: Treating Provider/Extender:Marshea Wisher, [name], [name] in Treatment: 14 Subjective History of Present Illness (HPI) Pleasant 85 year old African-American female with history of bilateral DVTs with presumed history of hypercoagulable state, with venous insufficiency as a result, presenting with 3-week onset of right medial leg wounds just above the right ankle of 3 Landry duration. Patient was given a course of doxycycline for 7 days that she completed, has been putting mupirocin cream on the wounds, leaving it exposed at night and covering with dry gauze during the day. Overall patient who is present with her daughter in the clinic today states that the 2 wounds have been improving to the present size. There is also some seepage in and around the wound area. Both legs are swollen. No changes to dimensions of legs overall in the recent past. Patient was remotely seen here in the clinic in 2014 for right lower leg tibial wounds very similar to the ones were seeing today. 8/10-Patient returns after being initiated in the wound clinic last week. The right medial leg wounds about the right ankle are both slightly smaller in size, not much seepage, legs are not as swollen we are using a 3 layer compression of the right juxta lite on the left she denies any significant pain 8/17; the patient has 2 small open areas on the medial right leg. She has good edema control. There is no doubt she has underlying lymphedema. Still a lot of adherent debris to both wounds 8/24; same 2 small open areas on the  medial right leg. Edema control. We have been using Iodoflex since last week and the surface of the wound looks some better. 9/14; 2 small openings on the right medial lower leg. The patient went for her venous reflux studies in [place] [facility]. 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 The findings were similar to when examined 2016. There was normal appearance of the great saphenous veins and short saphenous veins bilaterally no evidence for superficial venous reflux and no varicosities. No indication for venous ablation We have been using Iodoflex. Changed  to Sorbact today 9/21; 2 small openings in close juxtaposition on the right medial lower leg. She had difficulties this week with her 3 layer compression becoming too tight she took this off late last Wednesday/Thursday morning. She did not put anything else on her leg quite a bit of nonpitting/lymphedema today still using Sorbact 9/28; 2 small wounds in close juxtaposition in the right medial lower leg. We have been using Sorbact. Changed to endoform today 10/5; 2 small wounds in close juxtaposition the right medial lower leg I changed to endoform last week unfortunately not much improvement 10/12; 2 small wounds in close juxtaposition on the right lower leg in the setting of severe chronic venous insufficiency secondary to prior DVTs. 10/19; 2 small wounds in close juxtaposition in the right lower leg in the setting of severe chronic venous insufficiency secondary to prior DVTs. The more anterior one is closed however the more posterior one still has not changed that much. Considerable depth. We have been using endoform 10/26; not much change in the 1 remaining wound. Still deep and punched out looking. Adherent surface slough. We have been using endoform. We have been trying to run Grafix PL but have not heard a response yet 11/2; wound is measuring slightly smaller. Depth  looks like it is come in better. Better looking surface although still requiring a light debridement. Grafix PL not covered by [place] [facility], we will run O[facility] 11/9; not much change in dimensions although the wound cleans up quite well. Still have not heard about [facility] using endoform Objective Constitutional Vitals Time Taken: 9:14 AM, Height: 62 in, Weight: 158 lbs, BMI: 28.9, Temperature: 98.2 F, Pulse: 45 bpm, Respiratory Rate: 17 breaths/min, Blood Pressure: 117/45 mmHg. Integumentary (Hair, Skin) Wound #5 status is Open. Original cause of wound was Gradually Appeared. The wound is located on the Right,Medial Lower Leg. The wound measures 0.7cm length x 0.7cm width x 0.2cm depth; 0.385cm^2 area and 0.077cm^3 volume. There is Fat Layer (Subcutaneous Tissue) Exposed exposed. There is no tunneling or undermining noted. There is a small amount of serosanguineous drainage noted. The wound margin is well defined and not attached to the wound base. There is large (67-100%) pink, pale granulation within the wound bed. There is a small (1-33%) amount of necrotic tissue within the wound bed including Adherent Slough. 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 There was a Excisional Skin/Subcutaneous Tissue Debridement with a total area of 0.49 sq cm performed by Ricard Dillon., MD. With the following instrument(s): Curette to remove Viable and Non-Viable tissue/material. Material removed includes Subcutaneous Tissue and Slough and after achieving pain control using Lidocaine 4% Topical Solution. No specimens were taken. A time out was conducted at 10:02,  prior to the start of the procedure. A Minimum amount of bleeding was controlled with Pressure. The procedure was tolerated well with a pain level of 0 throughout and a pain level of 0 following the procedure. Post Debridement Measurements: 0.7cm length x 0.7cm width x 0.2cm depth; 0.077cm^3 volume. Character of Wound/Ulcer Post Debridement is improved. Post procedure Diagnosis Wound #5: Same as Pre-Procedure Pre-procedure diagnosis of Wound #5 is a Vasculopathy located on the Right,Medial Lower Leg . There was a Three Layer Compression Therapy  Procedure by Tami Abts, RN. Post procedure Diagnosis Wound #5: Same as Pre-Procedure Plan Follow-up Appointments: Return Appointment in 1 week. Dressing Change Frequency: Wound #5 Right,Medial Lower Leg: Do not change entire dressing for one week. Skin Barriers/Peri-Wound Care: Wound #5 Right,Medial Lower Leg: Moisturizing lotion Wound Cleansing: Wound #5 Right,Medial Lower Leg: May shower with protection. Primary Wound Dressing: Wound #5 Right,Medial Lower Leg: Endoform - moisten with saline Secondary Dressing: Wound #5 Right,Medial Lower Leg: Dry Gauze Edema Control: 3 Layer Compression System - Right Lower Extremity Avoid standing for long periods of time Elevate legs to the level of the heart or above for 30 minutes daily and/or when sitting, a frequency of: - throughout the day Exercise regularly Support Garment 20-30 mm/Hg pressure to: - Juxtalite to left leg 1. Continue with the endoform under 3 layer compression 2. Still awaiting word on Oasis I believe from [employer]) Signed: 03/15/2019 5:25:49 PM By: Baltazar Najjar MD Entered By: Baltazar Najjar on 03/15/2019 10:46:37 -------------------------------------------------------------------------------- SuperBill Details Patient Name: Date of Service: SHAQUNA, GEIGLE 03/15/2019 Medical Record [id number] Patient Account Number:  [id number] Date of Birth/Sex: Treating RN: 12/13/1930 (85 y.o. Tami Link Primary Care Provider: Alva Garnet Other Clinician: Referring Provider: Treating Provider/Extender:Damaris Geers, Tami Hallberg, Tami Andrew in Treatment: 14 Diagnosis Coding ICD-10 Codes Code Description [id number] Non-pressure chronic ulcer of right ankle with fat layer exposed I89.0 Lymphedema, not elsewhere classified I87.2 Venous insufficiency (chronic) (peripheral) I70.235 Atherosclerosis of native arteries of right leg with ulceration of other part of foot Facility Procedures CPT4 Code Description: [id number] 11042 - DEB SUBQ TISSUE 20 SQ CM/< ICD-10 Diagnosis Description L97.312 Non-pressure chronic ulcer of right ankle with fat layer expo Modifier: sed Quantity: 1 Physician Procedures CPT4 Code Description: [id number] 11042 - WC PHYS SUBQ TISS 20 SQ CM ICD-10 Diagnosis Description L97.312 Non-pressure chronic ulcer of right ankle with fat layer expo Modifier: sed Quantity: 1 Electronic Signature(s) Signed: 03/15/2019 5:25:49 PM By: Baltazar Najjar MD Entered By: Baltazar Najjar on 03/15/2019 10:47:39

## 2019-03-22 ENCOUNTER — Encounter (HOSPITAL_BASED_OUTPATIENT_CLINIC_OR_DEPARTMENT_OTHER): Payer: Medicare Other | Admitting: Internal Medicine

## 2019-03-22 ENCOUNTER — Other Ambulatory Visit: Payer: Self-pay

## 2019-03-22 DIAGNOSIS — I890 Lymphedema, not elsewhere classified: Secondary | ICD-10-CM | POA: Diagnosis not present

## 2019-03-22 NOTE — Progress Notes (Signed)
[name], [name] ([id number]) Visit Report for 03/22/2019 HPI Details Patient Name: Date of Service: [name], [name] 03/22/2019 9:00 AM Medical Record [id number] Patient Account Number: [id number] Date of Birth/Sex: Treating RN: January 21, 1931 (85 y.o. 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 Patient was given a course of doxycycline for 7 days that she completed, has been putting mupirocin cream on the wounds, leaving it exposed at night and covering with dry gauze during the day. Overall patient who is present with her daughter in the clinic today states that the 2 wounds have been improving to the present size. There is also some seepage in and around the wound area. Both legs are swollen. No changes to dimensions of legs overall in the recent past. Patient was remotely seen here in the clinic in 2014 for right lower leg tibial wounds very similar to the ones were seeing today. 8/10-Patient returns after being initiated in the wound clinic last week. The right medial leg wounds about the right ankle are both slightly smaller in size, not much seepage, legs are not as swollen we are using a 3 layer compression of the right juxta lite on the left she denies any significant pain 8/17; the patient has 2 small open areas on the medial right leg. She has good edema control. There is no doubt she has underlying lymphedema. Still a lot of adherent debris to both wounds 8/24; same 2 small open areas on the medial right leg. Edema control. We have  been using Iodoflex since last week and the surface of the wound looks some better. 9/14; 2 small openings on the right medial lower leg. The patient went for her venous reflux studies in [place] [facility]. 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 The findings were similar to when examined 2016. There was normal appearance of the great saphenous veins and short saphenous veins bilaterally no evidence for superficial venous reflux and no varicosities. No indication for venous ablation We have been using Iodoflex. Changed to Sorbact today 9/21; 2 small openings in close juxtaposition on the right medial lower leg. She had difficulties this week with her 3 layer compression becoming too tight she took this off late last Wednesday/Thursday morning. She did not put anything else on her leg quite a bit of nonpitting/lymphedema today still using Sorbact 9/28; 2 small wounds in close juxtaposition in the right medial lower leg. We have been using Sorbact. Changed to endoform today 10/5; 2 small wounds in close juxtaposition the right medial lower leg I changed to endoform last week unfortunately not much improvement 10/12; 2 small wounds in close juxtaposition on the right lower leg in the setting of severe chronic venous insufficiency secondary to prior DVTs. 10/19; 2 small wounds in close juxtaposition in the right lower leg in the setting of severe chronic venous insufficiency secondary to prior DVTs. The more anterior one is closed however the more posterior one still has not changed that much. Considerable depth. We have been using endoform 10/26; not much change in the 1 remaining wound. Still deep and punched out looking. Adherent surface slough. We  have been using endoform. We have been trying to run Grafix PL but have not heard a response yet 11/2; wound is measuring slightly smaller. Depth looks like it is come in better. Better  looking surface although still requiring a light debridement. Grafix PL not covered by [place] [facility], we will run Oasis 11/9; not much change in dimensions although the wound cleans up quite well. Still have not heard about Oasis using endoform 11/16; the patient has a $200 co-pay for Oasis. She is using endoform and making slow but steady improvements. The wound is cleaning up quite nicely Electronic Signature(s) Signed: 03/22/2019 5:46:17 PM By: Baltazar Najjar MD Entered By: Baltazar Najjar on 03/22/2019 10:06:23 -------------------------------------------------------------------------------- Physical Exam Details Patient Name: Date of Service: Tami Comber D. 03/22/2019 9:00 AM Medical Record [id number] Patient Account Number: [id number] Date of Birth/Sex: Treating RN: August 13, 1930 (85 y.o. Wynelle Link Primary Care Provider: Alva Garnet Other Clinician: Referring Provider: Treating Provider/Extender:Earlie Arciga, Gaylene Madura, Ivonne Andrew in Treatment: 15 Constitutional Sitting or standing Blood Pressure is within target range for patient.. Pulse regular and within target range for patient.[name] Respirations regular, non-labored and within target range.. Temperature is normal and within the target range for the patient.[name] Appears in no distress. Eyes Conjunctivae clear. No discharge.no icterus. Respiratory work of breathing is normal. Cardiovascular Pedal pulses are palpable. Integumentary (Hair, Skin) No evidence of surrounding erythema. Psychiatric appears at normal baseline. Notes Wound exam; right medial lower leg. Small punched out area however this is gradually filling in. Surface looks healthy no debridement is required. She has decent edema control Electronic Signature(s) Signed: 03/22/2019 5:46:17 PM By: Baltazar Najjar MD Entered By: Baltazar Najjar on 03/22/2019  10:07:21 -------------------------------------------------------------------------------- Physician Orders Details Patient Name: Date of Service: Tami Comber D. 03/22/2019 9:00 AM Medical Record [id number] Patient Account Number: [id number] Date of Birth/Sex: Treating RN: 1931/01/08 (85 y.o. Wynelle Link Primary Care Provider: Alva Garnet Other Clinician: Referring Provider: Treating Provider/Extender:Newman Waren, Gaylene Cianci, Ivonne Andrew in Treatment: 15 Verbal / Phone Orders: No Diagnosis Coding ICD-10 Coding Code Description L97.312 Non-pressure chronic ulcer of right ankle with fat layer exposed I89.0 Lymphedema, not elsewhere classified I87.2 Venous insufficiency (chronic) (peripheral) I70.235 Atherosclerosis of native arteries of right leg with ulceration of other part of foot Follow-up Appointments Return Appointment in 2 weeks. - Monday 11/30 Nurse Visit: - 1 week for rewrap Dressing Change Frequency Wound #5 Right,Medial Lower Leg Do not change entire dressing for one week. Skin Barriers/Peri-Wound Care Wound #5 Right,Medial Lower Leg Moisturizing lotion Wound Cleansing Wound #5 Right,Medial Lower Leg May shower with protection. Primary Wound Dressing Wound #5 Right,Medial Lower Leg Endoform - moisten with saline Secondary Dressing Wound #5 Right,Medial Lower Leg Dry Gauze Edema Control 3 Layer Compression System - Right Lower Extremity Avoid standing for long periods of time Elevate legs to the level of the heart or above for 30 minutes daily and/or when sitting, a frequency of: - throughout the day Exercise regularly Support Garment 20-30 mm/Hg pressure to: - Juxtalite to left leg Electronic Signature(s) Signed: 03/22/2019 5:46:17 PM By: Baltazar Najjar MD Signed: 03/22/2019 5:59:11 PM By: Zandra Abts RN, BSN Entered By: Zandra Abts on 03/22/2019  10:00:46 -------------------------------------------------------------------------------- Problem List Details Patient Name: Date of Service: Tami Comber D. 03/22/2019 9:00 AM Medical Record [id number] Patient Account Number: [id number] Date of Birth/Sex: Treating RN: [birth year] (85 y.o. Wynelle Link Primary Care Provider: Alva Garnet Other Clinician: Referring Provider: Treating Provider/Extender:Rashied Corallo, Gaylene Molstad, Ivonne Andrew in Treatment: [phone]  Active Problems ICD-10 Evaluated Encounter Code Description Active Date Today Diagnosis L97.312 Non-pressure chronic ulcer of right ankle with fat layer 12/07/2018 No Yes exposed I89.0 Lymphedema, not elsewhere classified 12/21/2018 No Yes I87.2 Venous insufficiency (chronic) (peripheral) 12/07/2018 No Yes I70.235 Atherosclerosis of native arteries of right leg with 02/01/2019 No Yes ulceration of other part of foot Inactive Problems Resolved Problems Electronic Signature(s) Signed: 03/22/2019 5:46:17 PM By: Baltazar Najjarobson, Alaylah Heatherington MD Entered By: Baltazar Najjarobson, Emmalee Solivan on 03/22/2019 10:04:27 -------------------------------------------------------------------------------- Progress Note Details Patient Name: Date of Service: [name], Tami D. 03/22/2019 9:00 AM Medical Record [name] Patient Account Number: [id number] Date of Birth/Sex: Treating RN: 10/05/1930 (85 y.o. Dorthula PerfectF) [name], Emeterio ReeveShatara Primary Care Provider: Alva GarnetShelton, Kimberly R Other Clinician: Referring Provider: Treating Provider/Extender:Shawanna Zanders, Gaylene BrooksMichael Shelton, Ivonne AndrewKimberly R Weeks in Treatment: 15 Subjective History of Present Illness (HPI) Pleasant 85 year old African-American female with history of bilateral DVTs with presumed history of hypercoagulable state, with venous insufficiency as a result, presenting with 3-week onset of right medial leg wounds just above the right ankle of 3 weeks duration. Patient was given a course of doxycycline for 7 days that she  completed, has been putting mupirocin cream on the wounds, leaving it exposed at night and covering with dry gauze during the day. Overall patient who is present with her daughter in the clinic today states that the 2 wounds have been improving to the present size. There is also some seepage in and around the wound area. Both legs are swollen. No changes to dimensions of legs overall in the recent past. Patient was remotely seen here in the clinic in 2014 for right lower leg tibial wounds very similar to the ones were seeing today. 8/10-Patient returns after being initiated in the wound clinic last week. The right medial leg wounds about the right ankle are both slightly smaller in size, not much seepage, legs are not as swollen we are using a 3 layer compression of the right juxta lite on the left she denies any significant pain 8/17; the patient has 2 small open areas on the medial right leg. She has good edema control. There is no doubt she has underlying lymphedema. Still a lot of adherent debris to both wounds 8/24; same 2 small open areas on the medial right leg. Edema control. We have been using Iodoflex since last week and the surface of the wound looks some better. 9/14; 2 small openings on the right medial lower leg. The patient went for her venous reflux studies in [place] [facility]. 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 The findings were similar to when examined 2016. There was normal appearance of the great saphenous veins and short saphenous veins bilaterally no evidence for superficial venous reflux and no varicosities. No indication for venous ablation We have been using Iodoflex. Changed to Sorbact today 9/21; 2 small openings in close juxtaposition on the right medial lower leg. She had difficulties this week with her 3 layer compression becoming too tight she took this off late last Wednesday/Thursday morning.  She did not put anything else on her leg quite a bit of nonpitting/lymphedema today still using Sorbact 9/28; 2 small wounds in close juxtaposition in the right medial lower leg. We have been using Sorbact. Changed to endoform today 10/5; 2 small wounds in close juxtaposition the right medial lower leg I changed to endoform last week unfortunately not much improvement 10/12; 2 small wounds in close juxtaposition on the right lower leg in the setting of  severe chronic venous insufficiency secondary to prior DVTs. 10/19; 2 small wounds in close juxtaposition in the right lower leg in the setting of severe chronic venous insufficiency secondary to prior DVTs. The more anterior one is closed however the more posterior one still has not changed that much. Considerable depth. We have been using endoform 10/26; not much change in the 1 remaining wound. Still deep and punched out looking. Adherent surface slough. We have been using endoform. We have been trying to run Grafix PL but have not heard a response yet 11/2; wound is measuring slightly smaller. Depth looks like it is come in better. Better looking surface although still requiring a light debridement. Grafix PL not covered by [place] [facility], we will run Oasis 11/9; not much change in dimensions although the wound cleans up quite well. Still have not heard about Oasis using endoform 11/16; the patient has a $200 co-pay for Oasis. She is using endoform and making slow but steady improvements. The wound is cleaning up quite nicely Objective Constitutional Sitting or standing Blood Pressure is within target range for patient.. Pulse regular and within target range for patient.[name] Respirations regular, non-labored and within target range.. Temperature is normal and within the target range for the patient.[name] Appears in no distress. Vitals Time Taken: 9:30 AM, Height: 62 in, Weight: 158 lbs, BMI: 28.9, Temperature: 97.9 F, Pulse: 42  bpm, Respiratory Rate: 18 breaths/min, Blood Pressure: 125/50 mmHg. Eyes Conjunctivae clear. No discharge.no icterus. Respiratory work of breathing is normal. Cardiovascular Pedal pulses are palpable. Psychiatric appears at normal baseline. General Notes: Wound exam; right medial lower leg. Small punched out area however this is gradually filling in. Surface looks healthy no debridement is required. She has decent edema control Integumentary (Hair, Skin) No evidence of surrounding erythema. Wound #5 status is Open. Original cause of wound was Gradually Appeared. The wound is located on the Right,Medial Lower Leg. The wound measures 0.6cm length x 0.6cm width x 0.1cm depth; 0.283cm^2 area and 0.028cm^3 volume. There is Fat Layer (Subcutaneous Tissue) Exposed exposed. There is no tunneling or undermining noted. There is a small amount of serosanguineous drainage noted. The wound margin is well defined and not attached to the wound base. There is large (67-100%) pink, pale granulation within the wound bed. There is no necrotic tissue within the wound bed. 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 There was a Three Layer Compression Therapy Procedure by Zandra Abts, RN. Post procedure Diagnosis Wound #5: Same as Pre-Procedure Plan Follow-up Appointments: Return Appointment in 2 weeks. - Monday 11/30 Nurse Visit: - 1 week for rewrap Dressing Change Frequency: Wound #5 Right,Medial Lower Leg: Do not change entire dressing for one week. Skin Barriers/Peri-Wound Care: Wound #5 Right,Medial Lower Leg: Moisturizing lotion Wound Cleansing: Wound #5  Right,Medial Lower Leg: May shower with protection. Primary Wound Dressing: Wound #5 Right,Medial Lower Leg: Endoform - moisten with saline Secondary Dressing: Wound #5 Right,Medial Lower Leg: Dry Gauze Edema Control: 3 Layer Compression System - Right Lower Extremity Avoid standing for long periods of time Elevate legs to the level of the heart or above for 30 minutes daily and/or when sitting, a frequency of: - throughout the day Exercise regularly Support Garment 20-30 mm/Hg pressure  to: - Juxtalite to left leg 1. Continue with endoform dry gauze and 3 layer compression 2. We are making slow but steady progress with this. At this point I do not see the need to commit to an expensive dressing like [employer] Electronic Signature(s) Signed: 03/22/2019 5:46:17 PM By: Baltazar Najjarobson, Aydan Phoenix MD Entered By: Baltazar Najjarobson, Caelan Atchley on 03/22/2019 10:08:18 -------------------------------------------------------------------------------- SuperBill Details Patient Name: Date of Service: [name], Tami D. 03/22/2019 Medical Record [id number] Patient Account Number: [id number] Date of Birth/Sex: Treating RN: 07/20/1930 (85 y.o. [name]) [name], Shatara Primary Care Provider: Alva GarnetShelton, Kimberly R Other Clinician: Referring Provider: Treating Provider/Extender:Nga Rabon, Gaylene BrooksMichael Shelton, Ivonne AndrewKimberly R Weeks in Treatment: 15 Diagnosis Coding ICD-10 Codes Code Description [phone] Non-pressure chronic ulcer of right ankle with fat layer exposed I89.0 Lymphedema, not elsewhere classified I87.2 Venous insufficiency (chronic) (peripheral) I70.235 Atherosclerosis of native arteries of right leg with ulceration of other part of foot Facility Procedures CPT4 Code Description: [id number] (Facility Use Only) [id number] - APPLY MULTLAY COMPRS LWR RT LEG Modifier: Quantity: 1 Physician Procedures CPT4 Code Description: [id number] - WC PHYS LEVEL 3 - EST PT ICD-10 Diagnosis Description L97.312 Non-pressure chronic ulcer of right  ankle with fat layer I89.0 Lymphedema, not elsewhere classified I87.2 Venous insufficiency (chronic) (peripheral) Modifier: exposed Quantity: 1 Electronic Signature(s) Signed: 03/22/2019 5:46:17 PM By: Baltazar Najjarobson, Milton Sagona MD Entered By: Baltazar Najjarobson, Damisha Wolff on 03/22/2019 10:08:39

## 2019-03-23 NOTE — Progress Notes (Signed)
[name], [name] ([id number]) Visit Report for 03/22/2019 Arrival Information Details Patient Name: Date of Service: [name], [name] 03/22/2019 9:00 AM Medical Record [id number] Patient Account Number: [id number] Date of Birth/Sex: Treating RN: [birth year] (85 y.o. [name], Meta.Reding Primary Care Peggie Hornak: Salena Saner Other Clinician: Referring Secily Walthour: Treating Darryl Blumenstein/Extender:Robson, Rhona Leavens, Doristine Locks in Treatment: 15 Visit Information History Since Last Visit Added or deleted any medications: Yes Patient Arrived: Cane Any new allergies or adverse reactions: No Arrival Time: 09:30 daughter Had a fall or experienced change in No Accompanied By: activities of daily living that may affect Transfer Assistance: None risk of falls: Patient Identification Verified: Yes Signs or symptoms of abuse/neglect since last No Secondary Verification Process Completed: Yes visito Patient Requires Transmission-Based No Hospitalized since last visit: No Precautions: Implantable device outside of the clinic excluding No Patient Has Alerts: No cellular tissue based products placed in the center since last visit: Has Dressing in Place as Prescribed: Yes Has Compression in Place as Prescribed: Yes Pain Present Now: No Electronic Signature(s) Signed: 03/22/2019 5:37:24 PM By: Deon Pilling Entered By: Deon Pilling on 03/22/2019 09:38:26 -------------------------------------------------------------------------------- Compression Therapy Details Patient Name: Date of Service: Tami Slade D. 03/22/2019 9:00 AM Medical Record [id number] Patient Account Number: [id number] Date of Birth/Sex: Treating RN: 1931/02/11 (85 y.o. [name] Primary Care Charlee Whitebread: Salena Saner Other Clinician: Referring Destine Zirkle: Treating Lorree Millar/Extender:Robson, Rhona Leavens, Doristine Locks in Treatment: 15 Compression Therapy Performed for Wound Wound #5  Right,Medial Lower Leg Assessment: Performed By: Clinician Levan Hurst, RN Compression Type: Three Layer Post Procedure Diagnosis Same as Pre-procedure Electronic Signature(s) Signed: 03/22/2019 5:59:11 PM By: Levan Hurst RN, BSN Entered By: Levan Hurst on 03/22/2019 09:59:03 -------------------------------------------------------------------------------- Encounter Discharge Information Details Patient Name: Date of Service: Tami Slade D. 03/22/2019 9:00 AM Medical Record [id number] Patient Account Number: [id number] Date of Birth/Sex: Treating RN: 1930/08/31 (85 y.o. [name], [name] Primary Care Kamori Barbier: Salena Saner Other Clinician: Referring Javonda Suh: Treating Kasidi Shanker/Extender:Robson, Rhona Leavens, Doristine Locks in Treatment: 15 Encounter Discharge Information Items Discharge Condition: Stable Ambulatory Status: Cane Discharge Destination: Home Transportation: Private Auto Accompanied By: daughter Schedule Follow-up Appointment: Yes Clinical Summary of Care: Electronic Signature(s) Signed: 03/22/2019 5:37:24 PM By: Deon Pilling Entered By: Deon Pilling on 03/22/2019 10:15:42 -------------------------------------------------------------------------------- Lower Extremity Assessment Details Patient Name: Date of Service: [name], [name] 03/22/2019 9:00 AM Medical Record [id number] Patient Account Number: [id number] Date of Birth/Sex: Treating RN: [birth year] (85 y.o. [name], [name] Primary Care Kimberlyann Hollar: Salena Saner Other Clinician: Referring Merl Guardino: Treating Sreekar Broyhill/Extender:Robson, Rhona Leavens, Doristine Locks in Treatment: 15 Edema Assessment Assessed: [Left: No] [Right: Yes] Edema: [Left: Ye] [Right: s] Calf Left: Right: Point of Measurement: 35 cm From Medial Instep cm 33 cm Ankle Left: Right: Point of Measurement: 10 cm From Medial Instep cm 23 cm Vascular Assessment Pulses: Dorsalis Pedis Palpable:  [Right:Yes] Electronic Signature(s) Signed: 03/22/2019 5:37:24 PM By: Deon Pilling Entered By: Deon Pilling on 03/22/2019 09:33:12 -------------------------------------------------------------------------------- Multi Wound Chart Details Patient Name: Date of Service: Tami Slade D. 03/22/2019 9:00 AM Medical Record [id number] Patient Account Number: [id number] Date of Birth/Sex: Treating RN: [birth year] (85 y.o. [name] Primary Care Maricruz Lucero: Salena Saner Other Clinician: Referring Hilda Wexler: Treating Jocilyn Trego/Extender:Robson, Rhona Leavens, Doristine Locks in Treatment: 15 Vital Signs Height(in): 62 Pulse(bpm): 42 Weight(lbs): 158 Blood Pressure(mmHg): 125/50 Body Mass Index(BMI): 29 Temperature(F): 97.9 Respiratory 18 Rate(breaths/min): Photos: [5:No Photos] [N/A:N/A] Wound Location: [5:Right Lower Leg - Medial] [N/A:N/A] Wounding Event: [5:Gradually Appeared] [N/A:N/A] Primary  Etiology: [5:Vasculopathy] [N/A:N/A] Comorbid History: [5:Deep Vein Thrombosis, Hypertension] [N/A:N/A] Date Acquired: [5:11/04/2018] [N/A:N/A] Weeks of Treatment: [5:15] [N/A:N/A] Wound Status: [5:Open] [N/A:N/A] Measurements L x W x D 0.6x0.6x0.1 [N/A:N/A] (cm) Area (cm) : [5:0.283] [N/A:N/A] Volume (cm) : [5:0.028] [N/A:N/A] % Reduction in Area: [5:92.90%] [N/A:N/A] % Reduction in Volume: 96.50% [N/A:N/A] Classification: [5:Full Thickness Without Exposed Support Structures] [N/A:N/A] Exudate Amount: [5:Small] [N/A:N/A] Exudate Type: [5:Serosanguineous] [N/A:N/A] Exudate Color: [5:red, brown] [N/A:N/A] Wound Margin: [5:Well defined, not attached N/A] Granulation Amount: [5:Large (67-100%)] [N/A:N/A] Granulation Quality: [5:Pink, Pale] [N/A:N/A] Necrotic Amount: [5:None Present (0%)] [N/A:N/A] Exposed Structures: [5:Fat Layer (Subcutaneous N/A Tissue) Exposed: Yes Fascia: No Tendon: No Muscle: No Joint: No Bone: No] Epithelialization: [5:Medium (34-66%)  Compression Therapy] [N/A:N/A N/A] Treatment Notes Electronic Signature(s) Signed: 03/22/2019 5:46:17 PM By: Linton Ham MD Signed: 03/22/2019 5:59:11 PM By: Levan Hurst RN, BSN Entered By: Linton Ham on 03/22/2019 10:05:53 -------------------------------------------------------------------------------- Multi-Disciplinary Care Plan Details Patient Name: Date of Service: Tami Slade D. 03/22/2019 9:00 AM Medical Record [name] Patient Account Number: [id number] Date of Birth/Sex: Treating RN: 12-Oct-1930 (85 y.o. [name] Primary Care Waynesha Rammel: Salena Saner Other Clinician: Referring Demecia Northway: Treating Gurman Ashland/Extender:Robson, Rhona Leavens, Doristine Locks in Treatment: 15 Active Inactive Wound/Skin Impairment Nursing Diagnoses: Impaired tissue integrity Knowledge deficit related to ulceration/compromised skin integrity Goals: Patient/caregiver will verbalize understanding of skin care regimen Date Initiated: 12/07/2018 Target Resolution Date: 04/02/2019 Goal Status: Active Ulcer/skin breakdown will have a volume reduction of 30% by week 4 Date Initiated: 12/07/2018 Date Inactivated: 01/18/2019 Target Resolution Date: 01/08/2019 Goal Status: Met Interventions: Assess patient/caregiver ability to obtain necessary supplies Assess patient/caregiver ability to perform ulcer/skin care regimen upon admission and as needed Assess ulceration(s) every visit Provide education on ulcer and skin care Notes: Electronic Signature(s) Signed: 03/22/2019 5:59:11 PM By: Levan Hurst RN, BSN Entered By: Levan Hurst on 03/22/2019 09:54:15 -------------------------------------------------------------------------------- Pain Assessment Details Patient Name: Date of Service: Tami Slade D. 03/22/2019 9:00 AM Medical Record [id number] Patient Account Number: [id number] Date of Birth/Sex: Treating RN: [birth year] (85 y.o. [name] Primary Care  Ilia Dimaano: Salena Saner Other Clinician: Referring Tarick Parenteau: Treating Buel Molder/Extender:Robson, Rhona Leavens, Doristine Locks in Treatment: 15 Active Problems Location of Pain Severity and Description of Pain Patient Has Paino No Site Locations Rate the pain. Current Pain Level: 0 Pain Management and Medication Current Pain Management: Medication: No Cold Application: No Rest: No Massage: No Activity: No T.E.N.S.: No Heat Application: No Leg drop or elevation: No Is the Current Pain Management Adequate: Adequate How does your wound impact your activities of daily livingo Sleep: No Bathing: No Appetite: No Relationship With Others: No Bladder Continence: No Emotions: No Bowel Continence: No Work: No Toileting: No Drive: No Dressing: No Hobbies: No Electronic Signature(s) Signed: 03/22/2019 5:37:24 PM By: Deon Pilling Entered By: Deon Pilling on 03/22/2019 09:38:19 -------------------------------------------------------------------------------- Patient/Caregiver Education Details Patient Name: Date of Service: [name] 11/16/2020andnbsp9:00 AM Medical Record [id number] Patient Account Number: [id number] Date of Birth/Gender: Treating RN: February 17, 1931 (85 y.o. [name] Primary Care Physician: Salena Saner Other Clinician: Referring Physician: Treating Physician/Extender:Robson, Rhona Leavens, Doristine Locks in Treatment: 15 Education Assessment Education Provided To: Patient Education Topics Provided Wound/Skin Impairment: Methods: Explain/Verbal Responses: State content correctly [employer]) Signed: 03/22/2019 5:59:11 PM By: Levan Hurst RN, BSN Entered By: Levan Hurst on 03/22/2019 09:54:27 -------------------------------------------------------------------------------- Wound Assessment Details Patient Name: Date of Service: Tami Slade D. 03/22/2019 9:00 AM Medical Record [id number]  Patient Account Number: [id number] Date of Birth/Sex: Treating RN: [birth year] (  85 y.o. [name], Meta.Reding Primary Care Chanceler Pullin: Salena Saner Other Clinician: Referring Apostolos Blagg: Treating Rabiah Goeser/Extender:Robson, Rhona Leavens, Doristine Locks in Treatment: 15 Wound Status Wound Number: 5 Primary Etiology: Vasculopathy Wound Location: Right Lower Leg - Medial Wound Status: Open Wounding Event: Gradually Appeared Comorbid History: Deep Vein Thrombosis, Hypertension Date Acquired: 11/04/2018 Weeks Of Treatment: 15 Clustered Wound: No Photos Wound Measurements Length: (cm) 0.6 % Reduct Width: (cm) 0.6 % Reduct Depth: (cm) 0.1 Epitheli Area: (cm) 0.283 Tunneli Volume: (cm) 0.028 Undermi Wound Description Classification: Full Thickness Without Exposed Support Foul Odo Structures Slough/F Wound Well defined, not attached Margin: Exudate Small Amount: Exudate Serosanguineous Type: Exudate red, brown Color: Wound Bed Granulation Amount: Large (67-100%) Granulation Quality: Pink, Pale Fascia E Necrotic Amount: None Present (0%) Fat Laye Tendon E Muscle E Joint Ex Bone Exp r After Cleansing: No ibrino No Exposed Structure xposed: No r (Subcutaneous Tissue) Exposed: Yes xposed: No xposed: No posed: No osed: No ion in Area: 92.9% ion in Volume: 96.5% alization: Medium (34-66%) ng: No ning: No Treatment Notes Wound #5 (Right, Medial Lower Leg) 1. Cleanse With Wound Cleanser Soap and water 2. Periwound Care Moisturizing lotion 3. Primary Dressing Applied Endoform Hydrogel or K-Y Jelly 4. Secondary Dressing Dry Gauze 5. Secured With Medipore tape 6. Support Layer Applied 3 layer compression wrap Notes stretch net Electronic Signature(s) Signed: 03/22/2019 5:37:24 PM By: Deon Pilling Signed: 03/23/2019 4:00:22 PM By: Mikeal Hawthorne EMT/HBOT Entered By: Mikeal Hawthorne on 03/22/2019  14:07:12 -------------------------------------------------------------------------------- Vitals Details Patient Name: Date of Service: Tami Slade D. 03/22/2019 9:00 AM Medical Record [id number] Patient Account Number: [id number] Date of Birth/Sex: Treating RN: 1930-07-06 (85 y.o. [name], Meta.Reding Primary Care Macio Kissoon: Salena Saner Other Clinician: Referring Gabriele Zwilling: Treating Aditri Louischarles/Extender:Robson, Rhona Leavens, Doristine Locks in Treatment: 15 Vital Signs Time Taken: 09:30 Temperature (F): 97.9 Height (in): 62 Pulse (bpm): 42 Weight (lbs): 158 Respiratory Rate (breaths/min): 18 Body Mass Index (BMI): 28.9 Blood Pressure (mmHg): 125/50 Reference Range: 80 - 120 mg / dl Electronic Signature(s) Signed: 03/22/2019 5:37:24 PM By: Deon Pilling Entered By: Deon Pilling on 03/22/2019 09:38:11

## 2019-03-29 ENCOUNTER — Encounter (HOSPITAL_BASED_OUTPATIENT_CLINIC_OR_DEPARTMENT_OTHER): Payer: Medicare Other

## 2019-03-29 ENCOUNTER — Other Ambulatory Visit: Payer: Self-pay

## 2019-03-29 DIAGNOSIS — I890 Lymphedema, not elsewhere classified: Secondary | ICD-10-CM | POA: Diagnosis not present

## 2019-03-29 NOTE — Progress Notes (Signed)
[name] ([id number]) Visit Report for 03/29/2019 Arrival Information Details Patient Name: Date of Service: [name], [name] 03/29/2019 12:30 PM Medical Record [id number] Patient Account Number: [id number] Date of Birth/Sex: Treating RN: 09-11-1930 (85 y.o. [name] Primary Care Aolanis Crispen: Salena Saner Other Clinician: Referring Legend Tumminello: Treating Anacristina Steffek/Extender:Stone III, Bobbye [place], Doristine Locks in Treatment: 16 Visit Information History Since Last Visit Added or deleted any medications: No Patient Arrived: Ambulatory Any new allergies or adverse reactions: No Arrival Time: 13:01 Had a fall or experienced change in No Accompanied By: daughter activities of daily living that may affect Transfer Assistance: None risk of falls: Patient Identification Verified: Yes Signs or symptoms of abuse/neglect since last No Secondary Verification Process Yes visito Completed: Hospitalized since last visit: No Patient Requires Transmission-Based No Implantable device outside of the clinic excluding No Precautions: cellular tissue based products placed in the center Patient Has Alerts: No since last visit: Has Dressing in Place as Prescribed: Yes Has Compression in Place as Prescribed: Yes Pain Present Now: No Electronic Signature(s) Signed: 03/29/2019 2:18:43 PM By: Levan Hurst RN, BSN Entered By: Levan Hurst on 03/29/2019 13:04:51 -------------------------------------------------------------------------------- Compression Therapy Details Patient Name: Date of Service: Tami Slade D. 03/29/2019 12:30 PM Medical Record [id number] Patient Account Number: [id number] Date of Birth/Sex: Treating RN: 1930-08-15 (85 y.o. [name] Primary Care Avanni Turnbaugh: Salena Saner Other Clinician: Referring Etha Stambaugh: Treating Sully Manzi/Extender:Stone III, Bobbye [place], Doristine Locks in Treatment: 16 Compression Therapy Performed for  Wound Wound #5 Right,Medial Lower Leg Assessment: Performed By: Clinician Levan Hurst, RN Compression Type: Three Hydrologist) Signed: 03/29/2019 2:18:43 PM By: Levan Hurst RN, BSN Entered By: Levan Hurst on 03/29/2019 13:07:44 -------------------------------------------------------------------------------- Encounter Discharge Information Details Patient Name: Date of Service: Tami Slade D. 03/29/2019 12:30 PM Medical Record [id number] Patient Account Number: [id number] Date of Birth/Sex: Treating RN: 1930-09-28 (85 y.o. [name] Primary Care Moorea Boissonneault: Salena Saner Other Clinician: Referring Johnmatthew Solorio: Treating Stiles Maxcy/Extender:Stone III, Bobbye [place], Doristine Locks in Treatment: 16 Encounter Discharge Information Items Discharge Condition: Stable Ambulatory Status: Wheelchair Discharge Destination: Home Transportation: Private Auto Accompanied By: daughter Schedule Follow-up Appointment: Yes Clinical Summary of Care: Patient Declined Electronic Signature(s) Signed: 03/29/2019 2:18:43 PM By: Levan Hurst RN, BSN Entered By: Levan Hurst on 03/29/2019 13:10:00 -------------------------------------------------------------------------------- Patient/Caregiver Education Details Patient Name: Date of Service: [name] 11/23/2020andnbsp12:30 PM Medical Record Patient Account Number: [id number] Number: Treating RN: Levan Hurst Date of Birth/Gender: Sep 15, 1930 (85 y.o. F) Other Clinician: Primary Care Physician: Salena Saner Treating Worthy Keeler Referring Physician: Physician/Extender: Teodoro Kil in Treatment: 16 Education Assessment Education Provided To: Patient Education Topics Provided Wound/Skin Impairment: Methods: Explain/Verbal Responses: State content correctly Electronic Signature(s) Signed: 03/29/2019 2:18:43 PM By: Levan Hurst RN, BSN Entered By: Levan Hurst on 03/29/2019 13:09:34 -------------------------------------------------------------------------------- Wound Assessment Details Patient Name: Date of Service: [name] 03/29/2019 12:30 PM Medical Record [id number] Patient Account Number: [id number] Date of Birth/Sex: Treating RN: Jan 03, 1931 (85 y.o. [name] Primary Care Laquentin Loudermilk: Salena Saner Other Clinician: Referring Gordon Vandunk: Treating Rie Mcneil/Extender:Stone III, Bobbye [place], Doristine Locks in Treatment: 16 Wound Status Wound Number: 5 Primary Etiology: Vasculopathy Wound Location: Right Lower Leg - Medial Wound Status: Open Wounding Event: Gradually Appeared Comorbid History: Deep Vein Thrombosis, Hypertension Date Acquired: 11/04/2018 Weeks Of Treatment: 16 Clustered Wound: No Wound Measurements Length: (cm) 0.6 % Reduct Width: (cm) 0.6 % Reduct Depth: (cm) 0.1 Epitheli Area: (cm) 0.283 Tunneli Volume: (cm) 0.028 Undermi  Wound Description Classification: Full Thickness Without Exposed Support Structures Wound Distinct, outline attached Margin: Exudate Small Amount: Exudate Serosanguineous Type: Exudate red, brown Color: Wound Bed Granulation Amount: Large (67-100%) Granulation Quality: Pink, Pale Necrotic Amount: None Present (0%) Treatment Notes Wound #5 (Right, Medial Lower Leg) 1. Cleanse With Wound Cleanser Soap and water 2. Periwound Care Moisturizing lotion 3. Primary Dressing Applied Endoform 4. Secondary Dressing Dry Gauze 6. Support Layer Applied 3 layer compression wrap Foul Odor After Cleansing: No Slough/Fibrino No Exposed Structure Fascia Exposed: No Fat Layer (Subcutaneous Tissue) Exposed: Yes Tendon Exposed: No Muscle Exposed: No Joint Exposed: No Bone Exposed: No ion in Area: 92.9% ion in Volume: 96.5% alization: Medium (34-66%) ng: No ning: No Notes stretch net Electronic Signature(s) Signed: 03/29/2019 2:18:43 PM By: Zandra Abts RN, BSN Entered By: Zandra Abts on 03/29/2019 13:06:04 -------------------------------------------------------------------------------- Vitals Details Patient Name: Date of Service: Tami Comber D. 03/29/2019 12:30 PM Medical Record [id number] Patient Account Number: [id number] Date of Birth/Sex: Treating RN: [birth year] (85 y.o. Wynelle Link Primary Care Amoura Ransier: Alva Garnet Other Clinician: Referring Donnamae Muilenburg: Treating Dilana Mcphie/Extender:Stone III, Cristina Gong, Ivonne Andrew in Treatment: 16 Vital Signs Time Taken: 13:02 Temperature (F): 98.3 Height (in): 62 Pulse (bpm): 46 Weight (lbs): 158 Respiratory Rate (breaths/min): 18 Body Mass Index (BMI): 28.9 Blood Pressure (mmHg): 120/37 Reference Range: 80 - 120 mg / dl Electronic Signature(s) Signed: 03/29/2019 2:18:43 PM By: Zandra Abts RN, BSN Entered By: Zandra Abts on 03/29/2019 13:04:57

## 2019-04-05 ENCOUNTER — Encounter (HOSPITAL_BASED_OUTPATIENT_CLINIC_OR_DEPARTMENT_OTHER): Payer: Medicare Other | Admitting: Internal Medicine

## 2019-04-05 ENCOUNTER — Other Ambulatory Visit: Payer: Self-pay

## 2019-04-05 DIAGNOSIS — I890 Lymphedema, not elsewhere classified: Secondary | ICD-10-CM | POA: Diagnosis not present

## 2019-04-05 NOTE — Progress Notes (Signed)
[name], [name] ([id number]) Visit Report for 04/05/2019 Debridement Details Patient Name: Date of Service: [name], [name] 04/05/2019 9:15 AM Medical Record [id number] Patient Account Number: [id number] Date of Birth/Sex: Treating RN: [birth year] (85 y.o. F) Primary Care Provider: Alva Garnet Other Clinician: Referring Provider: Treating Provider/Extender:Hampton Cost, Gaylene Call, Ivonne Andrew in Treatment: 17 Debridement Performed for Wound #5 Right,Medial Lower Leg Assessment: Performed By: Physician Maxwell Caul., MD Debridement Type: Debridement Level of Consciousness (Pre- Awake and Alert procedure): Pre-procedure Yes - 10:12 Verification/Time Out Taken: Start Time: 10:12 Pain Control: Lidocaine 4% Topical Solution Total Area Debrided (L x W): 0.6 (cm) x 0.6 (cm) = 0.36 (cm) Tissue and other material Viable, Non-Viable, Slough, Subcutaneous, Slough debrided: Level: Skin/Subcutaneous Tissue Debridement Description: Excisional Instrument: Curette Bleeding: Minimum Hemostasis Achieved: Pressure End Time: 10:13 Procedural Pain: 0 Post Procedural Pain: 0 Response to Treatment: Procedure was tolerated well Level of Consciousness Awake and Alert (Post-procedure): Post Debridement Measurements of Total Wound Length: (cm) 0.6 Width: (cm) 0.6 Depth: (cm) 0.2 Volume: (cm) 0.057 Character of Wound/Ulcer Post Improved Debridement: Post Procedure Diagnosis Same as Pre-procedure Electronic Signature(s) Signed: 04/05/2019 6:46:53 PM By: Baltazar Najjar MD Entered By: Baltazar Najjar on 04/05/2019 10:59:14 -------------------------------------------------------------------------------- HPI Details Patient Name: Date of Service: Tami Comber D. 04/05/2019 9:15 AM Medical Record [id number] Patient Account Number: [id number] Date of Birth/Sex: Treating RN: 03/30/1931 (85 y.o. 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 Patient was given a course of doxycycline for 7 days that she completed, has been putting mupirocin cream on the wounds, leaving it exposed at night and covering with dry gauze during the day. Overall patient who is present with her daughter in the clinic today states that the 2 wounds have been improving to the present size. There is also some seepage in and around the wound area. Both legs are swollen. No changes to dimensions of legs overall in the recent past. Patient was remotely seen here in the clinic in 2014 for right lower leg tibial wounds very similar to the ones were seeing today. 8/10-Patient returns after being initiated in the wound clinic last week. The right medial leg wounds about the right ankle are both slightly smaller in size, not much seepage, legs are not as swollen we are using a 3 layer compression of the right juxta lite on the left she denies any significant pain 8/17; the patient has 2 small open areas on the medial right leg. She has good edema control. There is no doubt she has underlying lymphedema. Still a lot of adherent debris to both wounds 8/24; same 2 small open areas on the medial right leg. Edema control. We have been using Iodoflex since last week and the surface of the wound looks some better. 9/14; 2 small openings on the right medial lower leg. The patient went for her venous reflux studies in [facility]. 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 The findings  were similar to when examined 2016. There was normal appearance of the great saphenous veins and short saphenous veins bilaterally no evidence for superficial venous reflux and no varicosities. No  indication for venous ablation We have been using Iodoflex. Changed to Sorbact today 9/21; 2 small openings in close juxtaposition on the right medial lower leg. She had difficulties this week with her 3 layer compression becoming too tight she took this off late last Wednesday/Thursday morning. She did not put anything else on her leg quite a bit of nonpitting/lymphedema today still using Sorbact 9/28; 2 small wounds in close juxtaposition in the right medial lower leg. We have been using Sorbact. Changed to endoform today 10/5; 2 small wounds in close juxtaposition the right medial lower leg I changed to endoform last week unfortunately not much improvement 10/12; 2 small wounds in close juxtaposition on the right lower leg in the setting of severe chronic venous insufficiency secondary to prior DVTs. 10/19; 2 small wounds in close juxtaposition in the right lower leg in the setting of severe chronic venous insufficiency secondary to prior DVTs. The more anterior one is closed however the more posterior one still has not changed that much. Considerable depth. We have been using endoform 10/26; not much change in the 1 remaining wound. Still deep and punched out looking. Adherent surface slough. We have been using endoform. We have been trying to run Grafix PL but have not heard a response yet 11/2; wound is measuring slightly smaller. Depth looks like it is come in better. Better looking surface although still requiring a light debridement. Grafix PL not covered by [facility], we will run Oasis 11/9; not much change in dimensions although the wound cleans up quite well. Still have not heard about Oasis using endoform 11/16; the patient has a $200 co-pay for Oasis. She is using endoform and  making slow but steady improvements. The wound is cleaning up quite nicely 11/30; using endoform under compression. One wound remaining. Still debris on the surface. Unacceptable co-pay for advanced treatment products Electronic Signature(s) Signed: 04/05/2019 6:46:53 PM By: Baltazar Najjarobson, Brandi Tomlinson MD Entered By: Baltazar Najjarobson, Eyana Stolze on 04/05/2019 11:00:03 -------------------------------------------------------------------------------- Physical Exam Details Patient Name: Date of Service: Tami SalinesBROOKS, Ori D. 04/05/2019 9:15 AM Medical Record [id number] Patient Account Number: [id number] Date of Birth/Sex: Treating RN: 11/22/1930 (85 y.o. F) Primary Care Provider: Alva GarnetShelton, Kimberly R Other Clinician: Referring Provider: Treating Provider/Extender:Abdulhadi Stopa, Gaylene BrooksMichael Shelton, Ivonne AndrewKimberly R Weeks in Treatment: 17 Constitutional Sitting or standing Blood Pressure is within target range for patient.. Pulse regular and within target range for patient.[name]. Respirations regular, non-labored and within target range.. Temperature is normal and within the target range for the patient.[name]. Appears in no distress. Notes Wound exam; right medial lower leg. Small punched out area. Adherent debris over the surface. Using a #3 curette this was removed. Most of the wound bed here looks reasonably healthy. Still some depth. No evidence of surrounding infection. Electronic Signature(s) Signed: 04/05/2019 6:46:53 PM By: Baltazar Najjarobson, Niomie Englert MD Entered By: Baltazar Najjarobson, Taelor Waymire on 04/05/2019 11:00:43 -------------------------------------------------------------------------------- Physician Orders Details Patient Name: Date of Service: Tami SalinesBROOKS, Surabhi D. 04/05/2019 9:15 AM Medical Record [id number] Patient Account Number: [id number] Date of Birth/Sex: Treating RN: 02/16/1931 (85 y.o. [name]) [name], Shatara Primary Care Provider: Alva GarnetShelton, Kimberly R Other Clinician: Referring Provider: Treating Provider/Extender:Vondra Aldredge, Gaylene BrooksMichael Shelton,  Ivonne AndrewKimberly R Weeks in Treatment: 17 Verbal / Phone Orders: No Diagnosis Coding ICD-10 Coding Code Description L97.312 Non-pressure chronic ulcer of right ankle with fat layer exposed I89.0 Lymphedema, not elsewhere classified I87.2 Venous insufficiency (chronic) (peripheral) I70.235 Atherosclerosis of native arteries of right leg with ulceration of other part of foot Follow-up Appointments Return Appointment in 1 week. Dressing Change Frequency Wound #5  Right,Medial Lower Leg Do not change entire dressing for one week. Skin Barriers/Peri-Wound Care Wound #5 Right,Medial Lower Leg Moisturizing lotion Wound Cleansing Wound #5 Right,Medial Lower Leg May shower with protection. Primary Wound Dressing Wound #5 Right,Medial Lower Leg Endoform - moisten with saline Secondary Dressing Wound #5 Right,Medial Lower Leg Dry Gauze Edema Control 3 Layer Compression System - Right Lower Extremity Avoid standing for long periods of time Elevate legs to the level of the heart or above for 30 minutes daily and/or when sitting, a frequency of: - throughout the day Exercise regularly Support Garment 20-30 mm/Hg pressure to: - Juxtalite to left leg Electronic Signature(s) Signed: 04/05/2019 6:27:46 PM By: Zandra AbtsLynch, Shatara RN, BSN Signed: 04/05/2019 6:46:53 PM By: Baltazar Najjarobson, Jonh Mcqueary MD Entered By: Zandra AbtsLynch, Shatara on 04/05/2019 09:57:28 -------------------------------------------------------------------------------- Problem List Details Patient Name: Date of Service: Tami SalinesBROOKS, Ysabela D. 04/05/2019 9:15 AM Medical Record [id number] Patient Account Number: [id number] Date of Birth/Sex: Treating RN: 10/08/1930 (85 y.o. [name]) [name], Shatara Primary Care Provider: Alva GarnetShelton, Kimberly R Other Clinician: Referring Provider: Treating Provider/Extender:Kyri Shader, Gaylene BrooksMichael Shelton, Ivonne AndrewKimberly R Weeks in Treatment: 17 Active Problems ICD-10 Evaluated Encounter Evaluated Encounter Code Description Active  Date Today Diagnosis L97.312 Non-pressure chronic ulcer of right ankle with fat layer 12/07/2018 No Yes exposed I89.0 Lymphedema, not elsewhere classified 12/21/2018 No Yes I87.2 Venous insufficiency (chronic) (peripheral) 12/07/2018 No Yes I70.235 Atherosclerosis of native arteries of right leg with 02/01/2019 No Yes ulceration of other part of foot Inactive Problems Resolved Problems Electronic Signature(s) Signed: 04/05/2019 6:46:53 PM By: Baltazar Najjarobson, Eustacio Ellen MD Entered By: Baltazar Najjarobson, Cordie Beazley on 04/05/2019 10:58:58 -------------------------------------------------------------------------------- Progress Note Details Patient Name: Date of Service: Tami ComberBROOKS, Sawsan D. 04/05/2019 9:15 AM Medical Record [id number] Patient Account Number: [id number] Date of Birth/Sex: Treating RN: 05/17/1930 (85 y.o. F) Primary Care Provider: Alva GarnetShelton, Kimberly R Other Clinician: Referring Provider: Treating Provider/Extender:Breven Guidroz, Gaylene BrooksMichael Shelton, Ivonne AndrewKimberly R Weeks in Treatment: 17 Subjective History of Present Illness (HPI) Pleasant 85 year old African-American female with history of bilateral DVTs with presumed history of hypercoagulable state, with venous insufficiency as a result, presenting with 3-week onset of right medial leg wounds just above the right ankle of 3 weeks duration. Patient was given a course of doxycycline for 7 days that she completed, has been putting mupirocin cream on the wounds, leaving it exposed at night and covering with dry gauze during the day. Overall patient who is present with her daughter in the clinic today states that the 2 wounds have been improving to the present size. There is also some seepage in and around the wound area. Both legs are swollen. No changes to dimensions of legs overall in the recent past. Patient was remotely seen here in the clinic in 2014 for right lower leg tibial wounds very similar to the ones were seeing today. 8/10-Patient returns after being  initiated in the wound clinic last week. The right medial leg wounds about the right ankle are both slightly smaller in size, not much seepage, legs are not as swollen we are using a 3 layer compression of the right juxta lite on the left she denies any significant pain 8/17; the patient has 2 small open areas on the medial right leg. She has good edema control. There is no doubt she has underlying lymphedema. Still a lot of adherent debris to both wounds 8/24; same 2 small open areas on the medial right leg. Edema control. We have been using Iodoflex since last week and the surface of the wound looks some better. 9/14; 2 small openings on the  right medial lower leg. The patient went for her venous reflux studies in [place] [facility]. 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 The findings were similar to when examined 2016. There was normal appearance of the great saphenous veins and short saphenous veins bilaterally no evidence for superficial venous reflux and no varicosities. No indication for venous ablation We have been using Iodoflex. Changed to Sorbact today 9/21; 2 small openings in close juxtaposition on the right medial lower leg. She had difficulties this week with her 3 layer compression becoming too tight she took this off late last Wednesday/Thursday morning. She did not put anything else on her leg quite a bit of nonpitting/lymphedema today still using Sorbact 9/28; 2 small wounds in close juxtaposition in the right medial lower leg. We have been using Sorbact. Changed to endoform today 10/5; 2 small wounds in close juxtaposition the right medial lower leg I changed to endoform last week unfortunately not much improvement 10/12; 2 small wounds in close juxtaposition on the right lower leg in the setting of severe chronic venous insufficiency secondary to prior DVTs. 10/19; 2 small wounds in close juxtaposition in the  right lower leg in the setting of severe chronic venous insufficiency secondary to prior DVTs. The more anterior one is closed however the more posterior one still has not changed that much. Considerable depth. We have been using endoform 10/26; not much change in the 1 remaining wound. Still deep and punched out looking. Adherent surface slough. We have been using endoform. We have been trying to run Grafix PL but have not heard a response yet 11/2; wound is measuring slightly smaller. Depth looks like it is come in better. Better looking surface although still requiring a light debridement. Grafix PL not covered by [place] [facility], we will run Oasis 11/9; not much change in dimensions although the wound cleans up quite well. Still have not heard about Oasis using endoform 11/16; the patient has a $200 co-pay for Oasis. She is using endoform and making slow but steady improvements. The wound is cleaning up quite nicely 11/30; using endoform under compression. One wound remaining. Still debris on the surface. Unacceptable co-pay for advanced treatment products Objective Constitutional Sitting or standing Blood Pressure is within target range for patient.. Pulse regular and within target range for patient.[name] Respirations regular, non-labored and within target range.. Temperature is normal and within the target range for the patient.[name] Appears in no distress. Vitals Time Taken: 9:41 AM, Height: 62 in, Weight: 158 lbs, BMI: 28.9, Temperature: 97.8 F, Pulse: 42 bpm, Respiratory Rate: 18 breaths/min, Blood Pressure: 123/54 mmHg. General Notes: Wound exam; right medial lower leg. Small punched out area. Adherent debris over the surface. Using a #3 curette this was removed. Most of the wound bed here looks reasonably healthy. Still some depth. No evidence of surrounding infection. Integumentary (Hair, Skin) Wound #5 status is Open. Original cause of wound was Gradually Appeared. The wound is  located on the Right,Medial Lower Leg. The wound measures 0.6cm length x 0.6cm width x 0.2cm depth; 0.283cm^2 area and 0.057cm^3 volume. There is Fat Layer (Subcutaneous Tissue) Exposed exposed. There is no tunneling or undermining noted. There is a small amount of serosanguineous drainage noted. The wound margin is well defined and not attached to the wound base. There is medium (34-66%) pink, pale granulation within the wound bed. There is a medium (34-66%) amount of necrotic tissue within the wound bed including Adherent Slough. Assessment Active  Problems ICD-10 Non-pressure chronic ulcer of right ankle with fat layer exposed Lymphedema, not elsewhere classified Venous insufficiency (chronic) (peripheral) Atherosclerosis of native arteries of right leg with ulceration of other part of foot Procedures Wound #5 Pre-procedure diagnosis of Wound #5 is a Vasculopathy located on the Right,Medial Lower Leg . There was a Excisional Skin/Subcutaneous Tissue Debridement with a total area of 0.36 sq cm performed by Ricard Dillon., MD. With the following instrument(s): Curette to remove Viable and Non-Viable tissue/material. Material removed includes Subcutaneous Tissue and Slough and after achieving pain control using Lidocaine 4% Topical Solution. No specimens were taken. A time out was conducted at 10:12, prior to the start of the procedure. A Minimum amount of bleeding was controlled with Pressure. The procedure was tolerated well with a pain level of 0 throughout and a pain level of 0 following the procedure. Post Debridement Measurements: 0.6cm length x 0.6cm width x 0.2cm depth; 0.057cm^3 volume. Character of Wound/Ulcer Post Debridement is improved. Post procedure Diagnosis Wound #5: Same as Pre-Procedure Pre-procedure diagnosis of Wound #5 is a Vasculopathy located on the Right,Medial Lower Leg . There was a Three Layer Compression Therapy Procedure by Levan Hurst, RN. Post  procedure Diagnosis Wound #5: Same as Pre-Procedure Plan Follow-up Appointments: Return Appointment in 1 week. Dressing Change Frequency: Wound #5 Right,Medial Lower Leg: Do not change entire dressing for one week. Skin Barriers/Peri-Wound Care: Wound #5 Right,Medial Lower Leg: Moisturizing lotion Wound Cleansing: Wound #5 Right,Medial Lower Leg: May shower with protection. Primary Wound Dressing: Wound #5 Right,Medial Lower Leg: Endoform - moisten with saline Secondary Dressing: Wound #5 Right,Medial Lower Leg: Dry Gauze Edema Control: 3 Layer Compression System - Right Lower Extremity Avoid standing for long periods of time Elevate legs to the level of the heart or above for 30 minutes daily and/or when sitting, a frequency of: - throughout the day Exercise regularly Support Garment 20-30 mm/Hg pressure to: - Juxtalite to left leg 1. Continue with endoform under 3 layer compression. 2. I was really hoping that this would be further along as it is been 2 weeks since I saw this. 3. The other wound area was scabbed I remove this still not open although I am not completely certain why this it scabbed over Electronic Signature(s) Signed: 04/05/2019 6:46:53 PM By: Linton Ham MD Entered By: Linton Ham on 04/05/2019 11:02:29 -------------------------------------------------------------------------------- SuperBill Details Patient Name: Date of Service: ALACIA, REHMANN 04/05/2019 Medical Record [id number] Patient Account Number: [id number] Date of Birth/Sex: Treating RN: 04/08/1931 (85 y.o. F) Primary Care Provider: Salena Saner Other Clinician: Referring Provider: Treating Provider/Extender:Yuvonne Lanahan, Rhona Leavens, Doristine Locks in Treatment: 17 Diagnosis Coding ICD-10 Codes Code Description E70.350 Non-pressure chronic ulcer of right ankle with fat layer exposed I89.0 Lymphedema, not elsewhere classified I87.2 Venous insufficiency (chronic)  (peripheral) I70.235 Atherosclerosis of native arteries of right leg with ulceration of other part of foot Facility Procedures CPT4 Code Description: [id number] 11042 - DEB SUBQ TISSUE 20 SQ CM/< ICD-10 Diagnosis Description H37.169 Non-pressure chronic ulcer of right ankle with fat layer expo I89.0 Lymphedema, not elsewhere classified Modifier: sed Quantity: 1 Physician Procedures CPT4 Code Description: [id number] - WC PHYS SUBQ TISS 20 SQ CM ICD-10 Diagnosis Description W25.852 Non-pressure chronic ulcer of right ankle with fat layer expose I89.0 Lymphedema, not elsewhere classified Modifier: d Quantity: 1 Electronic Signature(s) Signed: 04/05/2019 6:46:53 PM By: Linton Ham MD Entered By: Linton Ham on 04/05/2019 11:02:45

## 2019-04-06 NOTE — Progress Notes (Signed)
JUSTUS, DUERR ([id number]) Visit Report for 04/05/2019 Arrival Information Details Patient Name: Date of Service: [name], [name] 04/05/2019 9:15 AM Medical Record [id number] Patient Account Number: [id number] Date of Birth/Sex: Treating RN: [birth year] (85 y.o. [name], [name] Primary Care Randy Castrejon: Salena Saner Other Clinician: Referring Jalilah Wiltsie: Treating Zebedee Segundo/Extender:Robson, Rhona Leavens, Doristine Locks in Treatment: 17 Visit Information History Since Last Visit Cane Added or deleted any medications: No Patient Arrived: 09:37 Any new allergies or adverse reactions: No Arrival Time: Had a fall or experienced change in No Accompanied By: self None activities of daily living that may affect Transfer Assistance: risk of falls: Patient Identification Verified: Yes Signs or symptoms of abuse/neglect since last No Secondary Verification Process Completed: Yes visito Patient Requires Transmission-Based No Hospitalized since last visit: No Precautions: Implantable device outside of the clinic excluding No Patient Has Alerts: No cellular tissue based products placed in the center since last visit: Has Dressing in Place as Prescribed: Yes Has Compression in Place as Prescribed: Yes Pain Present Now: No Electronic Signature(s) Signed: 04/06/2019 7:10:15 AM By: Kela Millin Entered By: Kela Millin on 04/05/2019 09:41:36 -------------------------------------------------------------------------------- Compression Therapy Details Patient Name: Date of Service: [name], [name] 04/05/2019 9:15 AM Medical Record [id number] Patient Account Number: [id number] Date of Birth/Sex: Treating RN: May 14, 1930 (85 y.o. [name] Primary Care Renny Gunnarson: Salena Saner Other Clinician: Referring Tasnia Spegal: Treating Bejamin Hackbart/Extender:Robson, Rhona Leavens, Doristine Locks in Treatment: 17 Compression Therapy Performed for Wound Wound #5  Right,Medial Lower Leg Assessment: Performed By: Clinician Levan Hurst, RN Compression Type: Three Layer Post Procedure Diagnosis Same as Pre-procedure Electronic Signature(s) Signed: 04/05/2019 6:27:46 PM By: Levan Hurst RN, BSN Entered By: Levan Hurst on 04/05/2019 10:14:19 -------------------------------------------------------------------------------- Encounter Discharge Information Details Patient Name: Date of Service: [name], [name] 04/05/2019 9:15 AM Medical Record [id number] Patient Account Number: [id number] Date of Birth/Sex: Treating RN: 03-08-1931 (85 y.o. [name] Primary Care Ovie Cornelio: Salena Saner Other Clinician: Referring Alik Mawson: Treating Shahmeer Bunn/Extender:Robson, Rhona Leavens, Doristine Locks in Treatment: 17 Encounter Discharge Information Items Post Procedure Vitals Discharge Condition: Stable Temperature (F): 97.8 Ambulatory Status: Cane Pulse (bpm): 42 Discharge Destination: Home Respiratory Rate (breaths/min): 18 Transportation: Private Auto Blood Pressure (mmHg): 123/54 Accompanied By: self Schedule Follow-up Appointment: Yes Clinical Summary of Care: Electronic Signature(s) Signed: 04/05/2019 6:19:57 PM By: Deon Pilling Entered By: Deon Pilling on 04/05/2019 10:31:48 -------------------------------------------------------------------------------- Lower Extremity Assessment Details Patient Name: Date of Service: [name], [name] 04/05/2019 9:15 AM Medical Record [id number] Patient Account Number: [id number] Date of Birth/Sex: Treating RN: 29-Mar-1931 (85 y.o. [name] Primary Care Kavir Savoca: Salena Saner Other Clinician: Referring Korbin Mapps: Treating Davontay Watlington/Extender:Robson, Rhona Leavens, Doristine Locks in Treatment: 17 Edema Assessment Assessed: [Left: No] [Right: No] Edema: [Left: Ye] [Right: s] Calf Left: Right: Point of Measurement: 35 cm From Medial Instep cm 32  cm Ankle Left: Right: Point of Measurement: 10 cm From Medial Instep cm 23.2 cm Vascular Assessment Pulses: Dorsalis Pedis Palpable: [Right:Yes] Electronic Signature(s) Signed: 04/06/2019 7:10:15 AM By: Kela Millin Entered By: Kela Millin on 04/05/2019 09:44:56 -------------------------------------------------------------------------------- Multi Wound Chart Details Patient Name: Date of Service: Tami Slade D. 04/05/2019 9:15 AM Medical Record [id number] Patient Account Number: [id number] Date of Birth/Sex: Treating RN: 03-Aug-1930 (85 y.o. F) Primary Care Airon Sahni: Salena Saner Other Clinician: Referring Ludger Bones: Treating Macil Crady/Extender:Robson, Rhona Leavens, Doristine Locks in Treatment: 17 Vital Signs Height(in): 62 Pulse(bpm): 42 Weight(lbs): 158 Blood Pressure(mmHg): 123/54 Body Mass Index(BMI): 29 Temperature(F): 97.8 Respiratory 18 Rate(breaths/min): Photos: [5:No Photos] [  N/A:N/A] Wound Location: [5:Right Lower Leg - Medial] [N/A:N/A] Wounding Event: [5:Gradually Appeared] [N/A:N/A] Primary Etiology: [5:Vasculopathy] [N/A:N/A] Comorbid History: [5:Deep Vein Thrombosis, Hypertension] [N/A:N/A] Date Acquired: [5:11/04/2018] [N/A:N/A] Weeks of Treatment: [5:17] [N/A:N/A] Wound Status: [5:Open] [N/A:N/A] Measurements L x W x D 0.6x0.6x0.2 [N/A:N/A] (cm) Area (cm) : [5:0.283] [N/A:N/A] Volume (cm) : [5:0.057] [N/A:N/A] % Reduction in Area: [5:92.90%] [N/A:N/A] % Reduction in Volume: 92.90% [N/A:N/A] Classification: [5:Full Thickness Without Exposed Support Structures] [N/A:N/A] Exudate Amount: [5:Small] [N/A:N/A N/A] Exudate Type: [5:Serosanguineous] [N/A:N/A N/A] Exudate Color: [5:red, brown] [N/A:N/A N/A] Wound Margin: [5:Well defined, not attached N/A] [N/A:N/A] Granulation Amount: [5:Medium (34-66%)] [N/A:N/A N/A] Granulation Quality: [5:Pink, Pale] [N/A:N/A N/A] Necrotic Amount: [5:Medium (34-66%)] [N/A:N/A N/A] Exposed  Structures: [5:Fat Layer (Subcutaneous N/A Tissue) Exposed: Yes Fascia: No Tendon: No Muscle: No Joint: No Bone: No] [N/A:N/A] Epithelialization: [5:Medium (34-66%)] [N/A:N/A N/A] Debridement: [5:Debridement - Excisional N/A] [N/A:N/A] Pre-procedure [5:10:12] [N/A:N/A N/A] Verification/Time Out Taken: Pain Control: [5:Lidocaine 4% Topical Solution] [N/A:N/A N/A] Tissue Debrided: [5:Subcutaneous, Slough] [N/A:N/A N/A] Level: [5:Skin/Subcutaneous Tissue] [N/A:N/A N/A] Debridement Area (sq cm):0.36 [N/A:N/A N/A] Instrument: [5:Curette] [N/A:N/A N/A] Bleeding: [5:Minimum] [N/A:N/A N/A] Hemostasis Achieved: [5:Pressure] [N/A:N/A N/A] Procedural Pain: [5:0] [N/A:N/A N/A] Post Procedural Pain: [5:0] [N/A:N/A N/A] Debridement Treatment Procedure was tolerated [N/A:N/A N/A] Response: [5:well] Post Debridement [5:0.6x0.6x0.2] [N/A:N/A N/A] Measurements L x W x D (cm) Post Debridement [5:0.057] [N/A:N/A N/A] Volume: (cm) Procedures Performed: Compression Therapy [5:Debridement] [N/A:N/A N/A] Treatment Notes Wound #5 (Right, Medial Lower Leg) 1. Cleanse With Wound Cleanser Soap and water 2. Periwound Care Moisturizing lotion 3. Primary Dressing Applied Endoform 4. Secondary Dressing Dry Gauze 6. Support Layer Applied 3 layer compression wrap Notes stretch net Electronic Signature(s) Signed: 04/05/2019 6:46:53 PM By: Linton Ham MD Entered By: Linton Ham on 04/05/2019 10:59:06 -------------------------------------------------------------------------------- [place] Details Patient Name: Date of Service: [name], [name] 04/05/2019 9:15 AM Medical Record [id number] Patient Account Number: [id number] Date of Birth/Sex: Treating RN: [birth year] (85 y.o. [name] Primary Care Ethell Blatchford: Salena Saner Other Clinician: Referring Shanty Ginty: Treating Kale Rondeau/Extender:Robson, Rhona Leavens, Doristine Locks in Treatment: 17 Active  Inactive Wound/Skin Impairment Nursing Diagnoses: Impaired tissue integrity Knowledge deficit related to ulceration/compromised skin integrity Goals: Patient/caregiver will verbalize understanding of skin care regimen Date Initiated: 12/07/2018 Target Resolution Date: 05/07/2019 Goal Status: Active Ulcer/skin breakdown will have a volume reduction of 30% by week 4 Date Initiated: 12/07/2018 Date Inactivated: 01/18/2019 Target Resolution Date: 01/08/2019 Goal Status: Met Interventions: Assess patient/caregiver ability to obtain necessary supplies Assess patient/caregiver ability to perform ulcer/skin care regimen upon admission and as needed Assess ulceration(s) every visit Provide education on ulcer and skin care Notes: Electronic Signature(s) Signed: 04/05/2019 6:27:46 PM By: Levan Hurst RN, BSN Entered By: Levan Hurst on 04/05/2019 10:09:55 -------------------------------------------------------------------------------- Pain Assessment Details Patient Name: Date of Service: Tami Slade D. 04/05/2019 9:15 AM Medical Record [id number] Patient Account Number: [id number] Date of Birth/Sex: Treating RN: 10-01-1930 (85 y.o. [name] Primary Care Tucker Minter: Other Clinician: Salena Saner Referring Aubery Date: Treating Lashante Fryberger/Extender:Robson, Rhona Leavens, Doristine Locks in Treatment: 17 Active Problems Location of Pain Severity and Description of Pain Patient Has Paino No Site Locations Pain Management and Medication Current Pain Management: Electronic Signature(s) Signed: 04/06/2019 7:10:15 AM By: Kela Millin Entered By: Kela Millin on 04/05/2019 09:42:25 -------------------------------------------------------------------------------- Patient/Caregiver Education Details Patient Name: Date of Service: [name] 11/30/2020andnbsp9:15 AM Medical Record [phone] Patient Account Number: [id number] Date of  Birth/Gender: Treating RN: 01-04-1931 (85 y.o. [name] Primary Care Physician: Salena Saner Other Clinician:  Referring Physician: Treating Physician/Extender:Robson, Rhona Leavens, Doristine Locks in Treatment: 17 Education Assessment Education Provided To: Patient Education Topics Provided Wound/Skin Impairment: Methods: Explain/Verbal Responses: State content correctly [employer]) Signed: 04/05/2019 6:27:46 PM By: Levan Hurst RN, BSN Entered By: Levan Hurst on 04/05/2019 10:10:21 -------------------------------------------------------------------------------- Wound Assessment Details Patient Name: Date of Service: [name], [name] 04/05/2019 9:15 AM Medical Record [id number] Patient Account Number: [id number] Date of Birth/Sex: Treating RN: 16-Oct-1930 (85 y.o. [name] Primary Care Rivka Baune: Salena Saner Other Clinician: Referring Taylar Hartsough: Treating Daquan Crapps/Extender:Robson, Rhona Leavens, Doristine Locks in Treatment: 17 Wound Status Wound Number: 5 Primary Etiology: Vasculopathy Wound Location: Right Lower Leg - Medial Wound Status: Open Wounding Event: Gradually Appeared Comorbid History: Deep Vein Thrombosis, Hypertension Date Acquired: 11/04/2018 Weeks Of Treatment: 17 Clustered Wound: No Wound Measurements Length: (cm) 0.6 % Reduc Width: (cm) 0.6 % Reduc Depth: (cm) 0.2 Epithel Area: (cm) 0.283 Tunnel Volume: (cm) 0.057 Underm Wound Description Classification: Full Thickness Without Exposed Support [place] Wound Well defined, not attached Margin: Exudate Small Amount: Exudate Serosanguineous Type: Exudate red, brown Color: Wound Bed Granulation Amount: Medium (34-66%) Granulation Quality: Pink, Pale Fascia Necrotic Amount: Medium (34-66%) Fat La Necrotic Quality: Adherent Slough Tendon Muscle Joint Bone E dor After Cleansing: No /Fibrino Yes Exposed  Structure Exposed: No yer (Subcutaneous Tissue) Exposed: Yes Exposed: No Exposed: No Exposed: No xposed: No tion in Area: 92.9% tion in Volume: 92.9% ialization: Medium (34-66%) ing: No ining: No Treatment Notes Wound #5 (Right, Medial Lower Leg) 1. Cleanse With Wound Cleanser Soap and water 2. Periwound Care Moisturizing lotion 3. Primary Dressing Applied Endoform 4. Secondary Dressing Dry Gauze 6. Support Layer Applied 3 layer compression wrap Notes stretch net Electronic Signature(s) Signed: 04/06/2019 7:10:15 AM By: Kela Millin Entered By: Kela Millin on 04/05/2019 09:47:39 -------------------------------------------------------------------------------- Vitals Details Patient Name: Date of Service: Tami Slade D. 04/05/2019 9:15 AM Medical Record [id number] Patient Account Number: [id number] Date of Birth/Sex: Treating RN: 10/26/1930 (85 y.o. [name] Primary Care Caliah Kopke: Salena Saner Other Clinician: Referring Finnlee Silvernail: Treating Cabot Crom[place]/Extender:Robson, Rhona Leavens, Doristine Locks in Treatment: 17 Vital Signs Time Taken: 09:41 Temperature (F): 97.8 Height (in): 62 Pulse (bpm): 42 Weight (lbs): 158 Respiratory Rate (breaths/min): 18 Body Mass Index (BMI): 28.9 Blood Pressure (mmHg): 123/54 Reference Range: 80 - 120 mg / dl Electronic Signature(s) Signed: 04/06/2019 7:10:15 AM By: Kela Millin Entered By: Kela Millin on 04/05/2019 09:42:07

## 2019-04-12 ENCOUNTER — Other Ambulatory Visit: Payer: Self-pay

## 2019-04-12 ENCOUNTER — Encounter (HOSPITAL_BASED_OUTPATIENT_CLINIC_OR_DEPARTMENT_OTHER): Payer: Medicare Other | Attending: Internal Medicine | Admitting: Internal Medicine

## 2019-04-12 DIAGNOSIS — I10 Essential (primary) hypertension: Secondary | ICD-10-CM | POA: Insufficient documentation

## 2019-04-12 DIAGNOSIS — I872 Venous insufficiency (chronic) (peripheral): Secondary | ICD-10-CM | POA: Insufficient documentation

## 2019-04-12 DIAGNOSIS — I70235 Atherosclerosis of native arteries of right leg with ulceration of other part of foot: Secondary | ICD-10-CM | POA: Insufficient documentation

## 2019-04-12 DIAGNOSIS — I890 Lymphedema, not elsewhere classified: Secondary | ICD-10-CM | POA: Diagnosis not present

## 2019-04-12 DIAGNOSIS — Z86718 Personal history of other venous thrombosis and embolism: Secondary | ICD-10-CM | POA: Diagnosis not present

## 2019-04-12 DIAGNOSIS — L97312 Non-pressure chronic ulcer of right ankle with fat layer exposed: Secondary | ICD-10-CM | POA: Diagnosis not present

## 2019-04-13 NOTE — Progress Notes (Signed)
Tami, Landry ([id number]) Visit Report for 03/01/2019 Arrival Information Details Patient Name: Date of Service: [name], [name] 03/01/2019 9:00 AM Medical Record [id number] Patient Account Number: [id number] Date of Birth/Sex: Treating RN: 1931-03-15 (85 y.o. [name] Primary Care Jasmina Gendron: Salena Saner Other Clinician: Referring Georgina Krist: Treating Bishop Vanderwerf/Extender:Robson, Rhona Leavens, Doristine Locks in Treatment: 12 Visit Information History Since Last Visit Cane All ordered tests and consults were completed: No Patient Arrived: 09:49 Added or deleted any medications: No Arrival Time: Any new allergies or adverse reactions: No Accompanied By: self None Had a fall or experienced change in No Transfer Assistance: activities of daily living that may affect Patient Identification Verified: Yes risk of falls: Secondary Verification Process Completed: Yes Signs or symptoms of abuse/neglect since last No Patient Requires Transmission-Based No visito Precautions: Hospitalized since last visit: No Patient Has Alerts: No Implantable device outside of the clinic excluding No cellular tissue based products placed in the center since last visit: Has Dressing in Place as Prescribed: Yes Has Compression in Place as Prescribed: Yes Pain Present Now: No Electronic Signature(s) Signed: 04/13/2019 3:01:15 PM By: Carlene Coria RN Entered By: Carlene Coria on 03/01/2019 09:50:14 -------------------------------------------------------------------------------- Compression Therapy Details Patient Name: Date of Service: [name], [name] 03/01/2019 9:00 AM Medical Record [id number] Patient Account Number: [id number] Date of Birth/Sex: Treating RN: 28-Oct-1930 (85 y.o. [name] Primary Care Kieon Lawhorn: Salena Saner Other Clinician: Referring Aeron Lheureux: Treating Vannie Hochstetler/Extender:Robson, Rhona Leavens, Doristine Locks in Treatment: 12 Compression  Therapy Performed for Wound Wound #5 Right,Medial Lower Leg Assessment: Performed By: Clinician Levan Hurst, RN Compression Type: Three Layer Post Procedure Diagnosis Same as Pre-procedure Electronic Signature(s) Signed: 03/01/2019 6:04:10 PM By: Levan Hurst RN, BSN Entered By: Levan Hurst on 03/01/2019 10:39:08 -------------------------------------------------------------------------------- Encounter Discharge Information Details Patient Name: Date of Service: Tami Slade D. 03/01/2019 9:00 AM Medical Record [id number] Patient Account Number: [id number] Date of Birth/Sex: Treating RN: [birth year] (85 y.o. [name] Primary Care Adyn Hoes: Salena Saner Other Clinician: Referring Toshiyuki Fredell: Treating Sophee Mckimmy/Extender:Robson, Rhona Leavens, Doristine Locks in Treatment: 12 Encounter Discharge Information Items Post Procedure Vitals Discharge Condition: Stable Temperature (F): 97.8 Ambulatory Status: Cane Pulse (bpm): 62 Discharge Destination: Home Respiratory Rate (breaths/min): 18 Transportation: Private Auto Blood Pressure (mmHg): 125/88 Accompanied By: daughter Schedule Follow-up Appointment: Yes Clinical Summary of Care: Electronic Signature(s) Signed: 03/01/2019 5:31:38 PM By: Deon Pilling Entered By: Deon Pilling on 03/01/2019 12:09:31 -------------------------------------------------------------------------------- Lower Extremity Assessment Details Patient Name: Date of Service: [name], [name] 03/01/2019 9:00 AM Medical Record [id number] Patient Account Number: [id number] Date of Birth/Sex: Treating RN: 02-18-1931 (85 y.o. [name] Primary Care Sherita Decoste: Salena Saner Other Clinician: Referring Alexandra Posadas: Treating Cynthia Stainback/Extender:Robson, Rhona Leavens, Doristine Locks in Treatment: 12 Edema Assessment Assessed: [Left: No] [Right: No] Edema: [Left: Ye] [Right: s] Calf Left: Right: Point of Measurement: 35 cm  From Medial Instep cm 36.5 cm Ankle Left: Right: Point of Measurement: 10 cm From Medial Instep cm 23 cm Electronic Signature(s) Signed: 04/13/2019 3:01:15 PM By: Carlene Coria RN Entered By: Carlene Coria on 03/01/2019 10:07:49 -------------------------------------------------------------------------------- Multi Wound Chart Details Patient Name: Date of Service: Tami Slade D. 03/01/2019 9:00 AM Medical Record [id number] Patient Account Number: [id number] Date of Birth/Sex: Treating RN: Jun 03, 1930 (85 y.o. [name] Primary Care Kenner Lewan: Salena Saner Other Clinician: Referring Luisdaniel Kenton: Treating Rondal Vandevelde/Extender:Robson, Rhona Leavens, Doristine Locks in Treatment: 12 Vital Signs Height(in): 62 Pulse(bpm): 62 Weight(lbs): 158 Blood Pressure(mmHg): 125/88 Body Mass Index(BMI): 29 Temperature(F): 97.8 Respiratory  18 Rate(breaths/min): Photos: [5:No Photos] [N/A:N/A] Wound Location: [5:Right Lower Leg - Medial N/A] Wounding Event: [5:Gradually Appeared] [N/A:N/A] Primary Etiology: [5:Vasculopathy] [N/A:N/A] Comorbid History: [5:Deep Vein Thrombosis, Hypertension] [N/A:N/A] Date Acquired: [5:11/04/2018] [N/A:N/A] Weeks of Treatment: [5:12] [N/A:N/A] Wound Status: [5:Open] [N/A:N/A] Measurements L x W x D 0.9x0.8x0.2 [N/A:N/A] (cm) Area (cm) : [5:0.565] [N/A:N/A] Volume (cm) : [5:0.113] [N/A:N/A] % Reduction in Area: [5:85.90%] [N/A:N/A] % Reduction in Volume: 85.90% [N/A:N/A] Classification: [5:Full Thickness Without Exposed Support Structures] [N/A:N/A] Exudate Amount: [5:Small] [N/A:N/A] Exudate Type: [5:Serosanguineous] [N/A:N/A] Exudate Color: [5:red, brown] [N/A:N/A] Wound Margin: [5:Distinct, outline attached N/A] Granulation Amount: [5:Large (67-100%)] [N/A:N/A] Granulation Quality: [5:Pink, Pale] [N/A:N/A] Necrotic Amount: [5:Small (1-33%)] [N/A:N/A] Necrotic Tissue: [5:Eschar] [N/A:N/A] Exposed Structures: [5:Fat Layer (Subcutaneous  Tissue) Exposed: Yes Fascia: No Tendon: No Muscle: No Joint: No Bone: No] [N/A:N/A] Epithelialization: [5:Medium (34-66%)] [N/A:N/A] Debridement: [5:Debridement - Excisional] [N/A:N/A] Pre-procedure [5:10:37] [N/A:N/A] Verification/Time Out Taken: Pain Control: [5:Lidocaine 4% Topical Solution] [N/A:N/A] Tissue Debrided: [5:Subcutaneous, Slough] [N/A:N/A] Level: [5:Skin/Subcutaneous Tissue] [N/A:N/A] Debridement Area (sq cm):0.72 [N/A:N/A] Instrument: [5:Curette] [N/A:N/A] Bleeding: [5:Minimum] [N/A:N/A] Hemostasis Achieved: [5:Pressure] [N/A:N/A] Procedural Pain: [5:0] [N/A:N/A] Post Procedural Pain: [5:0] [N/A:N/A] Debridement Treatment Procedure was tolerated [N/A:N/A] Response: [5:well] Post Debridement [5:0.9x0.8x0.2] [N/A:N/A] Measurements L x W x D (cm) Post Debridement [5:0.113] [N/A:N/A] Volume: (cm) Procedures Performed: Compression Therapy [5:Debridement] [N/A:N/A] Treatment Notes Electronic Signature(s) Signed: 03/01/2019 5:48:14 PM By: Linton Ham MD Signed: 03/01/2019 6:04:10 PM By: Levan Hurst RN, BSN Entered By: Linton Ham on 03/01/2019 11:43:33 -------------------------------------------------------------------------------- Multi-Disciplinary Care Plan Details Patient Name: Date of Service: Tami Slade D. 03/01/2019 9:00 AM Medical Record [id number] Patient Account Number: [id number] Date of Birth/Sex: Treating RN: 09-29-1930 (85 y.o. [name] Primary Care Jawad Wiacek: Salena Saner Other Clinician: Referring Ha Shannahan: Treating Elowen Debruyn/Extender:Robson, Rhona Leavens, Doristine Locks in Treatment: 12 Active Inactive Wound/Skin Impairment Nursing Diagnoses: Impaired tissue integrity Knowledge deficit related to ulceration/compromised skin integrity Goals: Patient/caregiver will verbalize understanding of skin care regimen Date Initiated: 12/07/2018 Target Resolution Date: 04/02/2019 Goal Status: Active Ulcer/skin  breakdown will have a volume reduction of 30% by week 4 Date Initiated: 12/07/2018 Date Inactivated: 01/18/2019 Target Resolution Date: 01/08/2019 Goal Status: Met Interventions: Assess patient/caregiver ability to obtain necessary supplies Assess patient/caregiver ability to perform ulcer/skin care regimen upon admission and as needed Assess ulceration(s) every visit Provide education on ulcer and skin care Notes: Electronic Signature(s) Signed: 03/01/2019 6:04:10 PM By: Levan Hurst RN, BSN Entered By: Levan Hurst on 03/01/2019 10:38:04 -------------------------------------------------------------------------------- Pain Assessment Details Patient Name: Date of Service: Tami Slade D. 03/01/2019 9:00 AM Medical Record [id number] Patient Account Number: [id number] Date of Birth/Sex: Treating RN: 06-26-1930 (85 y.o. [name] Primary Care Charlye Spare: Salena Saner Other Clinician: Referring Stan Cantave: Treating Janeth Terry/Extender:Robson, Rhona Leavens, Doristine Locks in Treatment: 12 Active Problems Location of Pain Severity and Description of Pain Patient Has Paino No Site Locations Pain Management and Medication Current Pain Management: Electronic Signature(s) Signed: 04/13/2019 3:01:15 PM By: Carlene Coria RN Entered By: Carlene Coria on 03/01/2019 09:51:12 -------------------------------------------------------------------------------- Patient/Caregiver Education Details Patient Name: Date of Service: [name] 10/26/2020andnbsp9:00 AM Medical Record [id number] Patient Account Number: [id number] Date of Birth/Gender: Treating RN: 14-May-1930 (85 y.o. [name] Primary Care Physician: Salena Saner Other Clinician: Referring Physician: Treating Physician/Extender:Robson, Rhona Leavens, Doristine Locks in Treatment: 12 Education Assessment Education Provided To: Patient Education Topics Provided Wound/Skin  Impairment: Methods: Explain/Verbal Responses: State content correctly [employer]) Signed: 03/01/2019 6:04:10 PM By: Levan Hurst RN, BSN Entered By: Levan Hurst on 03/01/2019  10:40:13 -------------------------------------------------------------------------------- Wound Assessment Details Patient Name: Date of Service: [name], [name] 03/01/2019 9:00 AM Medical Record [id number] Patient Account Number: [id number] Date of Birth/Sex: Treating RN: Oct 19, 1930 (85 y.o. [name] Primary Care Latrece Nitta: Salena Saner Other Clinician: Referring Yoniel Arkwright: Treating Jerrett Baldinger/Extender:Robson, Rhona Leavens, Doristine Locks in Treatment: 12 Wound Status Wound Number: 5 Primary Etiology: Vasculopathy Wound Location: Right Lower Leg - Medial Wound Status: Open Wounding Event: Gradually Appeared Comorbid History: Deep Vein Thrombosis, Hypertension Date Acquired: 11/04/2018 Weeks Of Treatment: 12 Clustered Wound: No Photos Wound Measurements Length: (cm) 0.9 % Reduc Width: (cm) 0.8 % Reduc Depth: (cm) 0.2 Epithel Area: (cm) 0.565 Tunnel Volume: (cm) 0.113 Underm Wound Description Full Thickness Without Exposed Support Foul O Classification: Structures Slough Wound Distinct, outline attached Margin: Exudate Small Amount: Exudate Serosanguineous Type: Exudate red, brown Color: Wound Bed Granulation Amount: Large (67-100%) Granulation Quality: Pink, Pale Fascia Necrotic Amount: Small (1-33%) Fat Lay Necrotic Quality: Eschar Tendon Muscle Joint E Bone Ex Electronic Signature(s) Signed: 03/02/2019 3:44:28 PM By: [name] EMT/HBOT Signed: 04/13/2019 3:01:15 PM By: Carlene Coria RN Entered By: [name] on 10/27/ dor After Cleansing: No [name] Yes Exposed Structure Exposed: No er (Subcutaneous Tissue) Exposed: Yes Exposed: No Exposed: No xposed: No posed: No 2020 08:31:41 tion in Area: 85.9% tion in Volume:  85.9% ialization: Medium (34-66%) ing: No ining: No -------------------------------------------------------------------------------- Vitals Details Patient Name: Date of Service: [name], [name] 03/01/2019 9:00 AM Medical Record [id number] Patient Account Number: [id number] Date of Birth/Sex: Treating RN: 12-05-1930 (85 y.o. [name] Primary Care Fronia Depass: Salena Saner Other Clinician: Referring Jalana Moore: Treating Raphael Fitzpatrick/Extender:Robson, Rhona Leavens, Doristine Locks in Treatment: 12 Vital Signs Time Taken: 09:50 Temperature (F): 97.8 Height (in): 62 Pulse (bpm): 62 Weight (lbs): 158 Respiratory Rate (breaths/min): 18 Body Mass Index (BMI): 28.9 Blood Pressure (mmHg): 125/88 Reference Range: 80 - 120 mg / dl Electronic Signature(s) Signed: 04/13/2019 3:01:15 PM By: Carlene Coria RN Entered By: Carlene Coria on 03/01/2019 09:50:48

## 2019-04-13 NOTE — Progress Notes (Signed)
[name], [name] ([id number]) Visit Report for 04/12/2019 Debridement Details Patient Name: Date of Service: [name], [name] 04/12/2019 9:15 AM Medical Record [name] Patient Account Number: [id number] Date of Birth/Sex: Treating RN: 31-May-1930 (85 y.o. F) Primary Care Provider: Salena Saner Other Clinician: Referring Provider: Treating Provider/Extender:Wille Aubuchon, Rhona Leavens, Doristine Locks in Treatment: 18 Debridement Performed for Wound #5 Right,Medial Lower Leg Assessment: Performed By: Physician Ricard Dillon., MD Debridement Type: Debridement Level of Consciousness (Pre- Awake and Alert procedure): Pre-procedure Yes - 10:30 Verification/Time Out Taken: Start Time: 10:30 Pain Control: Lidocaine 5% topical ointment Total Area Debrided (L x W): 0.7 (cm) x 0.5 (cm) = 0.35 (cm) Tissue and other material Viable, Non-Viable, Slough, Subcutaneous, Slough debrided: Level: Skin/Subcutaneous Tissue Debridement Description: Excisional Instrument: Curette Bleeding: Minimum Hemostasis Achieved: Pressure End Time: 10:30 Procedural Pain: 0 Post Procedural Pain: 0 Response to Treatment: Procedure was tolerated well Level of Consciousness Awake and Alert (Post-procedure): Post Debridement Measurements of Total Wound Length: (cm) 0.7 Width: (cm) 0.5 Depth: (cm) 0.5 Volume: (cm) 0.137 Character of Wound/Ulcer Post Improved Debridement: Post Procedure Diagnosis Same as Pre-procedure Electronic Signature(s) Signed: 04/13/2019 10:01:17 AM By: Linton Ham MD Entered By: Linton Ham on 04/12/2019 11:03:45 -------------------------------------------------------------------------------- HPI Details Patient Name: Date of Service: Tami Slade D. 04/12/2019 9:15 AM Medical Record [id number] Patient Account Number: [id number] Date of Birth/Sex: Treating RN: 09-Aug-1930 (85 y.o. 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 Patient was given a course of doxycycline for 7 days that she completed, has been putting mupirocin cream on the wounds, leaving it exposed at night and covering with dry gauze during the day. Overall patient who is present with her daughter in the clinic today states that the 2 wounds have been improving to the present size. There is also some seepage in and around the wound area. Both legs are swollen. No changes to dimensions of legs overall in the recent past. Patient was remotely seen here in the clinic in 2014 for right lower leg tibial wounds very similar to the ones were seeing today. 8/10-Patient returns after being initiated in the wound clinic last week. The right medial leg wounds about the right ankle are both slightly smaller in size, not much seepage, legs are not as swollen we are using a 3 layer compression of the right juxta lite on the left she denies any significant pain 8/17; the patient has 2 small open areas on the medial right leg. She has good edema control. There is no doubt she has underlying lymphedema. Still a lot of adherent debris to both wounds 8/24; same 2 small open areas on the medial right leg. Edema control. We have been using Iodoflex since last week and the surface of the wound looks some better. 9/14; 2 small openings on the right medial lower leg. The patient went for her venous reflux studies in [place] [facility]. 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 The findings were similar to when  examined 2016. There was normal appearance of the great saphenous veins and short saphenous veins bilaterally no evidence for superficial venous reflux and no varicosities. No  indication for venous ablation We have been using Iodoflex. Changed to Sorbact today 9/21; 2 small openings in close juxtaposition on the right medial lower leg. She had difficulties this week with her 3 layer compression becoming too tight she took this off late last Wednesday/Thursday morning. She did not put anything else on her leg quite a bit of nonpitting/lymphedema today still using Sorbact 9/28; 2 small wounds in close juxtaposition in the right medial lower leg. We have been using Sorbact. Changed to endoform today 10/5; 2 small wounds in close juxtaposition the right medial lower leg I changed to endoform last week unfortunately not much improvement 10/12; 2 small wounds in close juxtaposition on the right lower leg in the setting of severe chronic venous insufficiency secondary to prior DVTs. 10/19; 2 small wounds in close juxtaposition in the right lower leg in the setting of severe chronic venous insufficiency secondary to prior DVTs. The more anterior one is closed however the more posterior one still has not changed that much. Considerable depth. We have been using endoform 10/26; not much change in the 1 remaining wound. Still deep and punched out looking. Adherent surface slough. We have been using endoform. We have been trying to run Grafix PL but have not heard a response yet 11/2; wound is measuring slightly smaller. Depth looks like it is come in better. Better looking surface although still requiring a light debridement. Grafix PL not covered by [employer], we will run Oasis 11/9; not much change in dimensions although the wound cleans up quite well. Still have not heard about Oasis using endoform 11/16; the patient has a $200 co-pay for Oasis. She is using endoform and making slow but steady  improvements. The wound is cleaning up quite nicely 11/30; using endoform under compression. One wound remaining. Still debris on the surface. Unacceptable co-pay for advanced treatment products 12/7; still using endoform under compression. 1 more wound remaining. Still requiring debridement. Electronic Signature(s) Signed: 04/13/2019 10:01:17 AM By: Baltazar Najjarobson, Alaila Pillard MD Entered By: Baltazar Najjarobson, Ayushi Pla on 04/12/2019 11:06:29 -------------------------------------------------------------------------------- Physical Exam Details Patient Name: Date of Service: [name], Tami D. 04/12/2019 9:15 AM Medical Record [id number] Patient Account Number: [id number] Date of Birth/Sex: Treating RN: 02/12/1931 (85 y.o. F) Primary Care Provider: Alva GarnetShelton, Kimberly R Other Clinician: Referring Provider: Treating Provider/Extender:Matea Stanard, Gaylene BrooksMichael Shelton, Ivonne AndrewKimberly R Weeks in Treatment: 18 Constitutional Sitting or standing Blood Pressure is within target range for patient.. Pulse regular and within target range for patient.[name]. Respirations regular, non-labored and within target range.. Temperature is normal and within the target range for the patient.[name]. Appears in no distress. Respiratory work of breathing is normal. Cardiovascular Pedal pulses palpable. Notes Wound exam; right medial lower leg. Small punched out area. Again debrided with a #3 curette to a healthy surface. It does not appear that we are making much progress with regards to the wound closure using endoform. Electronic Signature(s) Signed: 04/13/2019 10:01:17 AM By: Baltazar Najjarobson, Ronni Osterberg MD Entered By: Baltazar Najjarobson, Kanyon Seibold on 04/12/2019 11:05:40 -------------------------------------------------------------------------------- Physician Orders Details Patient Name: Date of Service: [name], Tami D. 04/12/2019 9:15 AM Medical Record [id number] Patient Account Number: [id number] Date of Birth/Sex: Treating RN: 04/16/1931 (85 y.o. [name]) [name],  Shatara Primary Care Provider: Alva GarnetShelton, Kimberly R Other Clinician: Referring Provider: Treating Provider/Extender:Adeola Dennen, Gaylene BrooksMichael Shelton, Ivonne AndrewKimberly R Weeks in Treatment: 18 Verbal / Phone Orders: No Diagnosis Coding ICD-10 Coding Code Description L97.312 Non-pressure chronic ulcer of right ankle with fat layer exposed I89.0 Lymphedema, not elsewhere classified I87.2 Venous insufficiency (chronic) (peripheral) I70.235 Atherosclerosis of native arteries  of right leg with ulceration of other part of foot Follow-up Appointments Return Appointment in 1 week. Dressing Change Frequency Wound #5 Right,Medial Lower Leg Do not change entire dressing for one week. Skin Barriers/Peri-Wound Care Wound #5 Right,Medial Lower Leg Moisturizing lotion Wound Cleansing Wound #5 Right,Medial Lower Leg May shower with protection. Primary Wound Dressing Wound #5 Right,Medial Lower Leg Polymem Silver Secondary Dressing Wound #5 Right,Medial Lower Leg Dry Gauze Edema Control 3 Layer Compression System - Right Lower Extremity Avoid standing for long periods of time Elevate legs to the level of the heart or above for 30 minutes daily and/or when sitting, a frequency of: - throughout the day Exercise regularly Support Garment 20-30 mm/Hg pressure to: - Juxtalite to left leg Electronic Signature(s) Signed: 04/12/2019 5:51:44 PM By: Zandra AbtsLynch, Shatara RN, BSN Signed: 04/13/2019 10:01:17 AM By: Baltazar Najjarobson, Joanne Brander MD Entered By: Zandra AbtsLynch, Shatara on 04/12/2019 10:31:17 -------------------------------------------------------------------------------- Problem List Details Patient Name: Date of Service: [name], Tami D. 04/12/2019 9:15 AM Medical Record [id number] Patient Account Number: [id number] Date of Birth/Sex: Treating RN: 02/28/1931 (85 y.o. [name]) [name], Shatara Primary Care Provider: Alva GarnetShelton, Kimberly R Other Clinician: Referring Provider: Treating Provider/Extender:Azari Janssens, Gaylene BrooksMichael Shelton, Ivonne AndrewKimberly  R Weeks in Treatment: 18 Active Problems ICD-10 Evaluated Encounter Code Description Active Date Today Diagnosis L97.312 Non-pressure chronic ulcer of right ankle with fat layer 12/07/2018 No Yes exposed I89.0 Lymphedema, not elsewhere classified 12/21/2018 No Yes I87.2 Venous insufficiency (chronic) (peripheral) 12/07/2018 No Yes I70.235 Atherosclerosis of native arteries of right leg with 02/01/2019 No Yes ulceration of other part of foot Inactive Problems Resolved Problems Electronic Signature(s) Signed: 04/13/2019 10:01:17 AM By: Baltazar Najjarobson, Cloy Cozzens MD Entered By: Baltazar Najjarobson, Nimrat Woolworth on 04/12/2019 11:03:29 -------------------------------------------------------------------------------- Progress Note Details Patient Name: Date of Service: [name], Tami D. 04/12/2019 9:15 AM Medical Record [id number] Patient Account Number: [id number] Date of Birth/Sex: Treating RN: 07/31/1930 (85 y.o. 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 Patient was given a course of doxycycline for 7 days that she completed, has been putting mupirocin cream on the wounds, leaving it exposed at night and covering with dry gauze during the day. Overall patient who is present with her daughter in the clinic today states that the 2 wounds have been improving to the present size. There is also some seepage in and around the wound area. Both legs are swollen. No changes to dimensions of legs overall in the recent past. Patient was remotely seen here in the clinic in 2014 for right lower leg  tibial wounds very similar to the ones were seeing today. 8/10-Patient returns after being initiated in the wound clinic last week. The right medial leg wounds about the right ankle are both slightly smaller in size, not much seepage, legs are not as swollen we are using a 3 layer compression of the right juxta lite on the left she denies any significant pain 8/17; the patient has 2 small open areas on the medial right leg. She has good edema control. There is no doubt she has underlying lymphedema. Still a lot of adherent debris to both wounds 8/24; same 2 small open areas on the medial right leg. Edema control. We have been using Iodoflex since  last week and the surface of the wound looks some better. 9/14; 2 small openings on the right medial lower leg. The patient went for her venous reflux studies in [place] [facility]. 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 The findings were similar to when examined 2016. There was normal appearance of the great saphenous veins and short saphenous veins bilaterally no evidence for superficial venous reflux and no varicosities. No indication for venous ablation We have been using Iodoflex. Changed to Sorbact today 9/21; 2 small openings in close juxtaposition on the right medial lower leg. She had difficulties this week with her 3 layer compression becoming too tight she took this off late last Wednesday/Thursday morning. She did not put anything else on her leg quite a bit of nonpitting/lymphedema today still using Sorbact 9/28; 2 small wounds in close juxtaposition in the right medial lower leg. We have been using Sorbact. Changed to endoform today 10/5; 2 small wounds in close juxtaposition the right medial lower leg I changed to endoform last week unfortunately not much improvement 10/12; 2 small wounds in close juxtaposition on the right lower leg in the setting of severe chronic  venous insufficiency secondary to prior DVTs. 10/19; 2 small wounds in close juxtaposition in the right lower leg in the setting of severe chronic venous insufficiency secondary to prior DVTs. The more anterior one is closed however the more posterior one still has not changed that much. Considerable depth. We have been using endoform 10/26; not much change in the 1 remaining wound. Still deep and punched out looking. Adherent surface slough. We have been using endoform. We have been trying to run Grafix PL but have not heard a response yet 11/2; wound is measuring slightly smaller. Depth looks like it is come in better. Better looking surface although still requiring a light debridement. Grafix PL not covered by [place] [facility], we will run Oasis 11/9; not much change in dimensions although the wound cleans up quite well. Still have not heard about Oasis using endoform 11/16; the patient has a $200 co-pay for Oasis. She is using endoform and making slow but steady improvements. The wound is cleaning up quite nicely 11/30; using endoform under compression. One wound remaining. Still debris on the surface. Unacceptable co-pay for advanced treatment products 12/7; still using endoform under compression. 1 more wound remaining. Still requiring debridement. Objective Constitutional Sitting or standing Blood Pressure is within target range for patient.. Pulse regular and within target range for patient.[name] Respirations regular, non-labored and within target range.. Temperature is normal and within the target range for the patient.[name] Appears in no distress. Vitals Time Taken: 10:00 AM, Height: 62 in, Weight: 158 lbs, BMI: 28.9, Temperature: 97.5 F, Pulse: 41 bpm, Respiratory Rate: 18 breaths/min, Blood Pressure: 115/46 mmHg. Respiratory work of breathing is normal. Cardiovascular Pedal pulses palpable. General Notes: Wound exam; right medial lower leg. Small punched out area. Again debrided  with a #3 curette to a healthy surface. It does not appear that we are making much progress with regards to the wound closure using endoform. Integumentary (Hair, Skin) Wound #5 status is Open. Original cause of wound was Gradually Appeared. The wound is located on the Right,Medial Lower Leg. The wound measures 0.7cm length x 0.5cm width x 0.5cm depth; 0.275cm^2 area and 0.137cm^3 volume. There is Fat Layer (Subcutaneous Tissue) Exposed exposed. There is no tunneling or undermining noted. There is a small amount of serosanguineous drainage noted. The wound margin  is well defined and not attached to the wound base. There is medium (34-66%) pink, pale granulation within the wound bed. There is a small (1-33%) amount of necrotic tissue within the wound bed including Adherent Slough. 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 There was a Excisional Skin/Subcutaneous Tissue Debridement with a total area of 0.35 sq cm performed by Maxwell Caul., MD. With the following instrument(s): Curette to remove Viable and Non-Viable tissue/material. Material removed includes Subcutaneous Tissue and Slough and after achieving pain control using Lidocaine 5% topical ointment. No specimens were taken. A time out was conducted at 10:30, prior to the start of the procedure. A Minimum amount of bleeding was controlled with Pressure. The procedure was tolerated well with a pain level of 0 throughout and a pain level of 0 following the procedure. Post Debridement Measurements: 0.7cm length x 0.5cm width x 0.5cm depth; 0.137cm^3 volume. Character of Wound/Ulcer Post Debridement  is improved. Post procedure Diagnosis Wound #5: Same as Pre-Procedure Pre-procedure diagnosis of Wound #5 is a Vasculopathy located on the Right,Medial Lower Leg . There was a Three Layer Compression Therapy Procedure by Zandra Abts, RN. Post procedure Diagnosis Wound #5: Same as Pre-Procedure Plan Follow-up Appointments: Return Appointment in 1 week. Dressing Change Frequency: Wound #5 Right,Medial Lower Leg: Do not change entire dressing for one week. Skin Barriers/Peri-Wound Care: Wound #5 Right,Medial Lower Leg: Moisturizing lotion Wound Cleansing: Wound #5 Right,Medial Lower Leg: May shower with protection. Primary Wound Dressing: Wound #5 Right,Medial Lower Leg: Polymem Silver Secondary Dressing: Wound #5 Right,Medial Lower Leg: Dry Gauze Edema Control: 3 Layer Compression System - Right Lower Extremity Avoid standing for long periods of time Elevate legs to the level of the heart or above for 30 minutes daily and/or when sitting, a frequency of: - throughout the day Exercise regularly Support Garment 20-30 mm/Hg pressure to: - Juxtalite to left leg 1. I change the primary dressing to polymen Ag. Still under the same compression which is 3 layer Electronic Signature(s) Signed: 04/13/2019 10:01:17 AM By: Baltazar Najjar MD Entered By: Baltazar Najjar on 04/12/2019 11:07:06 -------------------------------------------------------------------------------- SuperBill Details Patient Name: Date of Service: [name], [name] 04/12/2019 Medical Record [id number] Patient Account Number: [id number] Date of Birth/Sex: Treating RN: 1930-05-14 (85 y.o. F) Primary Care Provider: Alva Garnet Other Clinician: Referring Provider: Treating Provider/Extender:Deidrick Rainey, Gaylene Sebek, Ivonne Andrew in Treatment: 18 Diagnosis Coding ICD-10 Codes Code Description L97.312 Non-pressure chronic ulcer of right ankle with fat layer exposed I89.0 Lymphedema, not elsewhere  classified I87.2 Venous insufficiency (chronic) (peripheral) I70.235 Atherosclerosis of native arteries of right leg with ulceration of other part of foot Facility Procedures CPT4 Code Description: [id number] 11042 - DEB SUBQ TISSUE 20 SQ CM/< ICD-10 Diagnosis Description L97.312 Non-pressure chronic ulcer of right ankle with fat layer expo Modifier: sed Quantity: 1 Physician Procedures CPT4 Code Description: [id number] 11042 - WC PHYS SUBQ TISS 20 SQ CM ICD-10 Diagnosis Description L97.312 Non-pressure chronic ulcer of right ankle with fat laye Modifier: r exposed Quantity: 1 Electronic Signature(s) Signed: 04/13/2019 10:01:17 AM By: Baltazar Najjar MD Entered By: Baltazar Najjar on 04/12/2019 11:07:33

## 2019-04-13 NOTE — Progress Notes (Signed)
GENEVIENE, TESCH ([id number]) Visit Report for 01/18/2019 Arrival Information Details Patient Name: Date of Service: [name], [name] 01/18/2019 8:30 AM Medical Record [id number] Patient Account Number: [id number] Date of Birth/Sex: Treating RN: 1930-10-12 (85 y.o. [name], [name] Primary Care [name]: [name] Other Clinician: Referring [name]: Treating [name]/Extender:[name] in Treatment: 6 Visit Information History Since Last Visit Added or deleted any medications: No Patient Arrived: Ambulatory Any new allergies or adverse reactions: No Arrival Time: 08:35 Had a fall or experienced change in No Accompanied By: self activities of daily living that may affect Transfer Assistance: None risk of falls: Patient Identification Verified: Yes Signs or symptoms of abuse/neglect since last No Secondary Verification Process Completed: Yes visito Patient Requires Transmission-Based No Hospitalized since last visit: No Precautions: Implantable device outside of the clinic excluding No Patient Has Alerts: No cellular tissue based products placed in the center since last visit: Has Dressing in Place as Prescribed: Yes Pain Present Now: No Electronic Signature(s) Signed: 04/13/2019 3:04:24 PM By: [name] Entered By: [name] on 01/18/2019 08:39:43 -------------------------------------------------------------------------------- Compression Therapy Details Patient Name: Date of Service: [name], [name] 01/18/2019 8:30 AM Medical Record [id number] Patient Account Number: [id number] Date of Birth/Sex: Treating RN: October 22, 1930 (85 y.o. [name] Primary Care [name]: [name] Other Clinician: Referring [name]: Treating Vanesha Athens/Extender:[name] in Treatment: 6 Compression Therapy Performed for Wound Wound #5 Right,Medial Lower Leg Assessment: Performed By:  Clinician Tami Pilling, RN Compression Type: Three Layer Pre Treatment ABI: 1.6 Post Procedure Diagnosis Same as Pre-procedure Electronic Signature(s) Signed: 01/18/2019 5:18:15 PM By: [name] Entered By: [name] on 01/18/2019 08:52:24 -------------------------------------------------------------------------------- Encounter Discharge Information Details Patient Name: Date of Service: [name]. 01/18/2019 8:30 AM Medical Record [id number] Patient Account Number: [id number] Date of Birth/Sex: Treating RN: 05/04/1931 (85 y.o. [name] Primary Care Allannah Kempen: [name] Other Clinician: Referring [name]: Treating Ellah Otte/Extender:[name] in Treatment: 6 Encounter Discharge Information Items Post Procedure Vitals Discharge Condition: Stable Temperature (F): 98.1 Ambulatory Status: Ambulatory Pulse (bpm): 41 Discharge Destination: Home Respiratory Rate (breaths/min): 18 Transportation: Private Auto Blood Pressure (mmHg): 134/52 Accompanied By: self Schedule Follow-up Appointment: Yes Clinical Summary of Care: Electronic Signature(s) Signed: 01/18/2019 5:18:15 PM By: [name] Entered By: [name] on 01/18/2019 09:05:58 -------------------------------------------------------------------------------- Lower Extremity Assessment Details Patient Name: Date of Service: [name] 01/18/2019 8:30 AM Medical Record [id number] Patient Account Number: [id number] Date of Birth/Sex: Treating RN: 1931/05/04 (85 y.o. [name], [name] Primary Care [name]: [name] Other Clinician: Referring [name]: Treating Avantae Bither/Extender:[name] in Treatment: 6 Edema Assessment Assessed: [Left: No] [Right: Yes] Edema: [Left: N] [Right: o] Calf Left: Right: Point of Measurement: 35 cm From Medial Instep cm 30.5 cm Ankle Left: Right: Point of Measurement: 10 cm  From Medial Instep cm 21.5 cm Vascular Assessment Pulses: Dorsalis Pedis Palpable: [Right:Yes] Electronic Signature(s) Signed: 01/18/2019 5:18:15 PM By: [name] Entered By: [name] on 01/18/2019 08:51:31 -------------------------------------------------------------------------------- Multi Wound Chart Details Patient Name: Date of Service: Tami Slade D. 01/18/2019 8:30 AM Medical Record [id number] Patient Account Number: [id number] Date of Birth/Sex: Treating RN: January 12, 1931 (85 y.o. [name] Primary Care [name]: [name] Other Clinician: Referring [name]: Treating Erna Brossard/Extender:[name] in Treatment: 6 Vital Signs Height(in): 62 Pulse(bpm): 41 Weight(lbs): 158 Blood Pressure(mmHg): 134/52 Body Mass Index(BMI): 29 Temperature(F): 98.1 Respiratory 18 Rate(breaths/min): Photos: [5:No Photos] [N/A:N/A] Wound Location: [  5:Right Lower Leg - Medial] [N/A:N/A] Wounding Event: [5:Gradually Appeared] [N/A:N/A] Primary Etiology: [5:Vasculopathy] [N/A:N/A] Comorbid History: [5:Deep Vein Thrombosis, Hypertension] [N/A:N/A] Date Acquired: [5:11/04/2018] [N/A:N/A] Weeks of Treatment: [5:6] [N/A:N/A] Wound Status: [5:Open] [N/A:N/A] Measurements L x W x D 1x2.2x0.2 [N/A:N/A] (cm) Area (cm) : [5:1.728] [N/A:N/A] Volume (cm) : [5:0.346] [N/A:N/A] % Reduction in Area: [5:56.90%] [N/A:N/A] % Reduction in Volume: 56.80% [N/A:N/A] Classification: [5:Full Thickness Without Exposed Support Structures] [N/A:N/A] Exudate Amount: [5:Medium] [N/A:N/A N/A] Exudate Type: [5:Serosanguineous] [N/A:N/A N/A] Exudate Color: [5:red, brown] [N/A:N/A N/A] Wound Margin: [5:Distinct, outline attached N/A] [N/A:N/A] Granulation Amount: [5:Large (67-100%)] [N/A:N/A N/A] Granulation Quality: [5:Pink, Pale] [N/A:N/A N/A] Necrotic Amount: [5:None Present (0%)] [N/A:N/A N/A] Exposed Structures: [5:Fat Layer (Subcutaneous N/A  Tissue) Exposed: Yes Fascia: No Tendon: No Muscle: No Joint: No Bone: No] [N/A:N/A] Epithelialization: [5:Small (1-33%)] [N/A:N/A N/A] Debridement: [5:Debridement - Excisional N/A] [N/A:N/A] Pre-procedure [5:08:55] [N/A:N/A N/A] Verification/Time Out Taken: Pain Control: [5:Lidocaine 4% Topical Solution] [N/A:N/A N/A] Tissue Debrided: [5:Subcutaneous, Slough] [N/A:N/A N/A] Level: [5:Skin/Subcutaneous Tissue] [N/A:N/A N/A] Debridement Area (sq cm):2.2 [N/A:N/A N/A] Instrument: [5:Curette] [N/A:N/A N/A] Bleeding: [5:Minimum] [N/A:N/A N/A] Hemostasis Achieved: [5:Pressure] [N/A:N/A N/A] Procedural Pain: [5:0] [N/A:N/A N/A] Post Procedural Pain: [5:0] [N/A:N/A N/A] Debridement Treatment Procedure was tolerated [N/A:N/A N/A] Response: [5:well] Post Debridement [5:1x2.2x0.2] [N/A:N/A N/A] Measurements L x W x D (cm) Post Debridement [5:0.346] [N/A:N/A N/A] Volume: (cm) Procedures Performed: Compression Therapy [5:Debridement] [N/A:N/A N/A] Treatment Notes Wound #5 (Right, Medial Lower Leg) 1. Cleanse With Wound Cleanser Soap and water 2. Periwound Care Moisturizing lotion 3. Primary Dressing Applied Other primary dressing (specifiy in notes) 4. Secondary Dressing Dry Gauze 6. Support Layer Applied 3 layer compression wrap Notes cutimed sorbact as primary dressing. netting. Electronic Signature(s) Signed: 01/18/2019 5:18:15 PM By: [name] Signed: 01/19/2019 9:27:38 AM By: Linton Ham MD Entered By: Linton Ham on 01/18/2019 09:09:48 -------------------------------------------------------------------------------- Multi-Disciplinary Care Plan Details Patient Name: Date of Service: Tami Slade D. 01/18/2019 8:30 AM Medical Record [id number] Patient Account Number: [id number] Date of Birth/Sex: Treating RN: 1930-05-30 (85 y.o. [name] Primary Care Otie Headlee: [name] Other Clinician: Referring Ahlijah Raia: Treating Harley Mccartney/Extender:Robson,  Rhona Leavens, Doristine Landry in Treatment: 6 Active Inactive Venous Leg Ulcer Nursing Diagnoses: Actual venous Insuffiency (use after diagnosis is confirmed) Knowledge deficit related to disease process and management Goals: Patient will maintain optimal edema control Date Initiated: 12/07/2018 Target Resolution Date: 02/05/2019 Goal Status: Active Patient/caregiver will verbalize understanding of disease process and disease management Date Initiated: 12/07/2018 Date Inactivated: 01/18/2019 Target Resolution Date: 01/18/2019 Goal Status: Met Interventions: Assess peripheral edema status every visit. Compression as ordered Provide education on venous insufficiency Notes: Electronic Signature(s) Signed: 01/18/2019 5:18:15 PM By: [name] Entered By: [name] on 01/18/2019 08:46:55 -------------------------------------------------------------------------------- Pain Assessment Details Patient Name: Date of Service: [name], [name] 01/18/2019 8:30 AM Medical Record [id number] Patient Account Number: [id number] Date of Birth/Sex: Treating RN: April 23, 1931 (85 y.o. [name] Primary Care Krishan Mcbreen: Other Clinician: Salena Landry Referring Vannesa Abair: Treating Tremaine Earwood/Extender:[name] in Treatment: 6 Active Problems Location of Pain Severity and Description of Pain Patient Has Paino No Site Locations Pain Management and Medication Current Pain Management: Electronic Signature(s) Signed: 01/18/2019 5:18:15 PM By: [name] Signed: 04/13/2019 3:04:24 PM By: [name] Entered By: [name] on 01/18/2019 08:42:26 -------------------------------------------------------------------------------- Patient/Caregiver Education Details Patient Name: Date of Service: [name] 9/14/2020andnbsp8:30 AM Medical Record [id number] Patient Account Number: [id number] Date of Birth/Gender: Treating  RN: April 20, 1931 (85 y.o. [name] Primary Care Physician: Karlton Lemon,  Royetta Crochet Other Clinician: Referring Physician: Treating Physician/Extender:[name] in Treatment: 6 Education Assessment Education Provided To: Patient Education Topics Provided Venous: Handouts: Managing Venous Disease and Related Ulcers Methods: Explain/Verbal Responses: Reinforcements needed Electronic Signature(s) Signed: 01/18/2019 5:18:15 PM By: [name] Entered By: [name] on 01/18/2019 08:47:08 -------------------------------------------------------------------------------- Wound Assessment Details Patient Name: Date of Service: [name], [name] 01/18/2019 8:30 AM Medical Record [id number] Patient Account Number: [id number] Date of Birth/Sex: Treating RN: [birth year] (85 y.o. [name], [name] Primary Care Ezzie Senat: [name] Other Clinician: Referring Corayma Cashatt: Treating Trigger Frasier/Extender:[name] in Treatment: 6 Wound Status Wound Number: 5 Primary Etiology: Vasculopathy Wound Location: Right Lower Leg - Medial Wound Status: Open Wounding Event: Gradually Appeared Comorbid History: Deep Vein Thrombosis, Hypertension Date Acquired: 11/04/2018 Weeks Of Treatment: 6 Clustered Wound: No Photos Wound Measurements Length: (cm) 1 % Reduc Width: (cm) 2.2 % Reduc Depth: (cm) 0.2 Epithel Area: (cm) 1.728 Tunnel Volume: (cm) 0.346 Underm Wound Description Full Thickness Without Exposed Support Foul Od Classification: Structures Slough/ Wound Distinct, outline attached Margin: Exudate Medium Amount: Exudate Serosanguineous Type: Exudate red, brown Color: Wound Bed Granulation Amount: Large (67-100%) Granulation Quality: Pink, Pale Fascia Ex Necrotic Amount: None Present (0%) Fat Layer Tendon Ex Muscle Ex Joint Exp Bone Expo or After Cleansing: No Fibrino No Exposed Structure posed:  No (Subcutaneous Tissue) Exposed: Yes posed: No posed: No osed: No sed: No tion in Area: 56.9% tion in Volume: 56.8% ialization: Small (1-33%) ing: No ining: No Electronic Signature(s) Signed: 01/19/2019 4:42:49 PM By: Mikeal Hawthorne EMT/HBOT Signed: 01/19/2019 6:35:32 PM By: [name] Previous Signature: 01/18/2019 5:18:15 PM Version By: [name] Entered By: Mikeal Hawthorne on 01/19/2019 10:48:39 -------------------------------------------------------------------------------- Vitals Details Patient Name: Date of Service: Tami Slade D. 01/18/2019 8:30 AM Medical Record [id number] Patient Account Number: [id number] Date of Birth/Sex: Treating RN: March 27, 1931 (85 y.o. [name], [name] Primary Care Idriss Quackenbush: [name] Other Clinician: Referring Kalianna Verbeke: Treating Sharlotte Baka/Extender:[name] in Treatment: 6 Vital Signs Time Taken: 08:39 Temperature (F): 98.1 Height (in): 62 Pulse (bpm): 41 Weight (lbs): 158 Respiratory Rate (breaths/min): 18 Body Mass Index (BMI): 28.9 Blood Pressure (mmHg): 134/52 Reference Range: 80 - 120 mg / dl Electronic Signature(s) Signed: 04/13/2019 3:04:24 PM By: [name] Entered By: [name] on 01/18/2019 08:42:18

## 2019-04-13 NOTE — Progress Notes (Signed)
Tami, Landry ([id number]) Visit Report for 02/15/2019 Arrival Information Details Patient Name: Date of Service: [name], [name] 02/15/2019 10:00 AM Medical Record [id number] Patient Account Number: [id number] Date of Birth/Sex: Treating RN: Jun 05, 1930 (85 y.o. [name] Primary Care Lorrinda Ramstad: Salena Saner Other Clinician: Sandre Kitty Referring Beacher Every: Treating Zygmunt Mcglinn/Extender:Robson, Rhona Leavens, Doristine Locks in Treatment: 10 Visit Information History Since Last Visit Added or deleted any medications: No Patient Arrived: [name] Any new allergies or adverse reactions: No Arrival Time: 10:15 daughter Had a fall or experienced change in No Accompanied By: activities of daily living that may affect Transfer Assistance: None risk of falls: Patient Identification Verified: Yes Signs or symptoms of abuse/neglect since last No Secondary Verification Process Completed: Yes visito Patient Requires Transmission-Based No Hospitalized since last visit: No Precautions: Implantable device outside of the clinic excluding No Patient Has Alerts: No cellular tissue based products placed in the center since last visit: Has Dressing in Place as Prescribed: Yes Pain Present Now: No Electronic Signature(s) Signed: 04/13/2019 3:02:15 PM By: Sandre Kitty Entered By: Sandre Kitty on 02/15/2019 10:16:02 -------------------------------------------------------------------------------- Compression Therapy Details Patient Name: Date of Service: Tami Slade D. 02/15/2019 10:00 AM Medical Record [id number] Patient Account Number: [id number] Date of Birth/Sex: Treating RN: 01/10/1931 (85 y.o. [name] Primary Care Holle Sprick: Salena Saner Other Clinician: Sandre Kitty Referring Izak Anding: Treating Linsi Humann/Extender:Robson, Rhona Leavens, Doristine Locks in Treatment: 10 Compression Therapy Performed for Wound Wound #5  Right,Medial Lower Leg Assessment: Performed By: Clinician Kela Millin, RN Compression Type: Three Layer Post Procedure Diagnosis Same as Pre-procedure Electronic Signature(s) Signed: 02/15/2019 5:18:03 PM By: Kela Millin Entered By: Kela Millin on 02/15/2019 10:52:12 -------------------------------------------------------------------------------- Encounter Discharge Information Details Patient Name: Date of Service: Tami Slade D. 02/15/2019 10:00 AM Medical Record [id number] Patient Account Number: [id number] Date of Birth/Sex: Treating RN: 1931/02/16 (85 y.o. [name], [name] Primary Care Jailynne Opperman: Salena Saner Other Clinician: Sandre Kitty Referring Kaytlynne Neace: Treating Mekisha Bittel/Extender:Robson, Rhona Leavens, Doristine Locks in Treatment: 10 Encounter Discharge Information Items Discharge Condition: Stable Ambulatory Status: Ambulatory Discharge Destination: Home Transportation: Private Auto Accompanied By: self Schedule Follow-up Appointment: Yes Clinical Summary of Care: Electronic Signature(s) Signed: 02/15/2019 6:01:15 PM By: Deon Pilling Entered By: Deon Pilling on 02/15/2019 11:30:11 -------------------------------------------------------------------------------- Lower Extremity Assessment Details Patient Name: Date of Service: [name], [name] 02/15/2019 10:00 AM Medical Record [id number] Patient Account Number: [id number] Date of Birth/Sex: Treating RN: 30-Jan-1931 (85 y.o. [name], [name] Primary Care Rozelia Catapano: Salena Saner Other Clinician: Sandre Kitty Referring [name]: Treating Deunte Bledsoe/Extender:Robson, Rhona Leavens, Doristine Locks in Treatment: 10 Edema Assessment Assessed: [Left: No] [Right: Yes] Edema: [Left: Ye] [Right: s] Calf Left: Right: Point of Measurement: 35 cm From Medial Instep cm 31 cm Ankle Left: Right: Point of Measurement: 10 cm From Medial Instep cm 23 cm Vascular  Assessment Pulses: Dorsalis Pedis Palpable: [Right:Yes] Electronic Signature(s) Signed: 02/15/2019 6:01:15 PM By: Deon Pilling Entered By: Deon Pilling on 02/15/2019 10:36:50 -------------------------------------------------------------------------------- Multi Wound Chart Details Patient Name: Date of Service: Tami Slade D. 02/15/2019 10:00 AM Medical Record [id number] Patient Account Number: [id number] Date of Birth/Sex: Treating RN: [birth year] (85 y.o. [name] Primary Care Cache Decoursey: Salena Saner Other Clinician: Sandre Kitty Referring Randie Tallarico: Treating Lance Galas/Extender:Robson, Rhona Leavens, Doristine Locks in Treatment: 10 Vital Signs Height(in): 62 Pulse(bpm): 86 Weight(lbs): 158 Blood Pressure(mmHg): 139/54 Body Mass Index(BMI): 29 Temperature(F): 97.9 Respiratory 16 Rate(breaths/min): Photos: [5:No Photos] [N/A:N/A] Wound Location: [5:Right Lower Leg - Medial N/A] Wounding Event: [5:Gradually Appeared] [N/A:N/A]  Primary Etiology: [5:Vasculopathy] [N/A:N/A] Comorbid History: [5:Deep Vein Thrombosis, Hypertension] [N/A:N/A] Date Acquired: [5:11/04/2018] [N/A:N/A] Weeks of Treatment: [5:10] [N/A:N/A] Wound Status: [5:Open] [N/A:N/A] Measurements L x W x D 0.7x0.5x0.2 [N/A:N/A] (cm) Area (cm) : [5:0.275] [N/A:N/A] Volume (cm) : [5:0.055] [N/A:N/A] % Reduction in Area: [5:93.10%] [N/A:N/A] % Reduction in Volume: 93.10% [N/A:N/A] Classification: [5:Full Thickness Without Exposed Support Structures] [N/A:N/A] Exudate Amount: [5:Small] [N/A:N/A] Exudate Type: [5:Serosanguineous] [N/A:N/A] Exudate Color: [5:red, brown] [N/A:N/A] Wound Margin: [5:Distinct, outline attached] [N/A:N/A] Granulation Amount: [5:Large (67-100%)] [N/A:N/A] Granulation Quality: [5:Pink, Pale] [N/A:N/A] Necrotic Amount: [5:None Present (0%)] [N/A:N/A] Exposed Structures: [5:Fat Layer (Subcutaneous Tissue) Exposed: Yes Fascia: No Tendon: No Muscle: No Joint:  No Bone: No] [N/A:N/A] Epithelialization: [5:Medium (34-66%) Compression Therapy] [N/A:N/A N/A] Treatment Notes Wound #5 (Right, Medial Lower Leg) 1. Cleanse With Wound Cleanser Soap and water 2. Periwound Care Moisturizing lotion 3. Primary Dressing Applied Endoform 4. Secondary Dressing Dry Gauze 6. Support Layer Applied 3 layer compression wrap Notes netting. Electronic Signature(s) Signed: 02/15/2019 5:50:55 PM By: Linton Ham MD Signed: 02/16/2019 5:52:09 PM By: Levan Hurst RN, BSN Entered By: Linton Ham on 02/15/2019 12:46:30 -------------------------------------------------------------------------------- Multi-Disciplinary Care Plan Details Patient Name: Date of Service: Tami Slade D. 02/15/2019 10:00 AM Medical Record [id number] Patient Account Number: [id number] Date of Birth/Sex: Treating RN: 05/22/1930 (85 y.o. [name] Primary Care Abel Hageman: Salena Saner Other Clinician: Sandre Kitty Referring Victoria Henshaw: Treating Erial Fikes/Extender:Robson, Rhona Leavens, Doristine Locks in Treatment: 10 Active Inactive Wound/Skin Impairment Nursing Diagnoses: Impaired tissue integrity Knowledge deficit related to ulceration/compromised skin integrity Goals: Patient/caregiver will verbalize understanding of skin care regimen Date Initiated: 12/07/2018 Target Resolution Date: 03/05/2019 Goal Status: Active Ulcer/skin breakdown will have a volume reduction of 30% by week 4 Date Initiated: 12/07/2018 Date Inactivated: 01/18/2019 Target Resolution Date: 01/08/2019 Goal Status: Met Interventions: Assess patient/caregiver ability to obtain necessary supplies Assess patient/caregiver ability to perform ulcer/skin care regimen upon admission and as needed Assess ulceration(s) every visit Provide education on ulcer and skin care Notes: Electronic Signature(s) Signed: 02/15/2019 5:18:03 PM By: Kela Millin Entered By: Kela Millin  on 02/15/2019 10:39:12 -------------------------------------------------------------------------------- Pain Assessment Details Patient Name: Date of Service: [name], Tami D. 02/15/2019 10:00 AM Medical Record [id number] Patient Account Number: [id number] Date of Birth/Sex: Treating RN: 07-24-1930 (85 y.o. [name] Primary Care Gentri Guardado: Salena Saner Other Clinician: Sandre Kitty Referring Tonnie Friedel: Treating Abbie Berling/Extender:Robson, Rhona Leavens, Doristine Locks in Treatment: 10 Active Problems Location of Pain Severity and Description of Pain Patient Has Paino No Site Locations Pain Management and Medication Current Pain Management: Electronic Signature(s) Signed: 02/16/2019 5:52:09 PM By: Levan Hurst RN, BSN Signed: 04/13/2019 3:02:15 PM By: Sandre Kitty Entered By: Sandre Kitty on 02/15/2019 10:17:54 -------------------------------------------------------------------------------- Patient/Caregiver Education Details Patient Name: Date of Service: [name] 10/12/2020andnbsp10:00 AM Medical Record [id number] Patient Account Number: [id number] Date of Birth/Gender: Treating RN: Jun 02, 1930 (85 y.o. [name] Primary Care Physician: Salena Saner Other Clinician: Sandre Kitty Referring Physician: Treating Physician/Extender:Robson, Rhona Leavens, Doristine Locks in Treatment: 10 Education Assessment Education Provided To: Patient Education Topics Provided Wound/Skin Impairment: Methods: Explain/Verbal Responses: State content correctly Electronic Signature(s) Signed: 02/15/2019 5:18:03 PM By: Kela Millin Entered By: Kela Millin on 02/15/2019 10:39:24 -------------------------------------------------------------------------------- Wound Assessment Details Patient Name: Date of Service: Tami Slade D. 02/15/2019 10:00 AM Medical Record [id number] Patient Account Number:  [id number] Date of Birth/Sex: Treating RN: 05-Jun-1930 (85 y.o. [name] Primary Care Laurieann Friddle: Salena Saner Other Clinician: Sandre Kitty Referring Shedric Fredericks: Treating Laprecious Austill/Extender:Robson, Rhona Leavens, Royetta Crochet  Weeks in Treatment: 10 Wound Status Wound Number: 5 Primary Etiology: Vasculopathy Wound Location: Right Lower Leg - Medial Wound Status: Open Wounding Event: Gradually Appeared Comorbid History: Deep Vein Thrombosis, Hypertension Date Acquired: 11/04/2018 Weeks Of Treatment: 10 Clustered Wound: No Photos Wound Measurements Length: (cm) 0.7 % Redu Width: (cm) 0.5 % Redu Depth: (cm) 0.2 Epithe Area: (cm) 0.275 Tunne Volume: (cm) 0.055 Under Wound Description Full Thickness Without Exposed Support Foul O Classification: Structures Slough Wound Distinct, outline attached Margin: Exudate Small Amount: Exudate Serosanguineous Type: Exudate red, brown Color: Wound Bed Granulation Amount: Large (67-100%) Granulation Quality: Pink, Pale Fascia Necrotic Amount: None Present (0%) Fat La Tendon Muscle Joint Bone E Electronic Signature(s) Signed: 02/16/2019 5:52:09 PM By: Levan Hurst RN, BSN Signed: 03/10/2019 4:01:42 PM By: Mikeal Hawthorne EMT/HBOT Previous Signature: 02/15/2019 6:01:15 PM Version By: Geronimo Boot Entered By: Mikeal Hawthorne on 10/13 dor After Cleansing: No Lynita Lombard No Exposed Structure Exposed: No yer (Subcutaneous Tissue) Exposed: Yes Exposed: No Exposed: No Exposed: No xposed: No bi /2020 11:06:44 ction in Area: 93.1% ction in Volume: 93.1% lialization: Medium (34-66%) ling: No mining: No -------------------------------------------------------------------------------- Vitals Details Patient Name: Date of Service: [name], [name] 02/15/2019 10:00 AM Medical Record [id number] Patient Account Number: [id number] Date of Birth/Sex: Treating RN: [birth year] (85 y.o. [name] Primary Care  Dessa Ledee: Salena Saner Other Clinician: Sandre Kitty Referring Estle Huguley: Treating Rexann Lueras/Extender:Robson, Rhona Leavens, Doristine Locks in Treatment: 10 Vital Signs Time Taken: 10:17 Temperature (F): 97.9 Height (in): 62 Pulse (bpm): 53 Weight (lbs): 158 Respiratory Rate (breaths/min): 16 Body Mass Index (BMI): 28.9 Blood Pressure (mmHg): 139/54 Reference Range: 80 - 120 mg / dl Electronic Signature(s) Signed: 04/13/2019 3:02:15 PM By: Sandre Kitty Entered By: Sandre Kitty on 02/15/2019 10:17:45

## 2019-04-14 NOTE — Progress Notes (Signed)
Landry, Tami ([id number]) Visit Report for 04/12/2019 Arrival Information Details Patient Name: Date of Service: [name], [name] 04/12/2019 9:15 AM Medical Record [id number] Patient Account Number: [id number] Date of Birth/Sex: Treating RN: [birth year] (85 y.o. [name], [name] Primary Care [name]: Salena Saner Other Clinician: Referring Keilyn Haggard: Treating Garnet Overfield/Extender:Robson, Rhona Leavens, Doristine Locks in Treatment: 18 Visit Information History Since Last Visit Cane Added or deleted any medications: No Patient Arrived: 09:59 Any new allergies or adverse reactions: No Arrival Time: Had a fall or experienced change in No Accompanied By: self None activities of daily living that may affect Transfer Assistance: risk of falls: Patient Identification Verified: Yes Signs or symptoms of abuse/neglect since last No Secondary Verification Process Completed: Yes visito Patient Requires Transmission-Based No Hospitalized since last visit: No Precautions: Implantable device outside of the clinic excluding No Patient Has Alerts: No cellular tissue based products placed in the center since last visit: Has Dressing in Place as Prescribed: Yes Has Compression in Place as Prescribed: Yes Pain Present Now: No Electronic Signature(s) Signed: 04/14/2019 12:04:56 PM By: Kela Millin Entered By: Kela Millin on 04/12/2019 10:03:34 -------------------------------------------------------------------------------- Compression Therapy Details Patient Name: Date of Service: Tami Slade D. 04/12/2019 9:15 AM Medical Record [id number] Patient Account Number: [id number] Date of Birth/Sex: Treating RN: Dec 01, 1930 (85 y.o. [name] Primary Care Toni Demo: Salena Saner Other Clinician: Referring Kona Yusuf: Treating Moosa Bueche/Extender:Robson, Rhona Leavens, Doristine Locks in Treatment: 18 Compression Therapy Performed for Wound Wound #5  Right,Medial Lower Leg Assessment: Performed By: Clinician Levan Hurst, RN Compression Type: Three Layer Post Procedure Diagnosis Same as Pre-procedure Electronic Signature(s) Signed: 04/12/2019 5:51:44 PM By: Levan Hurst RN, BSN Entered By: Levan Hurst on 04/12/2019 10:32:33 -------------------------------------------------------------------------------- Encounter Discharge Information Details Patient Name: Date of Service: Tami Slade D. 04/12/2019 9:15 AM Medical Record [id number] Patient Account Number: [id number] Date of Birth/Sex: Treating RN: 12/13/1930 (85 y.o. [name] Primary Care Silveria Botz: Salena Saner Other Clinician: Referring Aviyanna Colbaugh: Treating Tawna Alwin/Extender:Robson, Rhona Leavens, Doristine Locks in Treatment: 18 Encounter Discharge Information Items Post Procedure Vitals Discharge Condition: Stable Temperature (F): 97.5 Ambulatory Status: Cane Pulse (bpm): 41 Discharge Destination: Home Respiratory Rate (breaths/min): 18 Transportation: Private Auto Blood Pressure (mmHg): 115/46 Accompanied By: daughter Schedule Follow-up Appointment: Yes Clinical Summary of Care: Patient Declined Electronic Signature(s) Signed: 04/14/2019 12:04:56 PM By: Kela Millin Entered By: Kela Millin on 04/12/2019 10:50:11 -------------------------------------------------------------------------------- Lower Extremity Assessment Details Patient Name: Date of Service: [name], [name] 04/12/2019 9:15 AM Medical Record [id number] Patient Account Number: [id number] Date of Birth/Sex: Treating RN: 08/21/1930 (85 y.o. [name] Primary Care Ariba Lehnen: Salena Saner Other Clinician: Referring Nahshon Reich: Treating Domnick Chervenak/Extender:Robson, Rhona Leavens, Doristine Locks in Treatment: 18 Edema Assessment Assessed: [Left: No] [Right: No] Edema: [Left: Ye] [Right: s] Calf Left: Right: Point of Measurement: 35 cm From  Medial Instep cm 32.5 cm Ankle Left: Right: Point of Measurement: 10 cm From Medial Instep cm 23 cm Vascular Assessment Pulses: Dorsalis Pedis Palpable: [Right:Yes] Electronic Signature(s) Signed: 04/14/2019 12:04:56 PM By: Kela Millin Entered By: Kela Millin on 04/12/2019 10:13:22 -------------------------------------------------------------------------------- Multi Wound Chart Details Patient Name: Date of Service: Tami Slade D. 04/12/2019 9:15 AM Medical Record [id number] Patient Account Number: [id number] Date of Birth/Sex: Treating RN: [birth year] (85 y.o. F) Primary Care Kery Haltiwanger: Salena Saner Other Clinician: Referring Ayasha Ellingsen: Treating Grenda Lora/Extender:Robson, Rhona Leavens, Doristine Locks in Treatment: 18 Vital Signs Height(in): 62 Pulse(bpm): 41 Weight(lbs): 158 Blood Pressure(mmHg): 115/46 Body Mass Index(BMI): 29 Temperature(F): 97.5 Respiratory 18 Rate(breaths/min): Photos: [  5:No Photos] [N/A:N/A] Wound Location: [5:Right Lower Leg - Medial] [N/A:N/A] Wounding Event: [5:Gradually Appeared] [N/A:N/A] Primary Etiology: [5:Vasculopathy] [N/A:N/A] Comorbid History: [5:Deep Vein Thrombosis, Hypertension] [N/A:N/A] Date Acquired: [5:11/04/2018] [N/A:N/A] Weeks of Treatment: [5:18] [N/A:N/A] Wound Status: [5:Open] [N/A:N/A] Measurements L x W x D 0.7x0.5x0.5 [N/A:N/A] (cm) Area (cm) : [5:0.275] [N/A:N/A] Volume (cm) : [5:0.137] [N/A:N/A] % Reduction in Area: [5:93.10%] [N/A:N/A] % Reduction in Volume: 82.90% [N/A:N/A] Classification: [5:Full Thickness Without Exposed Support Structures] [N/A:N/A] Exudate Amount: [5:Small] [N/A:N/A N/A] Exudate Type: [5:Serosanguineous] [N/A:N/A N/A] Exudate Color: [5:red, brown] [N/A:N/A N/A] Wound Margin: [5:Well defined, not attached N/A] [N/A:N/A] Granulation Amount: [5:Medium (34-66%)] [N/A:N/A N/A] Granulation Quality: [5:Pink, Pale] [N/A:N/A N/A] Necrotic Amount: [5:Small (1-33%)]  [N/A:N/A N/A] Exposed Structures: [5:Fat Layer (Subcutaneous N/A Tissue) Exposed: Yes Fascia: No Tendon: No Muscle: No Joint: No Bone: No] [N/A:N/A] Epithelialization: [5:Medium (34-66%)] [N/A:N/A N/A] Debridement: [5:Debridement - Excisional N/A] [N/A:N/A] Pre-procedure [5:10:30] [N/A:N/A N/A] Verification/Time Out Taken: Pain Control: [5:Lidocaine 5% topical ointment] [N/A:N/A N/A] Tissue Debrided: [5:Subcutaneous, Slough] [N/A:N/A N/A] Level: [5:Skin/Subcutaneous Tissue] [N/A:N/A N/A] Debridement Area (sq cm):0.35 [N/A:N/A N/A] Instrument: [5:Curette] [N/A:N/A N/A] Bleeding: [5:Minimum] [N/A:N/A N/A] Hemostasis Achieved: [5:Pressure] [N/A:N/A N/A] Procedural Pain: [5:0] [N/A:N/A N/A] Post Procedural Pain: [5:0] [N/A:N/A N/A] Debridement Treatment Procedure was tolerated [N/A:N/A N/A] Response: [5:well] Post Debridement [5:0.7x0.5x0.5] [N/A:N/A N/A] Measurements L x W x D (cm) Post Debridement [5:0.137] [N/A:N/A N/A] Volume: (cm) Procedures Performed: Compression Therapy [5:Debridement] [N/A:N/A N/A] Treatment Notes Wound #5 (Right, Medial Lower Leg) 1. Cleanse With Wound Cleanser Soap and water 2. Periwound Care Moisturizing lotion 3. Primary Dressing Applied Polymem Ag 4. Secondary Dressing Dry Gauze 6. Support Layer Applied 3 layer compression wrap Notes stretch net Electronic Signature(s) Signed: 04/13/2019 10:01:17 AM By: Linton Ham MD Entered By: Linton Ham on 04/12/2019 11:03:36 -------------------------------------------------------------------------------- Multi-Disciplinary Care Plan Details Patient Name: Date of Service: [name], [name] 04/12/2019 9:15 AM Medical Record [id number] Patient Account Number: [id number] Date of Birth/Sex: Treating RN: 10-09-1930 (85 y.o. [name] Primary Care Krislyn Donnan: Salena Saner Other Clinician: Referring Yolanda Dockendorf: Treating Earl Losee/Extender:Robson, Rhona Leavens, Doristine Locks in  Treatment: 18 Active Inactive Wound/Skin Impairment Nursing Diagnoses: Impaired tissue integrity Knowledge deficit related to ulceration/compromised skin integrity Goals: Patient/caregiver will verbalize understanding of skin care regimen Date Initiated: 12/07/2018 Target Resolution Date: 05/07/2019 Goal Status: Active Ulcer/skin breakdown will have a volume reduction of 30% by week 4 Date Initiated: 12/07/2018 Date Inactivated: 01/18/2019 Target Resolution Date: 01/08/2019 Goal Status: Met Interventions: Assess patient/caregiver ability to obtain necessary supplies Assess patient/caregiver ability to perform ulcer/skin care regimen upon admission and as needed Assess ulceration(s) every visit Provide education on ulcer and skin care Notes: Electronic Signature(s) Signed: 04/12/2019 5:51:44 PM By: Levan Hurst RN, BSN Entered By: Levan Hurst on 04/12/2019 10:29:36 -------------------------------------------------------------------------------- Pain Assessment Details Patient Name: Date of Service: Tami Slade D. 04/12/2019 9:15 AM Medical Record [id number] Patient Account Number: [id number] Date of Birth/Sex: Treating RN: July 02, 1930 (85 y.o. [name] Primary Care Willies Laviolette: Other Clinician: Salena Saner Referring Narcissa Melder: Treating Harlem Thresher/Extender:Robson, Rhona Leavens, Doristine Locks in Treatment: 18 Active Problems Location of Pain Severity and Description of Pain Patient Has Paino No Site Locations Pain Management and Medication Current Pain Management: Electronic Signature(s) Signed: 04/14/2019 12:04:56 PM By: Kela Millin Entered By: Kela Millin on 04/12/2019 10:05:16 -------------------------------------------------------------------------------- Patient/Caregiver Education Details Patient Name: Date of Service: [name] 12/7/2020andnbsp9:15 AM Medical Record [id number] Patient Account Number: [id number] Date of  Birth/Gender: Treating RN: 11-15-1930 (85 y.o. [name] Primary Care Physician: Willey Blade  R Other Clinician: Referring Physician: Treating Physician/Extender:Robson, Rhona Leavens, Doristine Locks in Treatment: 18 Education Assessment Education Provided To: Patient Education Topics Provided Wound/Skin Impairment: Methods: Explain/Verbal Responses: State content correctly [employer]) Signed: 04/12/2019 5:51:44 PM By: Levan Hurst RN, BSN Entered By: Levan Hurst on 04/12/2019 10:30:29 -------------------------------------------------------------------------------- Wound Assessment Details Patient Name: Date of Service: [name], [name] 04/12/2019 9:15 AM Medical Record [id number] Patient Account Number: [id number] Date of Birth/Sex: Treating RN: [birth year] (85 y.o. [name] Primary Care Alda Gaultney: Salena Saner Other Clinician: Referring Tiera Mensinger: Treating Brenn Deziel/Extender:Robson, Rhona Leavens, Doristine Locks in Treatment: 18 Wound Status Wound Number: 5 Primary Etiology: Vasculopathy Wound Location: Right Lower Leg - Medial Wound Status: Open Wounding Event: Gradually Appeared Comorbid History: Deep Vein Thrombosis, Hypertension Date Acquired: 11/04/2018 Weeks Of Treatment: 18 Clustered Wound: No Photos Wound Measurements Length: (cm) 0.7 % Reduct Width: (cm) 0.5 % Reduct Depth: (cm) 0.5 Epitheli Area: (cm) 0.275 Tunneli Volume: (cm) 0.137 Undermi Wound Description Full Thickness Without Exposed Support Foul Od Classification: Structures Slough/ Wound Well defined, not attached Margin: Exudate Small Amount: Exudate Serosanguineous Type: Exudate red, brown Color: Wound Bed Granulation Amount: Medium (34-66%) Granulation Quality: Pink, Pale Fascia Necrotic Amount: Small (1-33%) Fat Layer Necrotic Quality: Adherent Slough Tendon Exp Muscle Exp Joint Expo Bone Expos or After Cleansing:  No Fibrino Yes Exposed Structure Exposed: No (Subcutaneous Tissue) Exposed: Yes osed: No osed: No sed: No ed: No ion in Area: 93.1% ion in Volume: 82.9% alization: Medium (34-66%) ng: No ning: No Treatment Notes Wound #5 (Right, Medial Lower Leg) 1. Cleanse With Wound Cleanser Soap and water 2. Periwound Care Moisturizing lotion 3. Primary Dressing Applied Polymem Ag 4. Secondary Dressing Dry Gauze 6. Support Layer Applied 3 layer compression wrap Notes stretch net Electronic Signature(s) Signed: 04/13/2019 4:34:42 PM By: Mikeal Hawthorne EMT/HBOT Signed: 04/14/2019 12:04:56 PM By: Kela Millin Entered By: Mikeal Hawthorne on 04/13/2019 13:53:55 -------------------------------------------------------------------------------- Vitals Details Patient Name: Date of Service: Tami Slade D. 04/12/2019 9:15 AM Medical Record [id number] Patient Account Number: [id number] Date of Birth/Sex: Treating RN: [birth year] (85 y.o. [name] Primary Care Larin Depaoli: Salena Saner Other Clinician: Referring Kanyon Bunn: Treating Jontrell Bushong/Extender:Robson, Rhona Leavens, Doristine Locks in Treatment: 18 Vital Signs Time Taken: 10:00 Temperature (F): 97.5 Height (in): 62 Pulse (bpm): 41 Weight (lbs): 158 Respiratory Rate (breaths/min): 18 Body Mass Index (BMI): 28.9 Blood Pressure (mmHg): 115/46 Reference Range: 80 - 120 mg / dl Electronic Signature(s) Signed: 04/14/2019 12:04:56 PM By: Kela Millin Entered By: Kela Millin on 04/12/2019 10:04:16

## 2019-04-19 ENCOUNTER — Other Ambulatory Visit: Payer: Self-pay

## 2019-04-19 ENCOUNTER — Encounter (HOSPITAL_BASED_OUTPATIENT_CLINIC_OR_DEPARTMENT_OTHER): Payer: Medicare Other | Admitting: Internal Medicine

## 2019-04-19 DIAGNOSIS — L97312 Non-pressure chronic ulcer of right ankle with fat layer exposed: Secondary | ICD-10-CM | POA: Diagnosis not present

## 2019-04-19 NOTE — Progress Notes (Addendum)
MARIUM, RAGAN ([id number]) Visit Report for 04/19/2019 Arrival Information Details Patient Name: Date of Service: GENINE, BECKETT 04/19/2019 9:15 AM Medical Record [id number] Patient Account Number: [id number] Date of Birth/Sex: Treating RN: 1931-03-23 (85 y.o. [name], Meta.Reding Primary Care Grainne Knights: Salena Saner Other Clinician: Referring Tinzlee Craker: Treating Pearlene Teat/Extender:Robson, Rhona Leavens, Doristine Locks in Treatment: 19 Visit Information History Since Last Visit Cane Added or deleted any medications: No Patient Arrived: 09:40 Any new allergies or adverse reactions: No Arrival Time: Had a fall or experienced change in No Accompanied By: self None activities of daily living that may affect Transfer Assistance: risk of falls: Patient Identification Verified: Yes Signs or symptoms of abuse/neglect since last No Secondary Verification Process Completed: Yes visito Patient Requires Transmission-Based No Hospitalized since last visit: No Precautions: Implantable device outside of the clinic excluding No Patient Has Alerts: No cellular tissue based products placed in the center since last visit: Has Dressing in Place as Prescribed: Yes Has Compression in Place as Prescribed: Yes Pain Present Now: No Electronic Signature(s) Signed: 04/19/2019 5:58:58 PM By: Deon Pilling Entered By: Deon Pilling on 04/19/2019 09:42:05 -------------------------------------------------------------------------------- Compression Therapy Details Patient Name: Date of Service: DINEEN, CONRADT 04/19/2019 9:15 AM Medical Record [id number] Patient Account Number: [id number] Date of Birth/Sex: Treating RN: 11-05-1930 (85 y.o. [name] Primary Care Ravinder Hofland: Salena Saner Other Clinician: Referring Shakelia Scrivner: Treating Dior Dominik/Extender:Robson, Rhona Leavens, Doristine Locks in Treatment: 19 Compression Therapy Performed for Wound Wound #5 Right,Medial  Lower Leg Assessment: Performed By: Clinician Levan Hurst, RN Compression Type: Three Layer Post Procedure Diagnosis Same as Pre-procedure Electronic Signature(s) Signed: 04/19/2019 6:02:21 PM By: Levan Hurst RN, BSN Entered By: Levan Hurst on 04/19/2019 10:07:12 -------------------------------------------------------------------------------- Encounter Discharge Information Details Patient Name: Date of Service: [name] 04/19/2019 9:15 AM Medical Record [id number] Patient Account Number: [id number] Date of Birth/Sex: Treating RN: 1930/10/28 (85 y.o. [name] Primary Care Shakhia Gramajo: Salena Saner Other Clinician: Referring Makana Feigel: Treating Esly Selvage/Extender:Robson, Rhona Leavens, Doristine Locks in Treatment: 19 Encounter Discharge Information Items Post Procedure Vitals Discharge Condition: Stable Temperature (F): 98.4 Ambulatory Status: Cane Pulse (bpm): 41 Discharge Destination: Home Respiratory Rate (breaths/min): 20 Transportation: Private Auto Blood Pressure (mmHg): 135/47 Accompanied By: daughter Schedule Follow-up Appointment: Yes Clinical Summary of Care: Electronic Signature(s) Signed: 04/19/2019 5:58:58 PM By: Deon Pilling Entered By: Deon Pilling on 04/19/2019 10:26:49 -------------------------------------------------------------------------------- Lower Extremity Assessment Details Patient Name: Date of Service: AEON, KOORS 04/19/2019 9:15 AM Medical Record [id number] Patient Account Number: [id number] Date of Birth/Sex: Treating RN: June 28, 1930 (85 y.o. [name], [name] Primary Care Keny Donald: Salena Saner Other Clinician: Referring Khiem Gargis: Treating Ayannah Faddis/Extender:Robson, Rhona Leavens, Doristine Locks in Treatment: 19 Edema Assessment Assessed: [Left: No] [Right: Yes] Edema: [Left: Ye] [Right: s] Calf Left: Right: Point of Measurement: 35 cm From Medial Instep cm 31.5 cm Ankle Left:  Right: Point of Measurement: 10 cm From Medial Instep cm 21 cm Vascular Assessment Pulses: Dorsalis Pedis Palpable: [Right:Yes] Electronic Signature(s) Signed: 04/19/2019 5:58:58 PM By: Deon Pilling Entered By: Deon Pilling on 04/19/2019 09:46:42 -------------------------------------------------------------------------------- Multi Wound Chart Details Patient Name: Date of Service: [name] 04/19/2019 9:15 AM Medical Record [id number] Patient Account Number: [id number] Date of Birth/Sex: Treating RN: 14-Jun-1930 (85 y.o. [name] Primary Care Chamari Cutbirth: Salena Saner Other Clinician: Referring Jael Kostick: Treating Silviano Neuser/Extender:Robson, Rhona Leavens, Doristine Locks in Treatment: 19 Vital Signs Height(in): 62 Pulse(bpm): 41 Weight(lbs): 158 Blood Pressure(mmHg): 135/47 Body Mass Index(BMI): 29 Temperature(F): 98.4 Respiratory 18 Rate(breaths/min): Photos: [  5:No Photos] [N/A:N/A] Wound Location: [5:Right Lower Leg - Medial] [N/A:N/A] Wounding Event: [5:Gradually Appeared] [N/A:N/A] Primary Etiology: [5:Vasculopathy] [N/A:N/A] Comorbid History: [5:Deep Vein Thrombosis, Hypertension] [N/A:N/A] Date Acquired: [5:11/04/2018] [N/A:N/A] Weeks of Treatment: [5:19] [N/A:N/A] Wound Status: [5:Open] [N/A:N/A] Measurements L x W x D 0.4x0.5x0.2 [N/A:N/A] (cm) Area (cm) : [5:0.157] [N/A:N/A] Volume (cm) : [5:0.031] [N/A:N/A] % Reduction in Area: [5:96.10%] [N/A:N/A] % Reduction in Volume: 96.10% [N/A:N/A] Classification: [5:Full Thickness Without Exposed Support Structures] [N/A:N/A] Exudate Amount: [5:Small] [N/A:N/A] Exudate Type: [5:Serosanguineous] [N/A:N/A] Exudate Color: [5:red, brown] [N/A:N/A] Wound Margin: [5:Well defined, not attached N/A] Granulation Amount: [5:Large (67-100%)] [N/A:N/A] Granulation Quality: [5:Pink, Pale] [N/A:N/A] Necrotic Amount: [5:Small (1-33%)] [N/A:N/A] Exposed Structures: [5:Fat Layer (Subcutaneous N/A  Tissue) Exposed: Yes Fascia: No Tendon: No Muscle: No Joint: No Bone: No] Epithelialization: [5:Large (67-100%)] [N/A:N/A] Debridement: [5:Debridement - Selective/Open Wound] [N/A:N/A] Pre-procedure [5:10:04] [N/A:N/A] Verification/Time Out Taken: Level: [5:Skin/Epidermis] [N/A:N/A] Debridement Area (sq cm):0.2 [N/A:N/A] Instrument: [5:Curette] [N/A:N/A] Bleeding: [5:None] [N/A:N/A] Procedural Pain: [5:0] [N/A:N/A] Post Procedural Pain: [5:0] [N/A:N/A] Debridement Treatment Procedure was tolerated [N/A:N/A] Response: [5:well] Post Debridement [5:0.4x0.5x0.2] [N/A:N/A] Measurements L x W x D (cm) Post Debridement [5:0.031] [N/A:N/A] Volume: (cm) Procedures Performed: Compression Therapy [5:Debridement] [N/A:N/A] Treatment Notes Wound #5 (Right, Medial Lower Leg) 1. Cleanse With Wound Cleanser Soap and water 2. Periwound Care Moisturizing lotion 3. Primary Dressing Applied Polymem Ag 4. Secondary Dressing Dry Gauze 6. Support Layer Applied 3 layer compression wrap Notes stretch net. Electronic Signature(s) Signed: 04/19/2019 5:56:50 PM By: Linton Ham MD Signed: 04/19/2019 6:02:21 PM By: Levan Hurst RN, BSN Entered By: Linton Ham on 04/19/2019 10:52:43 -------------------------------------------------------------------------------- Multi-Disciplinary Care Plan Details Patient Name: Date of Service: KEAJAH, KILLOUGH 04/19/2019 9:15 AM Medical Record [id number] Patient Account Number: [id number] Date of Birth/Sex: Treating RN: [birth year] (85 y.o. [name] Primary Care Jakara Blatter: Salena Saner Other Clinician: Referring Bridgitte Felicetti: Treating Donta Fuster/Extender:Robson, Rhona Leavens, Doristine Locks in Treatment: 19 Active Inactive Wound/Skin Impairment Nursing Diagnoses: Impaired tissue integrity Knowledge deficit related to ulceration/compromised skin integrity Goals: Patient/caregiver will verbalize understanding of skin care  regimen Date Initiated: 12/07/2018 Target Resolution Date: 05/07/2019 Goal Status: Active Ulcer/skin breakdown will have a volume reduction of 30% by week 4 Date Initiated: 12/07/2018 Date Inactivated: 01/18/2019 Target Resolution Date: 01/08/2019 Goal Status: Met Interventions: Assess patient/caregiver ability to obtain necessary supplies Assess patient/caregiver ability to perform ulcer/skin care regimen upon admission and as needed Assess ulceration(s) every visit Provide education on ulcer and skin care Notes: Electronic Signature(s) Signed: 04/19/2019 6:02:21 PM By: Levan Hurst RN, BSN Entered By: Levan Hurst on 04/19/2019 15:25:48 -------------------------------------------------------------------------------- Pain Assessment Details Patient Name: Date of Service: [name] 04/19/2019 9:15 AM Medical Record [id number] Patient Account Number: [id number] Date of Birth/Sex: Treating RN: 05/04/1931 (85 y.o. [name] Primary Care Keo Schirmer: Salena Saner Other Clinician: Referring Elvenia Godden: Treating Yandel Zeiner/Extender:Robson, Rhona Leavens, Doristine Locks in Treatment: 19 Active Problems Location of Pain Severity and Description of Pain Patient Has Paino No Site Locations Rate the pain. Current Pain Level: 0 Pain Management and Medication Current Pain Management: Medication: No Cold Application: No Rest: No Massage: No Activity: No T.E.N.S.: No Heat Application: No Leg drop or elevation: No Is the Current Pain Management Adequate: Adequate How does your wound impact your activities of daily livingo Sleep: No Bathing: No Appetite: No Relationship With Others: No Bladder Continence: No Emotions: No Bowel Continence: No Work: No Toileting: No Drive: No Dressing: No Hobbies: No Electronic Signature(s) Signed: 04/19/2019 5:58:58 PM By: Deon Pilling Entered By: Rolin Barry  Bobbi on 04/19/2019  09:43:41 -------------------------------------------------------------------------------- Patient/Caregiver Education Details Patient Name: Date of Service: LIBI, CORSO 12/14/2020andnbsp9:15 AM Medical Record [name] Patient Account Number: [id number] Date of Birth/Gender: Treating RN: 1930-06-25 (85 y.o. [name] Primary Care Physician: Salena Saner Other Clinician: Referring Physician: Treating Physician/Extender:Robson, Rhona Leavens, Doristine Locks in Treatment: 19 Education Assessment Education Provided To: Patient Education Topics Provided Wound/Skin Impairment: Methods: Explain/Verbal Responses: State content correctly [employer]) Signed: 04/19/2019 6:02:21 PM By: Levan Hurst RN, BSN Entered By: Levan Hurst on 04/19/2019 15:26:00 -------------------------------------------------------------------------------- Wound Assessment Details Patient Name: Date of Service: YOHANNA, TOW 04/19/2019 9:15 AM Medical Record [id number] Patient Account Number: [id number] Date of Birth/Sex: Treating RN: 1931-04-03 (84 y.o. [name], Meta.Reding Primary Care Mohannad Olivero: Salena Saner Other Clinician: Referring Estes Lehner: Treating Cattie Tineo/Extender:Robson, Rhona Leavens, Doristine Locks in Treatment: 19 Wound Status Wound Number: 5 Primary Etiology: Vasculopathy Wound Location: Right Lower Leg - Medial Wound Status: Open Wounding Event: Gradually Appeared Comorbid History: Deep Vein Thrombosis, Hypertension Date Acquired: 11/04/2018 Weeks Of Treatment: 19 Clustered Wound: No Photos Wound Measurements Length: (cm) 0.4 % Reduc Width: (cm) 0.5 % Reduc Depth: (cm) 0.2 Epithel Area: (cm) 0.157 Tunnel Volume: (cm) 0.031 Underm Wound Description Full Thickness Without Exposed Support Foul O Classification: Structures Slough Wound Wound Well defined, not attached Margin: Exudate Small Amount: Exudate  Serosanguineous Type: Exudate red, brown Color: Wound Bed Granulation Amount: Large (67-100%) Granulation Quality: Pink, Pale Fascia Ex Necrotic Amount: Small (1-33%) Fat Layer Necrotic Quality: Adherent Slough Tendon Ex Muscle Ex Joint Exp Bone Expo dor After Cleansing: No /Fibrino Yes Exposed Structure posed: No (Subcutaneous Tissue) Exposed: Yes posed: No posed: No osed: No sed: No tion in Area: 96.1% tion in Volume: 96.1% ialization: Large (67-100%) ing: No ining: No Treatment Notes Wound #5 (Right, Medial Lower Leg) 1. Cleanse With Wound Cleanser Soap and water 2. Periwound Care Moisturizing lotion 3. Primary Dressing Applied Polymem Ag 4. Secondary Dressing Dry Gauze 6. Support Layer Applied 3 layer compression wrap Notes stretch net. Electronic Signature(s) Signed: 04/20/2019 1:45:50 PM By: Deon Pilling Signed: 04/20/2019 3:57:04 PM By: Mikeal Hawthorne EMT/HBOT Previous Signature: 04/19/2019 5:58:58 PM Version By: Deon Pilling Entered By: Mikeal Hawthorne on 04/20/2019 12:10:44 -------------------------------------------------------------------------------- Vitals Details Patient Name: Date of Service: [name] 04/19/2019 9:15 AM Medical Record [id number] Patient Account Number: [id number] Date of Birth/Sex: Treating RN: 1930-10-15 (85 y.o. [name], Meta.Reding Primary Care Rasheen Bells: Salena Saner Other Clinician: Referring Kin Galbraith: Treating Shriley Joffe/Extender:Robson, Rhona Leavens, Doristine Locks in Treatment: 19 Vital Signs Time Taken: 09:48 Temperature (F): 98.4 Height (in): 62 Pulse (bpm): 41 Weight (lbs): 158 Respiratory Rate (breaths/min): 18 Body Mass Index (BMI): 28.9 Blood Pressure (mmHg): 135/47 Reference Range: 80 - 120 mg / dl Electronic Signature(s) Signed: 04/19/2019 5:58:58 PM By: Deon Pilling Entered By: Deon Pilling on 04/19/2019 09:48:52

## 2019-04-19 NOTE — Progress Notes (Signed)
[name], [name] ([id number]) Visit Report for 04/19/2019 Debridement Details Patient Name: Date of Service: [name], Tami D. 04/19/2019 9:15 AM Medical Record [name] Patient Account Number: [id number] Date of Birth/Sex: Treating RN: 04/16/1931 (85 y.o. [name]) Landry, Tami Primary Care Provider: Alva GarnetShelton, Tami R Other Clinician: Referring Provider: Treating Provider/Extender:[name], Tami BrooksMichael Landry, [name] in Treatment: 19 Debridement Performed for Wound #5 Right,Medial Lower Leg Assessment: Performed By: Physician Tami Caulobson, Hadja Harral G., MD Debridement Type: Debridement Level of Consciousness (Pre- Awake and Alert procedure): Pre-procedure Yes - 10:04 Verification/Time Out Taken: Start Time: 10:04 Total Area Debrided (L x W): 0.4 (cm) x 0.5 (cm) = 0.2 (cm) Tissue and other material Non-Viable, Skin: Epidermis, Biofilm debrided: Level: Skin/Epidermis Debridement Description: Selective/Open Wound Instrument: Curette Bleeding: None End Time: 10:05 Procedural Pain: 0 Post Procedural Pain: 0 Response to Treatment: Procedure was tolerated well Level of Consciousness Awake and Alert (Post-procedure): Post Debridement Measurements of Total Wound Length: (cm) 0.4 Width: (cm) 0.5 Depth: (cm) 0.2 Volume: (cm) 0.031 Character of Wound/Ulcer Post Improved Debridement: Post Procedure Diagnosis Same as Pre-procedure Electronic Signature(s) Signed: 04/19/2019 5:56:50 PM By: Baltazar Najjarobson, Hutton Pellicane MD Signed: 04/19/2019 6:02:21 PM By: Zandra AbtsLynch, Shatara RN, BSN Entered By: Baltazar Najjarobson, Naama Sappington on 04/19/2019 10:54:29 -------------------------------------------------------------------------------- HPI Details Patient Name: Date of Service: [name], Tami D. 04/19/2019 9:15 AM Medical Record [id number] Patient Account Number: [id number] Date of Birth/Sex: Treating RN: 07/07/1930 (85 y.o. 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 Patient was given a course of doxycycline for 7 days that she completed, has been putting mupirocin cream on the wounds, leaving it exposed at night and covering with dry gauze during the day. Overall patient who is present with her daughter in the clinic today states that the 2 wounds have been improving to the present size. There is also some seepage in and around the wound area. Both legs are swollen. No changes to dimensions of legs overall in the recent past. Patient was remotely seen here in the clinic in 2014 for right lower leg tibial wounds very similar to the ones were seeing today. 8/10-Patient returns after being initiated in the wound clinic last week. The right medial leg wounds about the right ankle are both slightly smaller in size, not much seepage, legs are not as swollen we are using a 3 layer compression of the right juxta lite on the left she denies any significant pain 8/17; the patient has 2 small open areas on the medial right leg. She has good edema control. There is no doubt she has underlying lymphedema. Still a lot of adherent debris to both wounds 8/24; same 2 small open areas on the medial right leg. Edema control. We have been using Iodoflex since last week and the surface of the wound looks some better. 9/14; 2 small openings on the right medial lower leg. The patient went for her venous reflux studies in [place] [facility]. 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 The findings were similar to when  examined 2016. There was normal appearance of the great saphenous veins and short saphenous veins bilaterally no evidence for superficial venous reflux and  no varicosities. No indication for venous ablation We have been using Iodoflex. Changed to Sorbact today 9/21; 2 small openings in close juxtaposition on the right medial lower leg. She had difficulties this week with her 3 layer compression becoming too tight she took this off late last Wednesday/Thursday morning. She did not put anything else on her leg quite a bit of nonpitting/lymphedema today still using Sorbact 9/28; 2 small wounds in close juxtaposition in the right medial lower leg. We have been using Sorbact. Changed to endoform today 10/5; 2 small wounds in close juxtaposition the right medial lower leg I changed to endoform last week unfortunately not much improvement 10/12; 2 small wounds in close juxtaposition on the right lower leg in the setting of severe chronic venous insufficiency secondary to prior DVTs. 10/19; 2 small wounds in close juxtaposition in the right lower leg in the setting of severe chronic venous insufficiency secondary to prior DVTs. The more anterior one is closed however the more posterior one still has not changed that much. Considerable depth. We have been using endoform 10/26; not much change in the 1 remaining wound. Still deep and punched out looking. Adherent surface slough. We have been using endoform. We have been trying to run Grafix PL but have not heard a response yet 11/2; wound is measuring slightly smaller. Depth looks like it is come in better. Better looking surface although still requiring a light debridement. Grafix PL not covered by [place] [facility], we will run Oasis 11/9; not much change in dimensions although the wound cleans up quite well. Still have not heard about Oasis using endoform 11/16; the patient has a $200 co-pay for Oasis. She is using endoform and making slow but steady  improvements. The wound is cleaning up quite nicely 11/30; using endoform under compression. One wound remaining. Still debris on the surface. Unacceptable co-pay for advanced treatment products 12/7; still using endoform under compression. 1 more wound remaining. Still requiring debridement. 12/14; still using endoform under compression. Wound is smaller. Electronic Signature(s) Signed: 04/19/2019 5:56:50 PM By: Baltazar Najjar MD Entered By: Baltazar Najjar on 04/19/2019 10:53:14 -------------------------------------------------------------------------------- Physical Exam Details Patient Name: Date of Service: [name], [name] 04/19/2019 9:15 AM Medical Record [id number] Patient Account Number: [id number] Date of Birth/Sex: Treating RN: 1930-07-02 (85 y.o. Tami Link Primary Care Provider: Alva Garnet Other Clinician: Referring Provider: Treating Provider/Extender:Suliman Termini, Tami Muecke, Tami Andrew in Treatment: 19 Constitutional Sitting or standing Blood Pressure is within target range for patient.. Pulse regular and within target range for patient.[name] Respirations regular, non-labored and within target range.. Temperature is normal and within the target range for the patient.[name] Appears in no distress. Notes Wound exam; right medial lower leg. Small punched out area with improvement in surface area. #3 curette debriding of eschar around the circumference some debris on the surface Electronic Signature(s) Signed: 04/19/2019 5:56:50 PM By: Baltazar Najjar MD Entered By: Baltazar Najjar on 04/19/2019 10:53:53 -------------------------------------------------------------------------------- Physician Orders Details Patient Name: Date of Service: Tami Comber D. 04/19/2019 9:15 AM Medical Record [id number] Patient Account Number: [id number] Date of Birth/Sex: Treating RN: 06-13-1930 (85 y.o. Tami Link Primary Care Provider: Alva Garnet Other Clinician: Referring Provider: Treating Provider/Extender:Yazmine Sorey, Tami Satre, Tami Andrew in Treatment: 19 Verbal / Phone Orders: No Diagnosis Coding ICD-10 Coding Code Description L97.312 Non-pressure chronic ulcer of right ankle with fat layer exposed I89.0 Lymphedema, not elsewhere classified I87.2 Venous insufficiency (chronic) (peripheral) I70.235 Atherosclerosis of native arteries of right leg with ulceration of  other part of foot Follow-up Appointments Return Appointment in 2 Landry. - MD Nurse Visit: - 1 week for rewrap Dressing Change Frequency Wound #5 Right,Medial Lower Leg Do not change entire dressing for one week. Skin Barriers/Peri-Wound Care Wound #5 Right,Medial Lower Leg Moisturizing lotion Wound Cleansing Wound #5 Right,Medial Lower Leg May shower with protection. Primary Wound Dressing Wound #5 Right,Medial Lower Leg Polymem Silver Secondary Dressing Wound #5 Right,Medial Lower Leg Dry Gauze Edema Control 3 Layer Compression System - Right Lower Extremity Avoid standing for long periods of time Elevate legs to the level of the heart or above for 30 minutes daily and/or when sitting, a frequency of: - throughout the day Exercise regularly Support Garment 20-30 mm/Hg pressure to: - Juxtalite to left leg Electronic Signature(s) Signed: 04/19/2019 5:56:50 PM By: Baltazar Najjarobson, Elgin Carn MD Signed: 04/19/2019 6:02:21 PM By: Zandra AbtsLynch, Shatara RN, BSN Entered By: Zandra AbtsLynch, Tami on 04/19/2019 10:06:47 -------------------------------------------------------------------------------- Problem List Details Patient Name: Date of Service: [name], Tami D. 04/19/2019 9:15 AM Medical Record [id number] Patient Account Number: [id number] Date of Birth/Sex: Treating RN: 07/04/1930 (85 y.o. [name]) Landry, Tami Primary Care Provider: Alva GarnetShelton, Tami R Other Clinician: Referring Provider: Treating Provider/Extender:Skie Vitrano, Tami BrooksMichael Landry, [name]  in Treatment: 19 Active Problems ICD-10 Evaluated Encounter Code Description Active Date Today Diagnosis L97.312 Non-pressure chronic ulcer of right ankle with fat layer 12/07/2018 No Yes exposed I89.0 Lymphedema, not elsewhere classified 12/21/2018 No Yes I87.2 Venous insufficiency (chronic) (peripheral) 12/07/2018 No Yes I70.235 Atherosclerosis of native arteries of right leg with 02/01/2019 No Yes ulceration of other part of foot Inactive Problems Resolved Problems Electronic Signature(s) Signed: 04/19/2019 5:56:50 PM By: Baltazar Najjarobson, Fatumata Kashani MD Entered By: Baltazar Najjarobson, Lashae Wollenberg on 04/19/2019 10:52:33 -------------------------------------------------------------------------------- Progress Note Details Patient Name: Date of Service: [name], Tami D. 04/19/2019 9:15 AM Medical Record [id number] Patient Account Number: [id number] Date of Birth/Sex: Treating RN: 10/25/1930 (85 y.o. Dorthula PerfectF) Landry, Emeterio ReeveShatara Primary Care Provider: Alva GarnetShelton, Tami R Other Clinician: Referring Provider: Treating Provider/Extender:Mada Sadik, Tami BrooksMichael Landry, [name] in Treatment: 19 Subjective History of Present Illness (HPI) Pleasant 85 year old African-American female with history of bilateral DVTs with presumed history of hypercoagulable state, with venous insufficiency as a result, presenting with 3-week onset of right medial leg wounds just above the right ankle of 3 Landry duration. Patient was given a course of doxycycline for 7 days that she completed, has been putting mupirocin cream on the wounds, leaving it exposed at night and covering with dry gauze during the day. Overall patient who is present with her daughter in the clinic today states that the 2 wounds have been improving to the present size. There is also some seepage in and around the wound area. Both legs are swollen. No changes to dimensions of legs overall in the recent past. Patient was remotely seen here in the clinic in 2014 for right  lower leg tibial wounds very similar to the ones were seeing today. 8/10-Patient returns after being initiated in the wound clinic last week. The right medial leg wounds about the right ankle are both slightly smaller in size, not much seepage, legs are not as swollen we are using a 3 layer compression of the right juxta lite on the left she denies any significant pain 8/17; the patient has 2 small open areas on the medial right leg. She has good edema control. There is no doubt she has underlying lymphedema. Still a lot of adherent debris to both wounds 8/24; same 2 small open areas on the medial right leg. Edema control. We  have been using Iodoflex since last week and the surface of the wound looks some better. 9/14; 2 small openings on the right medial lower leg. The patient went for her venous reflux studies in [place] [facility]. 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 The findings were similar to when examined 2016. There was normal appearance of the great saphenous veins and short saphenous veins bilaterally no evidence for superficial venous reflux and no varicosities. No indication for venous ablation We have been using Iodoflex. Changed to Sorbact today 9/21; 2 small openings in close juxtaposition on the right medial lower leg. She had difficulties this week with her 3 layer compression becoming too tight she took this off late last Wednesday/Thursday morning. She did not put anything else on her leg quite a bit of nonpitting/lymphedema today still using Sorbact 9/28; 2 small wounds in close juxtaposition in the right medial lower leg. We have been using Sorbact. Changed to endoform today 10/5; 2 small wounds in close juxtaposition the right medial lower leg I changed to endoform last week unfortunately not much improvement 10/12; 2 small wounds in close juxtaposition on the right lower leg in the setting of severe  chronic venous insufficiency secondary to prior DVTs. 10/19; 2 small wounds in close juxtaposition in the right lower leg in the setting of severe chronic venous insufficiency secondary to prior DVTs. The more anterior one is closed however the more posterior one still has not changed that much. Considerable depth. We have been using endoform 10/26; not much change in the 1 remaining wound. Still deep and punched out looking. Adherent surface slough. We have been using endoform. We have been trying to run Grafix PL but have not heard a response yet 11/2; wound is measuring slightly smaller. Depth looks like it is come in better. Better looking surface although still requiring a light debridement. Grafix PL not covered by [place] [facility], we will run Oasis 11/9; not much change in dimensions although the wound cleans up quite well. Still have not heard about Oasis using endoform 11/16; the patient has a $200 co-pay for Oasis. She is using endoform and making slow but steady improvements. The wound is cleaning up quite nicely 11/30; using endoform under compression. One wound remaining. Still debris on the surface. Unacceptable co-pay for advanced treatment products 12/7; still using endoform under compression. 1 more wound remaining. Still requiring debridement. 12/14; still using endoform under compression. Wound is smaller. Objective Constitutional Sitting or standing Blood Pressure is within target range for patient.. Pulse regular and within target range for patient.[name] Respirations regular, non-labored and within target range.. Temperature is normal and within the target range for the patient.[name] Appears in no distress. Vitals Time Taken: 9:48 AM, Height: 62 in, Weight: 158 lbs, BMI: 28.9, Temperature: 98.4 F, Pulse: 41 bpm, Respiratory Rate: 18 breaths/min, Blood Pressure: 135/47 mmHg. General Notes: Wound exam; right medial lower leg. Small punched out area with improvement in surface  area. #3 curette debriding of eschar around the circumference some debris on the surface Integumentary (Hair, Skin) Wound #5 status is Open. Original cause of wound was Gradually Appeared. The wound is located on the Right,Medial Lower Leg. The wound measures 0.4cm length x 0.5cm width x 0.2cm depth; 0.157cm^2 area and 0.031cm^3 volume. There is Fat Layer (Subcutaneous Tissue) Exposed exposed. There is no tunneling or undermining noted. There is a small amount of serosanguineous drainage noted. The wound margin is well defined and not attached  to the wound base. There is large (67-100%) pink, pale granulation within the wound bed. There is a small (1-33%) amount of necrotic tissue within the wound bed including Adherent Slough. 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 There was a Selective/Open Wound Skin/Epidermis Debridement with a total area of 0.2 sq cm performed by Ricard Dillon., MD. With the following instrument(s): Curette to remove Non-Viable tissue/material. Material removed includes Skin: Epidermis and Biofilm and. No specimens were taken. A time out was conducted at 10:04, prior to the start of the procedure. There was no bleeding. The procedure was tolerated well with a pain level of 0 throughout and a pain level of 0 following the procedure. Post Debridement Measurements: 0.4cm length x 0.5cm width x 0.2cm depth; 0.031cm^3 volume. Character of Wound/Ulcer Post Debridement is improved. Post procedure Diagnosis Wound #5: Same as Pre-Procedure Pre-procedure diagnosis of Wound #5 is a Vasculopathy located on the Right,Medial Lower Leg . There  was a Three Layer Compression Therapy Procedure by Levan Hurst, RN. Post procedure Diagnosis Wound #5: Same as Pre-Procedure Plan Follow-up Appointments: Return Appointment in 2 Landry. - MD Nurse Visit: - 1 week for rewrap Dressing Change Frequency: Wound #5 Right,Medial Lower Leg: Do not change entire dressing for one week. Skin Barriers/Peri-Wound Care: Wound #5 Right,Medial Lower Leg: Moisturizing lotion Wound Cleansing: Wound #5 Right,Medial Lower Leg: May shower with protection. Primary Wound Dressing: Wound #5 Right,Medial Lower Leg: Polymem Silver Secondary Dressing: Wound #5 Right,Medial Lower Leg: Dry Gauze Edema Control: 3 Layer Compression System - Right Lower Extremity Avoid standing for long periods of time Elevate legs to the level of the heart or above for 30 minutes daily and/or when sitting, a frequency of: - throughout the day Exercise regularly Support Garment 20-30 mm/Hg pressure to: - Juxtalite to left leg 1. I am continuing with the endoform. The wound appears to be slowly contracting. 3 layer compression to the right lower leg Electronic Signature(s) Signed: 04/19/2019 5:56:50 PM By: Linton Ham MD Entered By: Linton Ham on 04/19/2019 10:55:10 -------------------------------------------------------------------------------- SuperBill Details Patient Name: Date of Service: DERRICK, ORRIS 04/19/2019 Medical Record [id number] Patient Account Number: [id number] Date of Birth/Sex: Treating RN: [birth year] (85 y.o. [name] Primary Care Provider: Salena Saner Other Clinician: Referring Provider: Treating Provider/Extender:Ahron Hulbert, Rhona Leavens, Doristine Locks in Treatment: 19 Diagnosis Coding ICD-10 Codes Code Description [phone] Non-pressure chronic ulcer of right ankle with fat layer exposed I89.0 Lymphedema, not elsewhere classified I87.2 Venous insufficiency (chronic) (peripheral) I70.235 Atherosclerosis of  native arteries of right leg with ulceration of other part of foot Facility Procedures CPT4 Code Description: [id number] - DEBRIDE WOUND 1ST 20 SQ CM OR < ICD-10 Diagnosis Description Z16.967 Non-pressure chronic ulcer of right ankle with fat layer expos Modifier: ed Quantity: 1 Physician Procedures Electronic Signature(s) Signed: 04/19/2019 5:56:50 PM By: Linton Ham MD Entered By: Linton Ham on 04/19/2019 10:55:25

## 2019-04-26 ENCOUNTER — Encounter (HOSPITAL_BASED_OUTPATIENT_CLINIC_OR_DEPARTMENT_OTHER): Payer: Medicare Other | Admitting: Internal Medicine

## 2019-04-26 ENCOUNTER — Other Ambulatory Visit: Payer: Self-pay

## 2019-04-26 DIAGNOSIS — L97312 Non-pressure chronic ulcer of right ankle with fat layer exposed: Secondary | ICD-10-CM | POA: Diagnosis not present

## 2019-04-28 NOTE — Progress Notes (Signed)
[name], [name] ([id number]) Visit Report for 04/26/2019 SuperBill Details Patient Name: Date of Service: [name], [name] 04/26/2019 Medical Record [id number] Patient Account Number: [id number] Date of Birth/Sex: Treating RN: July 09, 1930 (85 y.o. [name] Primary Care Provider: Salena Saner Other Clinician: Referring Provider: Treating Provider/Extender:Crystol Walpole, Rhona Leavens, Doristine Locks in Treatment: 20 Diagnosis Coding ICD-10 Codes Code Description [phone] Non-pressure chronic ulcer of right ankle with fat layer exposed I89.0 Lymphedema, not elsewhere classified I87.2 Venous insufficiency (chronic) (peripheral) I70.235 Atherosclerosis of native arteries of right leg with ulceration of other part of foot Facility Procedures CPT4 Code Description Modifier Quantity [phone] (Facility Use Only) [phone] - APPLY MULTLAY COMPRS LWR RT LEG 1 Electronic Signature(s) Signed: 04/27/2019 7:47:18 AM By: Linton Ham MD Signed: 04/28/2019 4:54:21 PM By: Kela Millin Entered By: Kela Millin on 04/26/2019 10:47:10

## 2019-04-28 NOTE — Progress Notes (Signed)
[name] ([id number]) Visit Report for 04/26/2019 Arrival Information Details Patient Name: Date of Service: [name], [name] 04/26/2019 10:15 AM Medical Record [id number] Patient Account Number: [id number] Date of Birth/Sex: Treating RN: 1930/12/04 (85 y.o. [name], [name] Primary Care Genell Thede: Salena Saner Other Clinician: Referring Stepanie Graver: Treating Estee Yohe/Extender:Robson, Rhona Leavens, Doristine Locks in Treatment: 20 Visit Information History Since Last Visit Added or deleted any medications: No Patient Arrived: [name] Any new allergies or adverse reactions: No Arrival Time: 10:27 Had a fall or experienced change in No Accompanied By: family activities of daily living that may affect member risk of falls: Transfer Assistance: None Signs or symptoms of abuse/neglect since last No Patient Identification Verified: Yes visito Secondary Verification Process Yes Hospitalized since last visit: No Completed: Implantable device outside of the clinic excluding No Patient Requires Transmission-Based No cellular tissue based products placed in the center Precautions: since last visit: Patient Has Alerts: No Has Dressing in Place as Prescribed: Yes Has Compression in Place as Prescribed: Yes Pain Present Now: No Electronic Signature(s) Signed: 04/28/2019 4:54:21 PM By: Kela Millin Entered By: Kela Millin on 04/26/2019 10:28:38 -------------------------------------------------------------------------------- Compression Therapy Details Patient Name: Date of Service: Tami Slade D. 04/26/2019 10:15 AM Medical Record [id number] Patient Account Number: [id number] Date of Birth/Sex: Treating RN: 10-11-1930 (85 y.o. [name] Primary Care Tran Randle: Salena Saner Other Clinician: Referring Freda Jaquith: Treating Aubri Gathright/Extender:Robson, Rhona Leavens, Doristine Locks in Treatment: 20 Compression Therapy Performed for  Wound Wound #5 Right,Medial Lower Leg Assessment: Performed By: Clinician Kela Millin, RN Compression Type: Three Layer Electronic Signature(s) Signed: 04/28/2019 4:54:21 PM By: Kela Millin Entered By: Kela Millin on 04/26/2019 10:45:27 -------------------------------------------------------------------------------- Encounter Discharge Information Details Patient Name: Date of Service: [name] 04/26/2019 10:15 AM Medical Record [id number] Patient Account Number: [id number] Date of Birth/Sex: Treating RN: Apr 30, 1931 (85 y.o. [name] Primary Care Exzavier Ruderman: Salena Saner Other Clinician: Referring Makeisha Jentsch: Treating Kashana Breach/Extender:Robson, Rhona Leavens, Doristine Locks in Treatment: 20 Encounter Discharge Information Items Discharge Condition: Stable Ambulatory Status: Cane Discharge Destination: Home Transportation: Private Auto Accompanied By: self Schedule Follow-up Appointment: Yes Clinical Summary of Care: Patient Declined Electronic Signature(s) Signed: 04/28/2019 4:54:21 PM By: Kela Millin Entered By: Kela Millin on 04/26/2019 10:46:39 -------------------------------------------------------------------------------- Patient/Caregiver Education Details Patient Name: Date of Service: [name] 12/21/2020andnbsp10:15 AM Medical Record Patient Account Number: [id number] Number: Treating RN: Kela Millin Date of Birth/Gender: 03/07/1931 (85 y.o. Other Clinician: F) Treating Linton Ham Primary Care Physician: Salena Saner Physician/Extender: Referring Physician: Teodoro Kil in Treatment: 20 Education Assessment Education Provided To: Patient Education Topics Provided Wound/Skin Impairment: Methods: Explain/Verbal Responses: State content correctly Electronic Signature(s) Signed: 04/28/2019 4:54:21 PM By: Kela Millin Entered By: Kela Millin on  04/26/2019 10:45:50 -------------------------------------------------------------------------------- Wound Assessment Details Patient Name: Date of Service: [name] 04/26/2019 10:15 AM Medical Record [id number] Patient Account Number: [id number] Date of Birth/Sex: Treating RN: [birth year] (85 y.o. [name], [name] Primary Care Kamren Heskett: Salena Saner Other Clinician: Referring Shameek Nyquist: Treating Adrea Sherpa/Extender:Robson, Rhona Leavens, Doristine Locks in Treatment: 20 Wound Status Wound Number: 5 Primary Etiology: Vasculopathy Wound Location: Right Lower Leg - Medial Wound Status: Open Wounding Event: Gradually Appeared Comorbid History: Deep Vein Thrombosis, Hypertension Date Acquired: 11/04/2018 Weeks Of Treatment: 20 Clustered Wound: No Wound Measurements Length: (cm) 0.4 % Reduct Width: (cm) 0.5 % Reduct Depth: (cm) 0.2 Epitheli Area: (cm) 0.157 Tunneli Volume: (cm) 0.031 Undermi Wound Description Classification: Full Thickness Without Exposed Support Foul Od Structures Slough/  Wound Well defined, not attached Margin: Exudate Small Amount: Exudate Serosanguineous Type: Exudate red, brown Color: Wound Bed Granulation Amount: Large (67-100%) Granulation Quality: Pink, Pale Fascia E Necrotic Amount: Small (1-33%) Fat Laye Necrotic Quality: Adherent Slough Tendon E Muscle E Joint Ex Bone Exp Electronic Signature(s) Signed: 04/28/2019 4:54:21 PM By: Cherylin Mylar Entered By: Cherylin Mylar on 12/21/ or After Cleansing: No Fibrino Yes Exposed Structure xposed: No r (Subcutaneous Tissue) Exposed: Yes xposed: No xposed: No posed: No osed: No 2020 10:45:07 ion in Area: 96.1% ion in Volume: 96.1% alization: Large (67-100%) ng: No ning: No -------------------------------------------------------------------------------- Vitals Details Patient Name: Date of Service: [name] 04/26/2019 10:15 AM Medical Record  [id number] Patient Account Number: [id number] Date of Birth/Sex: Treating RN: Sep 14, 1930 (85 y.o. Harvest Dark Primary Care Edrick Whitehorn: Alva Garnet Other Clinician: Referring Nyxon Strupp: Treating Eleina Jergens/Extender:Robson, Gaylene Pantoja, Ivonne Andrew in Treatment: 20 Vital Signs Time Taken: 10:28 Temperature (F): 98 Height (in): 62 Pulse (bpm): 43 Weight (lbs): 158 Respiratory Rate (breaths/min): 18 Body Mass Index (BMI): 28.9 Blood Pressure (mmHg): 123/52 Reference Range: 80 - 120 mg / dl Electronic Signature(s) Signed: 04/28/2019 4:54:21 PM By: Cherylin Mylar Entered By: Cherylin Mylar on 04/26/2019 10:31:39

## 2019-05-03 ENCOUNTER — Other Ambulatory Visit: Payer: Self-pay

## 2019-05-03 ENCOUNTER — Encounter (HOSPITAL_BASED_OUTPATIENT_CLINIC_OR_DEPARTMENT_OTHER): Payer: Medicare Other | Admitting: Internal Medicine

## 2019-05-03 DIAGNOSIS — L97312 Non-pressure chronic ulcer of right ankle with fat layer exposed: Secondary | ICD-10-CM | POA: Diagnosis not present

## 2019-05-03 NOTE — Progress Notes (Signed)
RMANI, KAPUSTA ([id number]) Visit Report for 05/03/2019 HPI Details Patient Name: Date of Service: [name], [name] 05/03/2019 9:00 AM Medical Record [id number] Patient Account Number: [id number] Date of Birth/Sex: Treating RN: [birth year] (85 y.o. F) Primary Care Provider: Salena Saner Other Clinician: Referring Provider: Treating Provider/Extender:Dantonio Justen, Rhona Leavens, Doristine Locks in Treatment: 21 History of Present Illness HPI Description: Pleasant 85 year old African-American female with history of bilateral DVTs with presumed history of hypercoagulable state, with venous insufficiency as a result, presenting with 3-week onset of right medial leg wounds just above the right ankle of 3 weeks duration. Patient was given a course of doxycycline for 7 days that she completed, has been putting mupirocin cream on the wounds, leaving it exposed at night and covering with dry gauze during the day. Overall patient who is present with her daughter in the clinic today states that the 2 wounds have been improving to the present size. There is also some seepage in and around the wound area. Both legs are swollen. No changes to dimensions of legs overall in the recent past. Patient was remotely seen here in the clinic in 2014 for right lower leg tibial wounds very similar to the ones were seeing today. 8/10-Patient returns after being initiated in the wound clinic last week. The right medial leg wounds about the right ankle are both slightly smaller in size, not much seepage, legs are not as swollen we are using a 3 layer compression of the right juxta lite on the left she denies any significant pain 8/17; the patient has 2 small open areas on the medial right leg. She has good edema control. There is no doubt she has underlying lymphedema. Still a lot of adherent debris to both wounds 8/24; same 2 small open areas on the medial right leg. Edema control. We have been using  Iodoflex since last week and the surface of the wound looks some better. 9/14; 2 small openings on the right medial lower leg. The patient went for her venous reflux studies in [place] imaging. 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 The findings were similar to when examined 2016. There was normal appearance of the great saphenous veins and short saphenous veins bilaterally no evidence for superficial venous reflux and no varicosities. No indication for venous ablation We have been using Iodoflex. Changed to Sorbact today 9/21; 2 small openings in close juxtaposition on the right medial lower leg. She had difficulties this week with her 3 layer compression becoming too tight she took this off late last Wednesday/Thursday morning. She did not put anything else on her leg quite a bit of nonpitting/lymphedema today still using Sorbact 9/28; 2 small wounds in close juxtaposition in the right medial lower leg. We have been using Sorbact. Changed to endoform today 10/5; 2 small wounds in close juxtaposition the right medial lower leg I changed to endoform last week unfortunately not much improvement 10/12; 2 small wounds in close juxtaposition on the right lower leg in the setting of severe chronic venous insufficiency secondary to prior DVTs. 10/19; 2 small wounds in close juxtaposition in the right lower leg in the setting of severe chronic venous insufficiency secondary to prior DVTs. The more anterior one is closed however the more posterior one still has not changed that much. Considerable depth. We have been using endoform 10/26; not much change in the 1 remaining wound. Still deep and punched out looking. Adherent surface slough. We have been  using endoform. We have been trying to run Grafix PL but have not heard a response yet 11/2; wound is measuring slightly smaller. Depth looks like it is come in better. Better looking  surface although still requiring a light debridement. Grafix PL not covered by [employer] [employer], we will run Oasis 11/9; not much change in dimensions although the wound cleans up quite well. Still have not heard about Oasis using endoform 11/16; the patient has a $200 co-pay for Oasis. She is using endoform and making slow but steady improvements. The wound is cleaning up quite nicely 11/30; using endoform under compression. One wound remaining. Still debris on the surface. Unacceptable co-pay for advanced treatment products 12/7; still using endoform under compression. 1 more wound remaining. Still requiring debridement. 12/14; still using endoform under compression. Wound is smaller. 12/28; the patient comes in today with the area on her right medial lower leg closed. She was not wearing her juxta lite stocking on the left leg and the degree of edema in her left lower leg/ankle would be unacceptable and resultant opening of the wound on the right if this was on the right side. She has bilateral juxta lites. Electronic Signature(s) Signed: 05/03/2019 6:10:19 PM By: Baltazar Najjarobson, Rivers Hamrick MD Entered By: Baltazar Najjarobson, Jaimie Pippins on 05/03/2019 11:05:38 -------------------------------------------------------------------------------- Physical Exam Details Patient Name: Date of Service: [name], Tami D. 05/03/2019 9:00 AM Medical Record [id number] Patient Account Number: [id number] Date of Birth/Sex: Treating RN: 10/17/1930 (85 y.o. F) Primary Care Provider: Alva GarnetShelton, Kimberly R Other Clinician: Referring Provider: Treating Provider/Extender:Gwendolyn Nishi, Gaylene BrooksMichael Shelton, Ivonne AndrewKimberly R Weeks in Treatment: 21 Notes Wound exam; right medial lower leg. This is finally closed over. She has significant chronic venous insufficiency. Electronic Signature(s) Signed: 05/03/2019 6:10:19 PM By: Baltazar Najjarobson, Ediel Unangst MD Entered By: Baltazar Najjarobson, Kegan Shepardson on 05/03/2019  11:06:01 -------------------------------------------------------------------------------- Physician Orders Details Patient Name: Date of Service: [name], Tami D. 05/03/2019 9:00 AM Medical Record [id number] Patient Account Number: [id number] Date of Birth/Sex: Treating RN: 02/28/1931 (85 y.o. [name]) [name], Shatara Primary Care Provider: Alva GarnetShelton, Kimberly R Other Clinician: Referring Provider: Treating Provider/Extender:Nadir Vasques, Gaylene BrooksMichael Shelton, Ivonne AndrewKimberly R Weeks in Treatment: 21 Verbal / Phone Orders: No Diagnosis Coding ICD-10 Coding Code Description L97.312 Non-pressure chronic ulcer of right ankle with fat layer exposed I89.0 Lymphedema, not elsewhere classified I87.2 Venous insufficiency (chronic) (peripheral) I70.235 Atherosclerosis of native arteries of right leg with ulceration of other part of foot [facility] Skin Barriers/Peri-Wound Care Moisturizing lotion - both legs daily Edema Control Support Garment 20-30 mm/Hg pressure to: - Juxtalite to both legs daily - apply first thing in the morning, remove at night before bedtime Electronic Signature(s) Signed: 05/03/2019 6:10:06 PM By: Zandra AbtsLynch, Shatara RN, BSN Signed: 05/03/2019 6:10:19 PM By: Baltazar Najjarobson, Jyren Cerasoli MD Entered By: Zandra AbtsLynch, Shatara on 05/03/2019 10:27:30 -------------------------------------------------------------------------------- Problem List Details Patient Name: Date of Service: [name], Tami D. 05/03/2019 9:00 AM Medical Record [id number] Patient Account Number: [id number] Date of Birth/Sex: Treating RN: 05/11/1930 (85 y.o. [name]) [name], Shatara Primary Care Provider: Alva GarnetShelton, Kimberly R Other Clinician: Referring Provider: Treating Provider/Extender:Emmanuel Gruenhagen, Gaylene BrooksMichael Shelton, Ivonne AndrewKimberly R Weeks in Treatment: 21 Active Problems ICD-10 Evaluated Encounter Code Description Active Date Today Diagnosis L97.312 Non-pressure chronic ulcer of right ankle with fat  layer 12/07/2018 No Yes exposed I89.0 Lymphedema, not elsewhere classified 12/21/2018 No Yes I87.2 Venous insufficiency (chronic) (peripheral) 12/07/2018 No Yes I70.235 Atherosclerosis of native arteries of right leg with 02/01/2019 No Yes ulceration of other part of foot Inactive Problems Resolved Problems Electronic Signature(s) Signed: 05/03/2019 6:10:19 PM By:  Baltazar Najjar MD Entered By: Baltazar Najjar on 05/03/2019 11:04:27 -------------------------------------------------------------------------------- Progress Note Details Patient Name: Date of Service: [name], [name] 05/03/2019 9:00 AM Medical Record [id number] Patient Account Number: [id number] Date of Birth/Sex: Treating RN: [birth year] (85 y.o. F) Primary Care Provider: Alva Garnet Other Clinician: Referring Provider: Treating Provider/Extender:Ellaree Gear, Gaylene Leavens, Ivonne Andrew in Treatment: 21 Subjective History of Present Illness (HPI) Pleasant 85 year old African-American female with history of bilateral DVTs with presumed history of hypercoagulable state, with venous insufficiency as a result, presenting with 3-week onset of right medial leg wounds just above the right ankle of 3 weeks duration. Patient was given a course of doxycycline for 7 days that she completed, has been putting mupirocin cream on the wounds, leaving it exposed at night and covering with dry gauze during the day. Overall patient who is present with her daughter in the clinic today states that the 2 wounds have been improving to the present size. There is also some seepage in and around the wound area. Both legs are swollen. No changes to dimensions of legs overall in the recent past. Patient was remotely seen here in the clinic in 2014 for right lower leg tibial wounds very similar to the ones were seeing today. 8/10-Patient returns after being initiated in the wound clinic last week. The right medial leg wounds about the  right ankle are both slightly smaller in size, not much seepage, legs are not as swollen we are using a 3 layer compression of the right juxta lite on the left she denies any significant pain 8/17; the patient has 2 small open areas on the medial right leg. She has good edema control. There is no doubt she has underlying lymphedema. Still a lot of adherent debris to both wounds 8/24; same 2 small open areas on the medial right leg. Edema control. We have been using Iodoflex since last week and the surface of the wound looks some better. 9/14; 2 small openings on the right medial lower leg. The patient went for her venous reflux studies in [place] imaging. 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 The findings were similar to when examined 2016. There was normal appearance of the great saphenous veins and short saphenous veins bilaterally no evidence for superficial venous reflux and no varicosities. No indication for venous ablation We have been using Iodoflex. Changed to Sorbact today 9/21; 2 small openings in close juxtaposition on the right medial lower leg. She had difficulties this week with her 3 layer compression becoming too tight she took this off late last Wednesday/Thursday morning. She did not put anything else on her leg quite a bit of nonpitting/lymphedema today still using Sorbact 9/28; 2 small wounds in close juxtaposition in the right medial lower leg. We have been using Sorbact. Changed to endoform today 10/5; 2 small wounds in close juxtaposition the right medial lower leg I changed to endoform last week unfortunately not much improvement 10/12; 2 small wounds in close juxtaposition on the right lower leg in the setting of severe chronic venous insufficiency secondary to prior DVTs. 10/19; 2 small wounds in close juxtaposition in the right lower leg in the setting of severe chronic venous insufficiency secondary  to prior DVTs. The more anterior one is closed however the more posterior one still has not changed that much. Considerable depth. We have been using endoform 10/26; not much change in the 1 remaining wound. Still deep and punched out looking. Adherent  surface slough. We have been using endoform. We have been trying to run Grafix PL but have not heard a response yet 11/2; wound is measuring slightly smaller. Depth looks like it is come in better. Better looking surface although still requiring a light debridement. Grafix PL not covered by [employer], we will run Oasis 11/9; not much change in dimensions although the wound cleans up quite well. Still have not heard about Oasis using endoform 11/16; the patient has a $200 co-pay for Oasis. She is using endoform and making slow but steady improvements. The wound is cleaning up quite nicely 11/30; using endoform under compression. One wound remaining. Still debris on the surface. Unacceptable co-pay for advanced treatment products 12/7; still using endoform under compression. 1 more wound remaining. Still requiring debridement. 12/14; still using endoform under compression. Wound is smaller. 12/28; the patient comes in today with the area on her right medial lower leg closed. She was not wearing her juxta lite stocking on the left leg and the degree of edema in her left lower leg/ankle would be unacceptable and resultant opening of the wound on the right if this was on the right side. She has bilateral juxta lites. Objective Constitutional Vitals Time Taken: 9:40 AM, Height: 62 in, Weight: 158 lbs, BMI: 28.9, Temperature: 98.1 F, Pulse: 58 bpm, Respiratory Rate: 18 breaths/min, Blood Pressure: 112/58 mmHg. Integumentary (Hair, Skin) Wound #5 status is Healed - Epithelialized. Original cause of wound was Gradually Appeared. The wound is located on the Right,Medial Lower Leg. The wound measures 0cm length x 0cm width x 0cm depth; 0cm^2 area  and 0cm^3 volume. There is no tunneling or undermining noted. There is a none present amount of drainage noted. The wound margin is well defined and not attached to the wound base. There is no granulation within the wound bed. There is no necrotic tissue within the wound bed. Assessment Active Problems ICD-10 Non-pressure chronic ulcer of right ankle with fat layer exposed Lymphedema, not elsewhere classified Venous insufficiency (chronic) (peripheral) Atherosclerosis of native arteries of right leg with ulceration of other part of foot [facility] Skin Barriers/Peri-Wound Care: Moisturizing lotion - both legs daily Edema Control: Support Garment 20-30 mm/Hg pressure to: - Juxtalite to both legs daily - apply first thing in the morning, remove at night before bedtime #1 continue with skin moisturization. Encouraged to use her juxta lites daily. 2. As an incidental thing the patient came into the clinic today with liquid diarrhea and complaints of dysgeusia. She was referred for Covid testing Electronic Signature(s) Signed: 05/03/2019 6:10:19 PM By: Baltazar Najjarobson, Aaniya Sterba MD Entered By: Baltazar Najjarobson, Zeek Rostron on 05/03/2019 11:06:57 -------------------------------------------------------------------------------- SuperBill Details Patient Name: Date of Service: [name], Tami D. 05/03/2019 Medical Record [id number] Patient Account Number: [id number] Date of Birth/Sex: Treating RN: 12/01/1930 (85 y.o. [name]) [name], Shatara Primary Care Provider: Alva GarnetShelton, Kimberly R Other Clinician: Referring Provider: Treating Provider/Extender:Astin Rape, Gaylene BrooksMichael Shelton, Ivonne AndrewKimberly R Weeks in Treatment: 21 Diagnosis Coding ICD-10 Codes Code Description [id number] Non-pressure chronic ulcer of right ankle with fat layer exposed I89.0 Lymphedema, not elsewhere classified I87.2 Venous insufficiency (chronic) (peripheral) I70.235 Atherosclerosis of native arteries  of right leg with ulceration of other part of foot Facility Procedures CPT4 Code: [id number] Description: 99213 - WOUND CARE VISIT-LEV 3 EST PT Modifier: Quantity: 1 Physician Procedures CPT4 Code Description: [id number] 99212 - WC PHYS LEVEL 2 - EST PT ICD-10 Diagnosis Description L97.312 Non-pressure chronic ulcer of right ankle with fat layer I89.0 Lymphedema, not  elsewhere classified I87.2 Venous insufficiency (chronic) (peripheral) Modifier: exposed Quantity: 1 Electronic Signature(s) Signed: 05/03/2019 6:10:19 PM By: Baltazar Najjar MD Entered By: Baltazar Najjar on 05/03/2019 11:07:19

## 2019-05-03 NOTE — Progress Notes (Signed)
[name] ([id number]) Visit Report for 05/03/2019 Arrival Information Details Patient Name: Date of Service: [name], [name] 05/03/2019 9:00 AM Medical Record [id number] Patient Account Number: [id number] Date of Birth/Sex: Treating RN: [birth year] (85 y.o. [name], [name] Primary Care Deaglan Lile: Alva Garnet Other Clinician: Referring Sherrell Farish: Treating Montana Bryngelson/Extender:Robson, Gaylene Haggar, Ivonne Andrew in Treatment: 21 Visit Information History Since Last Visit Added or deleted any medications: No Patient Arrived: Gil[place] Mor Any new allergies or adverse reactions: No Arrival Time: 09:40 daughter Had a fall or experienced change in No Accompanied By: activities of daily living that may affect Transfer Assistance: None risk of falls: Patient Identification Verified: Yes Signs or symptoms of abuse/neglect since last No Secondary Verification Process Completed: Yes visito Patient Requires Transmission-Based No Hospitalized since last visit: No Precautions: Implantable device outside of the clinic excluding No Patient Has Alerts: No cellular tissue based products placed in the center since last visit: Has Dressing in Place as Prescribed: Yes Has Compression in Place as Prescribed: Yes Pain Present Now: No Electronic Signature(s) Signed: 05/03/2019 5:46:04 PM By: Cherylin Mylar Entered By: Cherylin Mylar on 05/03/2019 09:40:35 -------------------------------------------------------------------------------- Clinic Level of Care Assessment Details Patient Name: Date of Service: [name] 05/03/2019 9:00 AM Medical Record [id number] Patient Account Number: [id number] Date of Birth/Sex: Treating RN: 27-Jan-1931 (85 y.o. [name] Primary Care Gabe Glace: Alva Garnet Other Clinician: Referring Amayiah Gosnell: Treating Katalaya Beel/Extender:Robson, Gaylene Biffle, Ivonne Andrew in Treatment: 21 Clinic Level of Care Assessment  Items TOOL 4 Quantity Score X - Use when only an EandM is performed on FOLLOW-UP visit 1 0 ASSESSMENTS - Nursing Assessment / Reassessment X - Reassessment of Co-morbidities (includes updates in patient status) 1 10 X - Reassessment of Adherence to Treatment Plan 1 5 ASSESSMENTS - Wound and Skin Assessment / Reassessment X - Simple Wound Assessment / Reassessment - one wound 1 5 []  - Complex Wound Assessment / Reassessment - multiple wounds 0 []  - Dermatologic / Skin Assessment (not related to wound area) 0 ASSESSMENTS - Focused Assessment []  - Circumferential Edema Measurements - multi extremities 0 []  - Nutritional Assessment / Counseling / Intervention 0 X - Lower Extremity Assessment (monofilament, tuning fork, pulses) 1 5 []  - Peripheral Arterial Disease Assessment (using hand held doppler) 0 ASSESSMENTS - Ostomy and/or Continence Assessment and Care []  - Incontinence Assessment and Management 0 []  - Ostomy Care Assessment and Management (repouching, etc.) 0 PROCESS - Coordination of Care X - Simple Patient / Family Education for ongoing care 1 15 []  - Complex (extensive) Patient / Family Education for ongoing care 0 X - Staff obtains , Records, Test Results / Process Orders 1 10 []  - Staff telephones HHA, Nursing Homes / Clarify orders / etc 0 []  - Routine Transfer to another Facility (non-emergent condition) 0 []  - Routine Hospital Admission (non-emergent condition) 0 []  - New Admissions / / Ordering NPWT, Apligraf, etc. 0 []  - Emergency Hospital Admission (emergent condition) 0 X - Simple Discharge Coordination 1 10 []  - Complex (extensive) Discharge Coordination 0 PROCESS - Special Needs []  - Pediatric / Minor Patient Management 0 []  - Isolation Patient Management 0 []  - Hearing / Language / Visual special needs 0 []  - Assessment of Community assistance (transportation, D/C planning, etc.) 0 []  - Additional assistance / Altered mentation  0 []  - Support Surface(s) Assessment (bed, cushion, seat, etc.) 0 INTERVENTIONS - Wound Cleansing / Measurement X - Simple Wound Cleansing - one wound 1 5 []  - Complex  Wound Cleansing - multiple wounds 0 X - Wound Imaging (photographs - any number of wounds) 1 5 []  - Wound Tracing (instead of photographs) 0 X - Simple Wound Measurement - one wound 1 5 []  - Complex Wound Measurement - multiple wounds 0 INTERVENTIONS - Wound Dressings []  - Small Wound Dressing one or multiple wounds 0 []  - Medium Wound Dressing one or multiple wounds 0 []  - Large Wound Dressing one or multiple wounds 0 []  - Application of Medications - topical 0 []  - Application of Medications - injection 0 INTERVENTIONS - Miscellaneous []  - External ear exam 0 []  - Specimen Collection (cultures, biopsies, blood, body fluids, etc.) 0 []  - Specimen(s) / Culture(s) sent or taken to Lab for analysis 0 []  - Patient Transfer (multiple staff / Nurse, adult / Similar devices) 0 []  - Simple Staple / Suture removal (25 or less) 0 []  - Complex Staple / Suture removal (26 or more) 0 []  - Hypo / Hyperglycemic Management (close monitor of Blood Glucose) 0 []  - Ankle / Brachial Index (ABI) - do not check if billed separately 0 X - Vital Signs 1 5 Has the patient been seen at the hospital within the last three years: Yes Total Score: 80 Level Of Care: New/Established - Level 3 Electronic Signature(s) Signed: 05/03/2019 6:10:06 PM By: Zandra Abts RN, BSN Entered By: Zandra Abts on 05/03/2019 10:28:48 -------------------------------------------------------------------------------- Encounter Discharge Information Details Patient Name: Date of Service: Tami Comber D. 05/03/2019 9:00 AM Medical Record [id number] Patient Account Number: [id number] Date of Birth/Sex: Treating RN: 1931/02/18 (85 y.o. [name] Primary Care Zaryan Yakubov: Alva Garnet Other Clinician: Referring Tahjae Durr: Treating  Itzael Liptak/Extender:Robson, Gaylene Caffey, Ivonne Andrew in Treatment: 21 Encounter Discharge Information Items Discharge Condition: Stable Ambulatory Status: Ambulatory Discharge Destination: Home Transportation: Private Auto Accompanied By: daughter Schedule Follow-up Appointment: Yes Clinical Summary of Care: Patient Declined Electronic Signature(s) Signed: 05/03/2019 6:10:06 PM By: Zandra Abts RN, BSN Entered By: Zandra Abts on 05/03/2019 17:39:22 -------------------------------------------------------------------------------- Lower Extremity Assessment Details Patient Name: Date of Service: Tami Comber D. 05/03/2019 9:00 AM Medical Record [id number] Patient Account Number: [id number] Date of Birth/Sex: Treating RN: 1930/10/28 (85 y.o. Harvest Dark Primary Care Amreen Raczkowski: Alva Garnet Other Clinician: Referring Kayda Allers: Treating Nyoka Alcoser/Extender:Robson, Gaylene Pumphrey, Ivonne Andrew in Treatment: 21 Edema Assessment Assessed: [Left: No] [Right: No] Edema: [Left: N] [Right: o] Calf Left: Right: Point of Measurement: 35 cm From Medial Instep cm 30 cm Ankle Left: Right: Point of Measurement: 10 cm From Medial Instep cm 21 cm Vascular Assessment Pulses: Dorsalis Pedis Palpable: [Right:Yes] Electronic Signature(s) Signed: 05/03/2019 5:46:04 PM By: Cherylin Mylar Entered By: Cherylin Mylar on 05/03/2019 09:49:53 -------------------------------------------------------------------------------- Multi Wound Chart Details Patient Name: Date of Service: Tami Comber D. 05/03/2019 9:00 AM Medical Record [id number] Patient Account Number: [id number] Date of Birth/Sex: Treating RN: Mar 01, 1931 (85 y.o. F) Primary Care Li Fragoso: Alva Garnet Other Clinician: Referring Chyanna Flock: Treating Sanyla Summey/Extender:Robson, Gaylene Schepp, Ivonne Andrew in Treatment: 21 Vital Signs Height(in): 62 Pulse(bpm): 58 Weight(lbs):  158 Blood Pressure(mmHg): 112/58 Body Mass Index(BMI): 29 Temperature(F): 98.1 Respiratory 18 Rate(breaths/min): Photos: [5:No Photos] [N/A:N/A] Wound Location: [5:Right Lower Leg - Medial N/A] Wounding Event: [5:Gradually Appeared] [N/A:N/A] Primary Etiology: [5:Vasculopathy] [N/A:N/A] Comorbid History: [5:Deep Vein Thrombosis, Hypertension] [N/A:N/A] Date Acquired: [5:11/04/2018] [N/A:N/A] Weeks of Treatment: [5:21] [N/A:N/A] Wound Status: [5:Healed - Epithelialized] [N/A:N/A] Measurements L x W x D 0x0x0 [N/A:N/A] (cm) Area (cm) : [5:0] [N/A:N/A] Volume (cm) : [5:0] [N/A:N/A] % Reduction in Area: [5:100.00%] [N/A:N/A] % Reduction  in Volume: 100.00% [N/A:N/A] Classification: [5:Full Thickness Without Exposed Support Structures] [N/A:N/A] Exudate Amount: [5:None Present] [N/A:N/A] Wound Margin: [5:Well defined, not attached N/A] Granulation Amount: [5:None Present (0%)] [N/A:N/A] Necrotic Amount: [5:None Present (0%)] [N/A:N/A] Exposed Structures: [5:Fascia: No Fat Layer (Subcutaneous Tissue) Exposed: No Tendon: No Muscle: No Joint: No Bone: No Large (67-100%)] [N/A:N/A N/A] Treatment Notes Electronic Signature(s) Signed: 05/03/2019 6:10:19 PM By: Linton Ham MD Entered By: Linton Ham on 05/03/2019 11:04:34 -------------------------------------------------------------------------------- Multi-Disciplinary Care Plan Details Patient Name: Date of Service: [name] 05/03/2019 9:00 AM Medical Record [name] Patient Account Number: [id number] Date of Birth/Sex: Treating RN: 04/23/1931 (85 y.o. [name] Primary Care Baleria Wyman: Salena Saner Other Clinician: Referring Jermy Couper: Treating Edrees Valent/Extender:Robson, Rhona Leavens, Doristine Locks in Treatment: 21 Active Inactive Electronic Signature(s) Signed: 05/03/2019 6:10:06 PM By: Levan Hurst RN, BSN Entered By: Levan Hurst on 05/03/2019  10:27:52 -------------------------------------------------------------------------------- Pain Assessment Details Patient Name: Date of Service: SHERLYNE, Landry 05/03/2019 9:00 AM Medical Record [id number] Patient Account Number: [id number] Date of Birth/Sex: Treating RN: 11/24/1930 (85 y.o. [name] Primary Care Aleana Fifita: Salena Saner Other Clinician: Referring Amanda Pote: Treating Ezrah Panning/Extender:Robson, Rhona Leavens, Doristine Locks in Treatment: 21 Active Problems Location of Pain Severity and Description of Pain Patient Has Paino No Site Locations Pain Management and Medication Current Pain Management: Electronic Signature(s) Signed: 05/03/2019 5:46:04 PM By: Kela Millin Entered By: Kela Millin on 05/03/2019 09:42:47 -------------------------------------------------------------------------------- Patient/Caregiver Education Details Patient Name: Date of Service: [name] 12/28/2020andnbsp9:00 AM Medical Record [id number] Patient Account Number: [id number] Date of Birth/Gender: Treating RN: [birth year] (85 y.o. [name] Primary Care Physician: Salena Saner Other Clinician: Referring Physician: Treating Physician/Extender:Robson, Rhona Leavens, Doristine Locks in Treatment: 21 Education Assessment Education Provided To: Patient Education Topics Provided Wound/Skin Impairment: Methods: Explain/Verbal Responses: State content correctly [employer]) Signed: 05/03/2019 6:10:06 PM By: Levan Hurst RN, BSN Entered By: Levan Hurst on 05/03/2019 10:28:01 -------------------------------------------------------------------------------- Wound Assessment Details Patient Name: Date of Service: [name] 05/03/2019 9:00 AM Medical Record [id number] Patient Account Number: [id number] Date of Birth/Sex: Treating RN: 27-Nov-1930 (85 y.o. [name] Primary Care Anavey Coombes:  Salena Saner Other Clinician: Referring Sarra Rachels: Treating Permelia Bamba/Extender:Robson, Rhona Leavens, Doristine Locks in Treatment: 21 Wound Status Wound Number: 5 Primary Etiology: Vasculopathy Wound Location: Right Lower Leg - Medial Wound Status: Healed - Epithelialized Wounding Event: Gradually Appeared Comorbid History: Deep Vein Thrombosis, Hypertension Date Acquired: 11/04/2018 Weeks Of Treatment: 21 Clustered Wound: No Wound Measurements Length: (cm) 0 % Reduc Width: (cm) 0 % Reduc Depth: (cm) 0 Epithel Area: (cm) 0 Tunnel Volume: (cm) 0 Underm Wound Description Classification: Full Thickness Without Exposed Support [place] Wound Well defined, not attached Margin: Exudate None Present Amount: Wound Bed Granulation Amount: None Present (0%) Necrotic Amount: None Present (0%) Fascia Fat La Tendon Muscle Joint Bone E dor After Cleansing: No /Fibrino No Exposed Structure Exposed: No yer (Subcutaneous Tissue) Exposed: No Exposed: No Exposed: No Exposed: No xposed: No tion in Area: 100% tion in Volume: 100% ialization: Large (67-100%) ing: No ining: No Electronic Signature(s) Signed: 05/03/2019 5:46:04 PM By: Kela Millin Signed: 05/03/2019 6:10:06 PM By: Levan Hurst RN, BSN Entered By: Levan Hurst on 05/03/2019 10:26:25 -------------------------------------------------------------------------------- Vitals Details Patient Name: Date of Service: [name] 05/03/2019 9:00 AM Medical Record [id number] Patient Account Number: [id number] Date of Birth/Sex: Treating RN: 1930/12/01 (85 y.o. [name] Primary Care Taisley Mordan: Salena Saner Other Clinician: Referring Sirus Labrie: Treating Latrina Guttman/Extender:Robson, Rhona Leavens,  Merlene LaughterKimberly R Weeks in Treatment: 21 Vital Signs Time Taken: 09:40 Temperature (F): 98.1 Height (in): 62 Pulse (bpm): 58 Weight (lbs): 158 Respiratory Rate  (breaths/min): 18 Body Mass Index (BMI): 28.9 Blood Pressure (mmHg): 112/58 Reference Range: 80 - 120 mg / dl Electronic Signature(s) Signed: 05/03/2019 5:46:04 PM By: Cherylin Mylarwiggins, Shannon Entered By: Cherylin Mylarwiggins, Shannon on 05/03/2019 09:41:38

## 2019-05-04 ENCOUNTER — Ambulatory Visit: Payer: Medicare Other | Attending: Internal Medicine

## 2019-05-04 DIAGNOSIS — Z20822 Contact with and (suspected) exposure to covid-19: Secondary | ICD-10-CM

## 2019-05-06 LAB — NOVEL CORONAVIRUS, NAA: SARS-CoV-2, NAA: NOT DETECTED

## 2019-05-10 ENCOUNTER — Emergency Department (HOSPITAL_BASED_OUTPATIENT_CLINIC_OR_DEPARTMENT_OTHER)
Admission: EM | Admit: 2019-05-10 | Discharge: 2019-05-10 | Disposition: A | Discharge status: Home / Self Care | Payer: Medicare Other | Attending: Emergency Medicine | Admitting: Emergency Medicine

## 2019-05-10 ENCOUNTER — Encounter (HOSPITAL_BASED_OUTPATIENT_CLINIC_OR_DEPARTMENT_OTHER): Payer: Medicare Other | Admitting: Internal Medicine

## 2019-05-10 ENCOUNTER — Other Ambulatory Visit: Payer: Self-pay

## 2019-05-10 ENCOUNTER — Encounter (HOSPITAL_BASED_OUTPATIENT_CLINIC_OR_DEPARTMENT_OTHER): Payer: Self-pay | Admitting: *Deleted

## 2019-05-10 ENCOUNTER — Emergency Department (HOSPITAL_BASED_OUTPATIENT_CLINIC_OR_DEPARTMENT_OTHER): Payer: Medicare Other

## 2019-05-10 DIAGNOSIS — I82409 Acute embolism and thrombosis of unspecified deep veins of unspecified lower extremity: Secondary | ICD-10-CM | POA: Diagnosis not present

## 2019-05-10 DIAGNOSIS — Z79899 Other long term (current) drug therapy: Secondary | ICD-10-CM | POA: Diagnosis not present

## 2019-05-10 DIAGNOSIS — Z7901 Long term (current) use of anticoagulants: Secondary | ICD-10-CM | POA: Diagnosis not present

## 2019-05-10 DIAGNOSIS — I10 Essential (primary) hypertension: Secondary | ICD-10-CM | POA: Diagnosis not present

## 2019-05-10 DIAGNOSIS — R1310 Dysphagia, unspecified: Secondary | ICD-10-CM | POA: Diagnosis present

## 2019-05-10 LAB — BASIC METABOLIC PANEL
Anion gap: 13 (ref 5–15)
BUN: 57 mg/dL — ABNORMAL HIGH (ref 8–23)
CO2: 19 mmol/L — ABNORMAL LOW (ref 22–32)
Calcium: 8.9 mg/dL (ref 8.9–10.3)
Chloride: 110 mmol/L (ref 98–111)
Creatinine, Ser: 1.58 mg/dL — ABNORMAL HIGH (ref 0.44–1.00)
GFR calc Af Amer: 34 mL/min — ABNORMAL LOW (ref >60)
GFR calc non Af Amer: 29 mL/min — ABNORMAL LOW (ref >60)
Glucose, Bld: 61 mg/dL — ABNORMAL LOW (ref 70–99)
Potassium: 4.4 mmol/L (ref 3.5–5.1)
Sodium: 142 mmol/L (ref 135–145)

## 2019-05-10 LAB — CBC WITH DIFFERENTIAL/PLATELET
Abs Immature Granulocytes: 0.02 10*3/uL (ref 0.00–0.07)
Basophils Absolute: 0 10*3/uL (ref 0.0–0.1)
Basophils Relative: 0 %
Eosinophils Absolute: 0 10*3/uL (ref 0.0–0.5)
Eosinophils Relative: 1 %
HCT: 36.4 % (ref 36.0–46.0)
Hemoglobin: 11.5 g/dL — ABNORMAL LOW (ref 12.0–15.0)
Immature Granulocytes: 0 %
Lymphocytes Relative: 22 %
Lymphs Abs: 1.4 10*3/uL (ref 0.7–4.0)
MCH: 29.5 pg (ref 26.0–34.0)
MCHC: 31.6 g/dL (ref 30.0–36.0)
MCV: 93.3 fL (ref 80.0–100.0)
Monocytes Absolute: 0.6 10*3/uL (ref 0.1–1.0)
Monocytes Relative: 10 %
Neutro Abs: 4.2 10*3/uL (ref 1.7–7.7)
Neutrophils Relative %: 67 %
Platelets: 238 10*3/uL (ref 150–400)
RBC: 3.9 MIL/uL (ref 3.87–5.11)
RDW: 15.1 % (ref 11.5–15.5)
WBC: 6.3 10*3/uL (ref 4.0–10.5)
nRBC: 0 % (ref 0.0–0.2)

## 2019-05-10 MED ORDER — SODIUM CHLORIDE 0.9 % IV BOLUS (SEPSIS)
500.0000 mL | Freq: Once | INTRAVENOUS | Status: AC
  Administered 2019-05-10: 500 mL via INTRAVENOUS

## 2019-05-10 MED ORDER — SODIUM CHLORIDE 0.9 % IV SOLN
1000.0000 mL | INTRAVENOUS | Status: DC
  Administered 2019-05-10: 14:00:00 1000 mL via INTRAVENOUS

## 2019-05-10 NOTE — ED Provider Notes (Signed)
  Physical Exam  BP 129/64 (BP Location: Right Arm)   Pulse 72   Temp 98.1 F (36.7 C) (Oral)   Resp 14   Ht 5\' 4"  (1.626 m)   Wt 69.4 kg   SpO2 100%   BMI 26.26 kg/m   Physical Exam  ED Course/Procedures   Clinical Course as of May 09 1641  Mon May 10, 2019  1345 Anemia stable.  Labs with increased bun and creatinine   [JK]  1426 Patient has been able to tolerate Ensure   [JK]  1455 Chest x-ray shows large hiatal hernia   [JK]    Clinical Course User Index [JK] May 12, 2019, MD    Procedures  MDM  Received patient in signout.  Dehydration due to difficulty swallowing.  Large hiatal hernia.  Has been tolerating Ensure and liquids.  Eventually was able to reach [facility].  They will arrange for follow-up this week calling patient.  Feels better after fluid boluses.  Will discharge home      Linwood Dibbles, MD 05/10/19 [phone]

## 2019-05-10 NOTE — ED Notes (Signed)
ED Provider at bedside. 

## 2019-05-10 NOTE — ED Notes (Signed)
Attempted to call dana Campusano  At [phone] to update on grandmother  No answer

## 2019-05-10 NOTE — ED Notes (Signed)
Pt given chx broth, remote and warm blanket and is resting.

## 2019-05-10 NOTE — ED Provider Notes (Signed)
[facility] EMERGENCY DEPARTMENT Provider Note   CSN: [id number] Arrival date & time: 05/10/19  1218     History Chief Complaint  Patient presents with  . Dysphagia    [name] is a 86 y.o. female.  HPI    Patient presents to the emergency room for evaluation of difficulty swallowing.  Patient states the symptoms started about 4 days ago.  Patient started having difficulty swallowing the solids.  Patient states she has to spit it up otherwise she feels like she will vomit.  She is able to swallow liquids.  She denies any fevers or chills.  No chest pain.  Patient denies any prior history of swallowing problems.  Patient states she saw her doctor today who instructed her to come to the ED.  Past Medical History:  Diagnosis Date  . Blood clot in vein   . GERD (gastroesophageal reflux disease)   . H/O echocardiogram 09/05/2009   EF >55%  . High cholesterol   . Hypertension   . Sleep apnea     Patient Active Problem List   Diagnosis Date Noted  . DVT (deep venous thrombosis) (HCC) 11/01/2015  . Acute DVT (deep venous thrombosis) (HCC) 11/01/2015    Past Surgical History:  Procedure Laterality Date  . ABDOMINAL HYSTERECTOMY    . CARDIAC CATHETERIZATION  01/27/2003  . NM MYOVIEW LTD  8/804   RULE OUT ISCHEMIA  . TUBAL LIGATION       OB History   No obstetric history on file.     Family History  Family history unknown: Yes    Social History   Tobacco Use  . Smoking status: Never Smoker  . Smokeless tobacco: Never Used  Substance Use Topics  . Alcohol use: No  . Drug use: No    Home Medications Prior to Admission medications   Medication Sig Start Date End Date Taking? Authorizing Provider  apixaban (ELIQUIS) 5 MG TABS tablet 10mg  PO BID for 7 days, then 5mg  PO BID thereafter. 11/02/15   , Mir Mohammed, MD  Multiple Vitamins-Minerals (CENTRUM SILVER ADULT 50+) TABS Take 1 tablet by mouth daily with breakfast.    [provider]   ranitidine (ZANTAC) 150 MG tablet Take 150 mg by mouth daily with breakfast.    [provider]  simvastatin (ZOCOR) 40 MG tablet 1 TABLET ORALLY ONCE A DAY 11/05/14   [provider]  valsartan-hydrochlorothiazide (DIOVAN-HCT) 160-25 MG per tablet Take 1 tablet by mouth daily with breakfast.    [provider]  VITAMIN D, CHOLECALCIFEROL, PO Take 5,000 Units by mouth daily.    [provider]    Allergies    Patient has no known allergies.  Review of Systems   Review of Systems  All other systems reviewed and are negative.   Physical Exam Updated Vital Signs BP (!) 115/58 (BP Location: Right Arm)   Pulse (!) 53   Temp 98.1 F (36.7 C) (Oral)   Resp 14   Ht 1.626 m (5\' 4" )   Wt 69.4 kg   SpO2 99%   BMI 26.26 kg/m   Physical Exam Vitals and nursing note reviewed.  Constitutional:      General: She is not in acute distress.    Appearance: She is well-developed.  HENT:     Head: Normocephalic and atraumatic.     Right Ear: External ear normal.     Left Ear: External ear normal.     Mouth/Throat:  Mouth: Mucous membranes are moist.     Pharynx: No oropharyngeal exudate or posterior oropharyngeal erythema.     Comments: No lesions, no exudate or erythema Eyes:     General: No scleral icterus.       Right eye: No discharge.        Left eye: No discharge.     Conjunctiva/sclera: Conjunctivae normal.  Neck:     Trachea: No tracheal deviation.  Cardiovascular:     Rate and Rhythm: Normal rate and regular rhythm.  Pulmonary:     Effort: Pulmonary effort is normal. No respiratory distress.     Breath sounds: Normal breath sounds. No stridor. No wheezing or rales.  Abdominal:     General: Bowel sounds are normal. There is no distension.     Palpations: Abdomen is soft.     Tenderness: There is no abdominal tenderness. There is no guarding or rebound.  Musculoskeletal:        General: No swelling or deformity.     Cervical back: Neck  supple.     Right lower leg: No edema.     Left lower leg: No edema.  Skin:    General: Skin is warm and dry.     Findings: No rash.  Neurological:     Mental Status: She is alert.     Cranial Nerves: Cranial nerve deficit: no gross deficits.     Sensory: No sensory deficit.     Motor: No abnormal muscle tone or seizure activity.     Coordination: Coordination normal.     ED Results / Procedures / Treatments   Labs (all labs ordered are listed, but only abnormal results are displayed) Labs Reviewed  CBC WITH DIFFERENTIAL/PLATELET  BASIC METABOLIC PANEL    EKG None  Radiology No results found.  Procedures Procedures (including critical care time)  Medications Ordered in ED Medications  sodium chloride 0.9 % bolus 500 mL (has no administration in time range)    Followed by  0.9 %  sodium chloride infusion (has no administration in time range)    ED Course  I have reviewed the triage vital signs and the nursing notes.  Pertinent labs & imaging results that were available during my care of the patient were reviewed by me and considered in my medical decision making (see chart for details).  Clinical Course as of May 10 817  Mon May 10, 2019  1345 Anemia stable.  Labs with increased bun and creatinine   [JK]  1426 Patient has been able to tolerate Ensure   [JK]  1455 Chest x-ray shows large hiatal hernia   [JK]    Clinical Course User Index [JK] Dorie Rank, MD   MDM Rules/Calculators/A&P                      Pt presents with dysphagia.  Difficulty swallowing liquids.  Pt noted to have increased bun and creatinine.  NO anemia or sx of gi bleeding.  Large hiatal hernia noted on cxr.  ?contributing to her sx.  Esophageal stricture also a concern.  Pt able to tolerate ensure. Pt would benefit from GI consultation.  Not available at the free standing ED.  Will consult with GI to discuss further treatment, close outpt follow up.  Dr Alvino Chapel to follow up on phone  call consultation. Final Clinical Impression(s) / ED Diagnoses Final diagnoses:  Dysphagia, unspecified type    Rx / DC Orders ED Discharge Orders  None       Linwood Dibbles, MD 05/11/19 [phone]

## 2019-05-10 NOTE — Discharge Instructions (Addendum)
Eat and drink what you can, particularly liquids.  Larger harder foods are more likely to get stuck.  [facility] should be calling for an appointment this week.

## 2019-05-10 NOTE — ED Notes (Signed)
Called daughter to pick up pt. She wants someone to go over paperwork when she gets here (from [place])

## 2019-05-10 NOTE — ED Triage Notes (Addendum)
Arrived ems w diff swallowing x 2 weeks,  Unable to eat, feels dehydrated  A&O  Talking with out diff,  States was sent by her md   Denies any other sx

## 2019-05-10 NOTE — ED Notes (Signed)
Pt given warm blanket and resting

## 2019-05-17 ENCOUNTER — Ambulatory Visit: Payer: Medicare Other | Admitting: Physician Assistant

## 2019-05-17 ENCOUNTER — Encounter (HOSPITAL_BASED_OUTPATIENT_CLINIC_OR_DEPARTMENT_OTHER): Payer: Medicare Other | Admitting: Internal Medicine

## 2019-05-17 ENCOUNTER — Encounter: Payer: Self-pay | Admitting: Physician Assistant

## 2019-05-17 VITALS — BP 102/58 | HR 64 | Temp 97.4°F | Ht 64.0 in | Wt 141.6 lb

## 2019-05-17 DIAGNOSIS — K219 Gastro-esophageal reflux disease without esophagitis: Secondary | ICD-10-CM

## 2019-05-17 DIAGNOSIS — R1314 Dysphagia, pharyngoesophageal phase: Secondary | ICD-10-CM | POA: Diagnosis not present

## 2019-05-17 DIAGNOSIS — K449 Diaphragmatic hernia without obstruction or gangrene: Secondary | ICD-10-CM | POA: Diagnosis not present

## 2019-05-17 MED ORDER — OMEPRAZOLE 20 MG PO CPDR: 20.0000 mg | DELAYED_RELEASE_CAPSULE | Freq: Every day | ORAL | 3 refills | Status: DC

## 2019-05-17 NOTE — Progress Notes (Signed)
Chief Complaint: Dysphagia  HPI:    [name] is an 86 year old AA female with a past medical history as listed below including chronic anticoagulation with Eliquis and reflux, who was referred to me by Andi Devon, MD for a complaint of dysphagia.      It appears patient has history with Dr. Loreta Ave, though I cannot see this in the computer.  Does have pathology from 01/16/2005 which shows hyperplastic polyps.    05/10/2019 patient seen in the ED for dysphagia.  Presented to clinic and explained that symptoms that started 4 days ago and she was having difficulty swallowing solids.  BMP showed a creatinine increased at 1.58, BUN elevated at 57.  Patient had a chest x-ray which showed a known large hiatal hernia.  CBC with hemoglobin 11.5 which appears right above patient's baseline.    Today, patient presents to clinic with her daughter who does assist with history and explains that about 3 weeks ago she started with symptoms of having trouble swallowing food and even liquid for a time.  They presented to the ED as above after being seen at their PCPs office.  Evaluation was somewhat unrevealing.  They explained that since then, over the past 3 days in fact, the patient has been improving, now more able to tolerate liquids and solids, now they are only some foods that she just cannot have like Cheerios with milk.  They also describe some nausea sometimes related to smells.  Patient is currently on Zantac 150 mg daily which she has been on for "years".    Denies fever, chills, change in bowel habits or abdominal pain.  Past Medical History:  Diagnosis Date  . Blood clot in vein   . GERD (gastroesophageal reflux disease)   . H/O echocardiogram 09/05/2009   EF >55%  . High cholesterol   . Hypertension   . Sleep apnea     Past Surgical History:  Procedure Laterality Date  . ABDOMINAL HYSTERECTOMY    . CARDIAC CATHETERIZATION  01/27/2003  . NM MYOVIEW LTD  8/804   RULE OUT ISCHEMIA  . TUBAL  LIGATION      Current Outpatient Medications  Medication Sig Dispense Refill  . apixaban (ELIQUIS) 5 MG TABS tablet 10mg  PO BID for 7 days, then 5mg  PO BID thereafter. 60 tablet 0  . Multiple Vitamins-Minerals (CENTRUM SILVER ADULT 50+) TABS Take 1 tablet by mouth daily with breakfast.    . ranitidine (ZANTAC) 150 MG tablet Take 150 mg by mouth daily with breakfast.    . simvastatin (ZOCOR) 40 MG tablet 1 TABLET ORALLY ONCE A DAY  3  . valsartan-hydrochlorothiazide (DIOVAN-HCT) 160-25 MG per tablet Take 1 tablet by mouth daily with breakfast.    . VITAMIN D, CHOLECALCIFEROL, PO Take 5,000 Units by mouth daily.     No current facility-administered medications for this visit.    Allergies as of 05/17/2019  . (No Known Allergies)    Family History  Family history unknown: Yes    Social History   Socioeconomic History  . Marital status: Single    Spouse name: Not on file  . Number of children: Not on file  . Years of education: Not on file  . Highest education level: Not on file  Occupational History  . Not on file  Tobacco Use  . Smoking status: Never Smoker  . Smokeless tobacco: Never Used  Substance and Sexual Activity  . Alcohol use: No  . Drug use: No  . Sexual  activity: Never  Other Topics Concern  . Not on file  Social History Narrative  . Not on file   Social Determinants of Health   Financial Resource Strain:   . Difficulty of Paying Living Expenses: Not on file  Food Insecurity:   . Worried About Charity fundraiser in the Last Year: Not on file  . Ran Out of Food in the Last Year: Not on file  Transportation Needs:   . Lack of Transportation (Medical): Not on file  . Lack of Transportation (Non-Medical): Not on file  Physical Activity:   . Days of Exercise per Week: Not on file  . Minutes of Exercise per Session: Not on file  Stress:   . Feeling of Stress : Not on file  Social Connections:   . Frequency of Communication with Friends and Family: Not  on file  . Frequency of Social Gatherings with Friends and Family: Not on file  . Attends Religious Services: Not on file  . Active Member of Clubs or Organizations: Not on file  . Attends Archivist Meetings: Not on file  . Marital Status: Not on file  Intimate Partner Violence:   . Fear of Current or Ex-Partner: Not on file  . Emotionally Abused: Not on file  . Physically Abused: Not on file  . Sexually Abused: Not on file    Review of Systems:    Constitutional: No weight loss, fever or chills Skin: No rash  Cardiovascular: No chest pain  Respiratory: No SOB  Gastrointestinal: See HPI and otherwise negative Genitourinary: No dysuria  Neurological: No headache Musculoskeletal: No new muscle or joint pain Hematologic: No bleeding Psychiatric: No history of depression or anxiety   Physical Exam:  Vital signs: BP (!) 102/58   Pulse 64   Temp (!) 97.4 F (36.3 C)   Ht 5\' 4"  (1.626 m)   Wt 141 lb 9.6 oz (64.2 kg)   SpO2 94%   BMI 24.31 kg/m   Constitutional:   Pleasant AA female appears to be in NAD, Well developed, Well nourished, alert and cooperative Head:  Normocephalic and atraumatic. Eyes:   PEERL, EOMI. No icterus. Conjunctiva pink. Ears:  Normal auditory acuity. Neck:  Supple Throat: Oral cavity and pharynx without inflammation, swelling or lesion.  Respiratory: Respirations even and unlabored. Lungs clear to auscultation bilaterally.   No wheezes, crackles, or rhonchi.  Cardiovascular: Normal S1, S2. No MRG. Regular rate and rhythm. No peripheral edema, cyanosis or pallor.  Gastrointestinal:  Soft, nondistended, nontender. No rebound or guarding. Normal bowel sounds. No appreciable masses or hepatomegaly. Rectal:  Not performed.  Msk:  Symmetrical without gross deformities. Without edema, no deformity or joint abnormality. Ambulating in wheelchair Neurologic:  Alert and  oriented x4;  grossly normal neurologically.  Skin:   Dry and intact without  significant lesions or rashes. Psychiatric: Demonstrates good judgement and reason without abnormal affect or behaviors.  RELEVANT LABS AND IMAGING: CBC    Component Value Date/Time   WBC 6.3 05/10/2019 1241   RBC 3.90 05/10/2019 1241   HGB 11.5 (L) 05/10/2019 1241   HCT 36.4 05/10/2019 1241   PLT 238 05/10/2019 1241   MCV 93.3 05/10/2019 1241   MCH 29.5 05/10/2019 1241   MCHC 31.6 05/10/2019 1241   RDW 15.1 05/10/2019 1241   LYMPHSABS 1.4 05/10/2019 1241   MONOABS 0.6 05/10/2019 1241   EOSABS 0.0 05/10/2019 1241   BASOSABS 0.0 05/10/2019 1241    CMP  Component Value Date/Time   NA 142 05/10/2019 1241   K 4.4 05/10/2019 1241   CL 110 05/10/2019 1241   CO2 19 (L) 05/10/2019 1241   GLUCOSE 61 (L) 05/10/2019 1241   BUN 57 (H) 05/10/2019 1241   CREATININE 1.58 (H) 05/10/2019 1241   CALCIUM 8.9 05/10/2019 1241   PROT 7.2 11/01/2015 1148   ALBUMIN 3.4 (L) 11/01/2015 1148   AST 19 11/01/2015 1148   ALT 12 (L) 11/01/2015 1148   ALKPHOS 52 11/01/2015 1148   BILITOT 0.3 11/01/2015 1148   GFRNONAA 29 (L) 05/10/2019 1241   GFRAA 34 (L) 05/10/2019 1241    Assessment: 1.  Dysphagia: Worse 3 weeks ago, some better over the past 3 days; consider stricture versus dysmotility versus hiatal hernia versus other 2.  GERD: Denies current symptoms on Zantac 150 mg daily 3.  Hiatal hernia: Large, seen on chest x-ray, likely contributing to above  Plan: 1.  Discussed hiatal hernia and what this could mean for the patient.  She was unaware of this diagnosis. 2.  Schedule patient for a barium swallow with tablet for further evaluation of dysphagia.  Pending this may need an EGD.  Briefly discussed this with the patient and her daughter. 3.  Stop patient Zantac.  Started omeprazole 20 mg daily, 36 minutes for breakfast #30 with 3 refills. 4.  Patient will follow in clinic per recommendations after barium swallow.  She was assigned to Dr. Orvan Falconer this morning.  Hyacinth Meeker,  PA-C [facility] 05/17/2019, 9:59 AM  Cc: Andi Devon, MD

## 2019-05-17 NOTE — Patient Instructions (Signed)
If you are age 84 or older, your body mass index should be between 23-30. Your Body mass index is 24.31 kg/m. If this is out of the aforementioned range listed, please consider follow up with your Primary Care Provider.  If you are age 84 or younger, your body mass index should be between 19-25. Your Body mass index is 24.31 kg/m. If this is out of the aformentioned range listed, please consider follow up with your Primary Care Provider.   We have sent the following medications to your pharmacy for you to pick up at your convenience: Omeprazole 20 mg daily 30 minutes before breakfast.  Stop Zantac.  You have been scheduled for a Barium Esophogram at [facility] (1st floor of the hospital) on Thursday 05/27/19 at 10:30 am. Please arrive 15 minutes prior to your appointment for registration. Make certain not to have anything to eat or drink 3 hours prior to your test. If you need to reschedule for any reason, please contact radiology at [phone] to do so. _______________________________________________________________ A barium swallow is an examination that concentrates on views of the esophagus. This tends to be a double contrast exam (barium and two liquids which, when combined, create a gas to distend the wall of the oesophagus) or single contrast (non-ionic iodine based). The study is usually tailored to your symptoms so a good history is essential. Attention is paid during the study to the form, structure and configuration of the esophagus, looking for functional disorders (such as aspiration, dysphagia, achalasia, motility and reflux) EXAMINATION You may be asked to change into a gown, depending on the type of swallow being performed. A radiologist and radiographer will perform the procedure. The radiologist will advise you of the type of contrast selected for your procedure and direct you during the exam. You will be asked to stand, sit or lie in several different positions and to  hold a small amount of fluid in your mouth before being asked to swallow while the imaging is performed .In some instances you may be asked to swallow barium coated marshmallows to assess the motility of a solid food bolus. The exam can be recorded as a digital or video fluoroscopy procedure. POST PROCEDURE It will take 1-2 days for the barium to pass through your system. To facilitate this, it is important, unless otherwise directed, to increase your fluids for the next 24-48hrs and to resume your normal diet.  This test typically takes about 30 minutes to perform. _________________________________________________________________

## 2019-05-17 NOTE — Progress Notes (Signed)
Reviewed and agree with management plans. ? ?Hazelynn Mckenny L. Aleeah Greeno, MD, MPH  ?

## 2019-05-24 ENCOUNTER — Encounter (HOSPITAL_BASED_OUTPATIENT_CLINIC_OR_DEPARTMENT_OTHER): Payer: Medicare Other | Admitting: Internal Medicine

## 2019-05-27 ENCOUNTER — Ambulatory Visit (HOSPITAL_COMMUNITY)
Admission: RE | Admit: 2019-05-27 | Discharge: 2019-05-27 | Disposition: A | Discharge status: Home / Self Care | Payer: Medicare Other | Source: Ambulatory Visit | Attending: Physician Assistant | Admitting: Physician Assistant

## 2019-05-27 ENCOUNTER — Other Ambulatory Visit: Payer: Self-pay

## 2019-05-27 DIAGNOSIS — K219 Gastro-esophageal reflux disease without esophagitis: Secondary | ICD-10-CM | POA: Diagnosis present

## 2019-05-27 DIAGNOSIS — R1314 Dysphagia, pharyngoesophageal phase: Secondary | ICD-10-CM

## 2019-05-31 ENCOUNTER — Telehealth: Payer: Self-pay | Admitting: Emergency Medicine

## 2019-05-31 DIAGNOSIS — E785 Hyperlipidemia, unspecified: Secondary | ICD-10-CM | POA: Insufficient documentation

## 2019-05-31 DIAGNOSIS — I10 Essential (primary) hypertension: Secondary | ICD-10-CM | POA: Insufficient documentation

## 2019-05-31 DIAGNOSIS — M069 Rheumatoid arthritis, unspecified: Secondary | ICD-10-CM | POA: Insufficient documentation

## 2019-05-31 DIAGNOSIS — G4730 Sleep apnea, unspecified: Secondary | ICD-10-CM | POA: Insufficient documentation

## 2019-05-31 DIAGNOSIS — R2681 Unsteadiness on feet: Secondary | ICD-10-CM | POA: Insufficient documentation

## 2019-05-31 NOTE — Telephone Encounter (Signed)
   IA LEEB Apr 02, 1931 [id number]  Dear Dr. Renae Gloss:  We have scheduled the above named patient for a(n) endoscopy procedure. Our records show that (s)he is on anticoagulation therapy.  Please advise as to whether the patient may come off their therapy of Eliquis  2 days prior to their procedure which is scheduled for 06-11-19.  Please route your response to Deneise Lever, CMA or fax response to [phone].  Sincerely,    [facility]

## 2019-05-31 NOTE — Telephone Encounter (Signed)
Spoke with nurse at Dr Mathews Robinsons office who states Dr Renae Gloss is ok with patient holding Eliquis x 2 days. She will sent over written documentation as well. Spoke with patients daughter and informed her to hold eliquis x 2 days. She verbalized understanding. Patient is scheduled for previsit on 06/04/19.

## 2019-06-04 ENCOUNTER — Ambulatory Visit (AMBULATORY_SURGERY_CENTER): Payer: Self-pay | Admitting: *Deleted

## 2019-06-04 ENCOUNTER — Other Ambulatory Visit: Payer: Self-pay

## 2019-06-04 VITALS — Temp 97.5°F | Ht 64.0 in | Wt 138.6 lb

## 2019-06-04 DIAGNOSIS — G4730 Sleep apnea, unspecified: Secondary | ICD-10-CM

## 2019-06-04 DIAGNOSIS — R1314 Dysphagia, pharyngoesophageal phase: Secondary | ICD-10-CM

## 2019-06-04 DIAGNOSIS — Z01818 Encounter for other preprocedural examination: Secondary | ICD-10-CM

## 2019-06-04 NOTE — Progress Notes (Signed)

## 2019-06-07 ENCOUNTER — Encounter: Payer: Self-pay | Admitting: Gastroenterology

## 2019-06-09 ENCOUNTER — Ambulatory Visit (INDEPENDENT_AMBULATORY_CARE_PROVIDER_SITE_OTHER): Payer: Medicare Other

## 2019-06-09 DIAGNOSIS — Z1159 Encounter for screening for other viral diseases: Secondary | ICD-10-CM

## 2019-06-10 LAB — SARS CORONAVIRUS 2 (TAT 6-24 HRS): SARS Coronavirus 2: NEGATIVE

## 2019-06-11 ENCOUNTER — Ambulatory Visit (AMBULATORY_SURGERY_CENTER): Payer: Medicare Other | Admitting: Gastroenterology

## 2019-06-11 ENCOUNTER — Other Ambulatory Visit: Payer: Self-pay

## 2019-06-11 ENCOUNTER — Encounter: Payer: Self-pay | Admitting: Gastroenterology

## 2019-06-11 VITALS — BP 123/56 | HR 51 | Temp 96.2°F | Resp 17 | Ht 64.0 in | Wt 138.0 lb

## 2019-06-11 DIAGNOSIS — R1314 Dysphagia, pharyngoesophageal phase: Secondary | ICD-10-CM

## 2019-06-11 DIAGNOSIS — K2950 Unspecified chronic gastritis without bleeding: Secondary | ICD-10-CM

## 2019-06-11 DIAGNOSIS — K219 Gastro-esophageal reflux disease without esophagitis: Secondary | ICD-10-CM | POA: Diagnosis not present

## 2019-06-11 DIAGNOSIS — K228 Other specified diseases of esophagus: Secondary | ICD-10-CM

## 2019-06-11 MED ORDER — SODIUM CHLORIDE 0.9 % IV SOLN: 500.0000 mL | Freq: Once | INTRAVENOUS | Status: DC

## 2019-06-11 NOTE — Progress Notes (Signed)
Vitals-DT Temp-JB  Pt's states no medical or surgical changes since previsit or office visit. 

## 2019-06-11 NOTE — Progress Notes (Signed)
Report to PACU, RN, vss, BBS= Clear.  

## 2019-06-11 NOTE — Progress Notes (Signed)
Called to room to assist during endoscopic procedure.  Patient ID and intended procedure confirmed with present staff. Received instructions for my participation in the procedure from the performing physician.  

## 2019-06-11 NOTE — Patient Instructions (Signed)
Resume Eliquis (apixaban) at prior dose tomorrow-06/12/19.   Handout provided on Hiatal hernia.   YOU HAD AN ENDOSCOPIC PROCEDURE TODAY AT THE [facility]:   Refer to the procedure report that was given to you for any specific questions about what was found during the examination.  If the procedure report does not answer your questions, please call your gastroenterologist to clarify.  If you requested that your care partner not be given the details of your procedure findings, then the procedure report has been included in a sealed envelope for you to review at your convenience later.  YOU SHOULD EXPECT: Some feelings of bloating in the abdomen. Passage of more gas than usual.  Walking can help get rid of the air that was put into your GI tract during the procedure and reduce the bloating. If you had a lower endoscopy (such as a colonoscopy or flexible sigmoidoscopy) you may notice spotting of blood in your stool or on the toilet paper. If you underwent a bowel prep for your procedure, you may not have a normal bowel movement for a few days.  Please Note:  You might notice some irritation and congestion in your nose or some drainage.  This is from the oxygen used during your procedure.  There is no need for concern and it should clear up in a day or so.  SYMPTOMS TO REPORT IMMEDIATELY:    Following upper endoscopy (EGD)  Vomiting of blood or coffee ground material  New chest pain or pain under the shoulder blades  Painful or persistently difficult swallowing  New shortness of breath  Fever of 100F or higher  Black, tarry-looking stools  For urgent or emergent issues, a gastroenterologist can be reached at any hour by calling [phone].   DIET:  We do recommend a small meal at first, but then you may proceed to your regular diet.  Drink plenty of fluids but you should avoid alcoholic beverages for 24 hours.  ACTIVITY:  You should plan to take it easy for the rest of today  and you should NOT DRIVE or use heavy machinery until tomorrow (because of the sedation medicines used during the test).    FOLLOW UP: Our staff will call the number listed on your records 48-72 hours following your procedure to check on you and address any questions or concerns that you may have regarding the information given to you following your procedure. If we do not reach you, we will leave a message.  We will attempt to reach you two times.  During this call, we will ask if you have developed any symptoms of COVID 19. If you develop any symptoms (ie: fever, flu-like symptoms, shortness of breath, cough etc.) before then, please call [phone].  If you test positive for Covid 19 in the 2 weeks post procedure, please call and report this information to [place].    If any biopsies were taken you will be contacted by phone or by letter within the next 1-3 weeks.  Please call us at [phone] if you have not heard about the biopsies in 3 weeks.    SIGNATURES/CONFIDENTIALITY: You and/or your care partner have signed paperwork which will be entered into your electronic medical record.  These signatures attest to the fact that that the information above on your After Visit Summary has been reviewed and is understood.  Full responsibility of the confidentiality of this discharge information lies with you and/or your care-partner.

## 2019-06-11 NOTE — Op Note (Addendum)
[facility] Patient Name: [name] Procedure Date: 06/11/2019 8:30 AM MRN: [id number] Endoscopist: Tressia Danas MD, MD Age: 86 Referring MD:  Date of Birth: 1931/02/08 Gender: Female Account #: [id number] Procedure:                Upper GI endoscopy Indications:              Dysphagia Medicines:                Monitored Anesthesia Care Procedure:                Pre-Anesthesia Assessment:                           - Prior to the procedure, a History and Physical                            was performed, and patient medications and                            allergies were reviewed. The patient's tolerance of                            previous anesthesia was also reviewed. The risks                            and benefits of the procedure and the sedation                            options and risks were discussed with the patient.                            All questions were answered, and informed consent                            was obtained. Prior Anticoagulants: The patient has                            taken Eliquis (apixaban), last dose was 2 days                            prior to procedure. ASA Grade Assessment: III - A                            patient with severe systemic disease. After                            reviewing the risks and benefits, the patient was                            deemed in satisfactory condition to undergo the                            procedure.  After obtaining informed consent, the endoscope was                            passed under direct vision. Throughout the                            procedure, the patient's blood pressure, pulse, and                            oxygen saturations were monitored continuously. The                            Endoscope was introduced through the mouth, and                            advanced to the third part of duodenum. The upper                            GI  endoscopy was accomplished without difficulty.                            The patient tolerated the procedure well. Scope In: Scope Out: Findings:                 The examined esophagus was normal. The caliber of                            the lumen is slightly dilated. No obvious                            presbyesophagus. The Z-line was located 29 cm from                            the incisors. Biopsies were taken from the                            proximal/mid esophagus and distal esophagus with a                            cold forceps for histology. Estimated blood loss                            was minimal.                           A 5 cm hiatal hernia was present. No Cameron's                            ulcers. But, the mucosa in the hernia is slightly                            erythematous and friable.  The entire examined stomach was normal. Biopsies                            were taken from the hernia as well as from the                            gastric body and antrum with a cold forceps for                            histology. Estimated blood loss was minimal.                           The examined duodenum was normal except for a small                            diverticulum in the duodenal bulb.                           The exam was otherwise without abnormality. Complications:            No immediate complications. Estimated blood loss:                            Minimal. Estimated Blood Loss:     Estimated blood loss was minimal. Impression:               - Normal esophagus. Biopsied.                           - Large hiatal hernia with mild erythema. Biopsied.                           - Normal stomach. Biopsied.                           - Small duodenal diverticulum.                           - The examination was otherwise normal. Recommendation:           - Patient has a contact number available for                             emergencies. The signs and symptoms of potential                            delayed complications were discussed with the                            patient. Return to normal activities tomorrow.                            Written discharge instructions were provided to the                            patient.                           -  Resume previous diet.                           - Continue present medications.                           - Resume Eliquis (apixaban) at prior dose tomorrow.                           - Await pathology results.                           - Follow-up with PA Hyacinth Meeker in 2 weeks Tressia Danas MD, MD 06/11/2019 8:53:45 AM This report has been signed electronically.

## 2019-06-15 ENCOUNTER — Telehealth: Payer: Self-pay

## 2019-06-15 NOTE — Telephone Encounter (Signed)
  Follow up Call-  Call back number 06/11/2019  Post procedure Call Back phone  # [phone]-Itzell's daughter  Permission to leave phone message Yes  Some recent data might be hidden     Patient questions:  Do you have a fever, pain , or abdominal swelling? No. Pain Score  0 *  Have you tolerated food without any problems? Yes.    Have you been able to return to your normal activities? Yes.    Do you have any questions about your discharge instructions: Diet   No. Medications  No. Follow up visit  No.  Do you have questions or concerns about your Care? No.  Actions: * If pain score is 4 or above: No action needed, pain <4.  1. Have you developed a fever since your procedure? No  2.   Have you had an respiratory symptoms (SOB or cough) since your procedure? No  3.   Have you tested positive for COVID 19 since your procedure No  4.   Have you had any family members/close contacts diagnosed with the COVID 19 since your procedure?  No   If yes to any of these questions please route to Laverna Peace, RN and Jennye Boroughs, RN.

## 2019-06-17 ENCOUNTER — Ambulatory Visit: Payer: Self-pay | Admitting: Physician Assistant

## 2019-06-24 ENCOUNTER — Ambulatory Visit: Payer: Medicare Other | Admitting: Physician Assistant

## 2019-07-08 ENCOUNTER — Encounter: Payer: Self-pay | Admitting: Physician Assistant

## 2019-07-08 ENCOUNTER — Ambulatory Visit: Payer: Medicare Other | Admitting: Physician Assistant

## 2019-07-08 VITALS — BP 118/60 | HR 55 | Temp 98.6°F | Ht 64.0 in | Wt 142.0 lb

## 2019-07-08 DIAGNOSIS — K449 Diaphragmatic hernia without obstruction or gangrene: Secondary | ICD-10-CM | POA: Diagnosis not present

## 2019-07-08 DIAGNOSIS — R1314 Dysphagia, pharyngoesophageal phase: Secondary | ICD-10-CM | POA: Diagnosis not present

## 2019-07-08 DIAGNOSIS — K219 Gastro-esophageal reflux disease without esophagitis: Secondary | ICD-10-CM

## 2019-07-08 NOTE — Progress Notes (Signed)
Chief Complaint: GERD and dysphagia  HPI:    [name] is an 86 year old African-American female with a past medical history as listed below including chronic anticoagulation with Eliquis and reflux, known to Dr. Orvan Falconer, who returns to clinic today for follow-up of her reflux and dysphagia.    05/17/2019 patient seen in the clinic after being evaluated in the ED for dysphagia.  At that time described that she was having trouble swallowing food and even liquid for a while.  Also describes some nausea related to smells.  Scheduled patient for a barium swallow with tablet for further eval of dysphagia.  Stop Zantac and start Omeprazole 20 mg daily.    05/27/2019 patient had a barium esophagram which showed a large hiatal hernia with relatively well-preserved primary peristalsis without evidence of esophageal stricture, mass lesion or diverticulum as well as reflux.  She was scheduled for EGD.    06/11/2019 EGD with normal esophagus, large hiatal hernia with mild erythema, normal stomach, small duodenal diverticulum and otherwise normal.  Pathology showed mild to moderately active chronic gastritis and reflux esophagitis.  Is recommended the patient increase her omeprazole to 40 mg daily.    Today, the patient presents clinic accompanied by her daughter who does assist with history.  They explain that she has been doing very well.  Patient tells me that her taste has come back and she is no longer have any trouble with swallowing or reflux symptoms.  Currently only using Omeprazole 20 mg daily.  Is very excited that she is somewhat back to normal.    Denies fever, chills, weight loss, change in bowel habits, abdominal pain or nausea.  Past Medical History:  Diagnosis Date  . Blood clot in vein   . GERD (gastroesophageal reflux disease)   . H/O echocardiogram 09/05/2009   EF >55%  . High cholesterol   . Hypertension   . Sleep apnea     Past Surgical History:  Procedure Laterality Date  . ABDOMINAL  HYSTERECTOMY    . CARDIAC CATHETERIZATION  01/27/2003  . NM MYOVIEW LTD  8/804   RULE OUT ISCHEMIA  . TUBAL LIGATION      Current Outpatient Medications  Medication Sig Dispense Refill  . apixaban (ELIQUIS) 5 MG TABS tablet 10mg  PO BID for 7 days, then 5mg  PO BID thereafter. 60 tablet 0  . Multiple Vitamins-Minerals (CENTRUM SILVER ADULT 50+) TABS Take 1 tablet by mouth daily with breakfast.    . omeprazole (PRILOSEC) 20 MG capsule Take 1 capsule (20 mg total) by mouth daily. 30 capsule 3  . simvastatin (ZOCOR) 40 MG tablet 1 TABLET ORALLY ONCE A DAY  3  . valsartan-hydrochlorothiazide (DIOVAN-HCT) 160-25 MG per tablet Take 1 tablet by mouth daily with breakfast.    . VITAMIN D, CHOLECALCIFEROL, PO Take 5,000 Units by mouth daily.     No current facility-administered medications for this visit.    Allergies as of 07/08/2019  . (No Known Allergies)    Family History  Problem Relation Age of Onset  . Diabetes Sister   . Colon cancer Neg Hx   . Esophageal cancer Neg Hx   . Rectal cancer Neg Hx   . Stomach cancer Neg Hx     Social History   Socioeconomic History  . Marital status: Single    Spouse name: Not on file  . Number of children: Not on file  . Years of education: Not on file  . Highest education level: Not on file  Occupational History  . Occupation: retired  Tobacco Use  . Smoking status: Never Smoker  . Smokeless tobacco: Never Used  Substance and Sexual Activity  . Alcohol use: No  . Drug use: No  . Sexual activity: Never  Other Topics Concern  . Not on file  Social History Narrative  . Not on file   Social Determinants of Health   Financial Resource Strain:   . Difficulty of Paying Living Expenses: Not on file  Food Insecurity:   . Worried About Programme researcher, broadcasting/film/video in the Last Year: Not on file  . Ran Out of Food in the Last Year: Not on file  Transportation Needs:   . Lack of Transportation (Medical): Not on file  . Lack of Transportation  (Non-Medical): Not on file  Physical Activity:   . Days of Exercise per Week: Not on file  . Minutes of Exercise per Session: Not on file  Stress:   . Feeling of Stress : Not on file  Social Connections:   . Frequency of Communication with Friends and Family: Not on file  . Frequency of Social Gatherings with Friends and Family: Not on file  . Attends Religious Services: Not on file  . Active Member of Clubs or Organizations: Not on file  . Attends Banker Meetings: Not on file  . Marital Status: Not on file  Intimate Partner Violence:   . Fear of Current or Ex-Partner: Not on file  . Emotionally Abused: Not on file  . Physically Abused: Not on file  . Sexually Abused: Not on file    Review of Systems:    Constitutional: No weight loss, fever or chills Cardiovascular: No chest pain   Respiratory: No SOB  Gastrointestinal: See HPI and otherwise negative   Physical Exam:  Vital signs: BP 118/60   Pulse (!) 55   Temp 98.6 F (37 C)   Ht 5\' 4"  (1.626 m)   Wt 142 lb (64.4 kg)   BMI 24.37 kg/m   Constitutional:   Pleasant Elderly AA female appears to be in NAD, Well developed, Well nourished, alert and cooperative Respiratory: Respirations even and unlabored. Lungs clear to auscultation bilaterally.   No wheezes, crackles, or rhonchi.  Cardiovascular: Normal S1, S2. No MRG. Regular rate and rhythm. No peripheral edema, cyanosis or pallor.  Gastrointestinal:  Soft, nondistended, nontender. No rebound or guarding. Normal bowel sounds. No appreciable masses or hepatomegaly. Psychiatric: Demonstrates good judgement and reason without abnormal affect or behaviors.  No recent labs/imaging.  Assessment: 1.  Dysphagia: No further symptoms after switch from Zantac to Omeprazole 20 mg daily 2.  GERD: Recent EGD with signs of mild to moderately active chronic gastritis, patient symptoms are well controlled on omeprazole 20 mg daily 3.  Hiatal hernia: Large, seen on imaging  as well as recent EGD, patient has no symptoms from this currently on Omeprazole  Plan: 1.  Reviewed hiatal hernia.  Discussed this with the patient in detail including use of diagrams in the room. 2.  Patient will increase her Omeprazole to 40 mg (220 mg tabs) once daily for the next 2 weeks due to signs of gastritis at time of EGD (though she had only been on her Omeprazole 20 mg for 2 weeks at that point) and then decrease back to Omeprazole 20 mg daily as this does seem to control her symptoms. 3.  Patient to follow in clinic with as needed in the future.  [place], PA-C [facility] 07/08/2019, 11:14 AM  Cc: Willey Blade, MD

## 2019-07-08 NOTE — Patient Instructions (Addendum)
If you are age 84 or older, your body mass index should be between 23-30. Your Body mass index is 24.37 kg/m. If this is out of the aforementioned range listed, please consider follow up with your Primary Care Provider.  If you are age 66 or younger, your body mass index should be between 19-25. Your Body mass index is 24.37 kg/m. If this is out of the aformentioned range listed, please consider follow up with your Primary Care Provider.   Increase Omeprazole 20 mg to 2 tablets once daily for 2 weeks then decrease back to 1 tablet daily.   Follow up as needed.   It was a pleasure to see you today!  [name], [place]

## 2019-07-09 NOTE — Progress Notes (Signed)
Reviewed and agree with management plans. ? ?Rhonda Vangieson L. Sallyanne Birkhead, MD, MPH  ?

## 2019-07-15 ENCOUNTER — Ambulatory Visit: Payer: Medicare Other | Attending: Internal Medicine

## 2019-07-15 DIAGNOSIS — Z23 Encounter for immunization: Secondary | ICD-10-CM

## 2019-07-15 NOTE — Progress Notes (Signed)
   Covid-19 Vaccination Clinic  Name:  [name]    MRN: [id number] DOB: 09-03-1930  07/15/2019  [name] was observed post Covid-19 immunization for 15 minutes without incident. She was provided with Vaccine Information Sheet and instruction to access the V-Safe system.   [name] was instructed to call 911 with any severe reactions post vaccine: [name] Difficulty breathing  . Swelling of face and throat  . A fast heartbeat  . A bad rash all over body  . Dizziness and weakness   Immunizations Administered    Name Date Dose VIS Date Route   Pfizer COVID-19 Vaccine 07/15/2019 10:19 AM 0.3 mL 04/16/2019 Intramuscular   Manufacturer: [employer], [employer]   Lot: [id number]   NDC: [address]-5

## 2019-07-29 ENCOUNTER — Other Ambulatory Visit: Payer: Self-pay | Admitting: Physician Assistant

## 2019-08-09 ENCOUNTER — Ambulatory Visit: Payer: Medicare Other | Attending: Internal Medicine

## 2019-08-09 DIAGNOSIS — Z23 Encounter for immunization: Secondary | ICD-10-CM

## 2019-08-09 NOTE — Progress Notes (Signed)
   Covid-19 Vaccination Clinic  Name:  [name]    MRN: [id number] DOB: [birth year]  08/09/2019  [name] was observed post Covid-19 immunization for 15 minutes without incident. She was provided with Vaccine Information Sheet and instruction to access the V-Safe system.   [name] was instructed to call 911 with any severe reactions post vaccine: [name] Difficulty breathing  . Swelling of face and throat  . A fast heartbeat  . A bad rash all over body  . Dizziness and weakness   Immunizations Administered    Name Date Dose VIS Date Route   Pfizer COVID-19 Vaccine 08/09/2019  9:42 AM 0.3 mL 04/16/2019 Intramuscular   Manufacturer: [employer], [employer]   Lot: [id number]   NDC: [address]-9

## 2019-10-27 ENCOUNTER — Telehealth: Payer: Self-pay | Admitting: Physician Assistant

## 2019-10-27 NOTE — Telephone Encounter (Signed)
Left message on voicemail indicating that we sent refills of omeprazole 20 mg to the pharmacy on 07/29/19 so there should be refills available by calling the number on her prescription bottle. However, if there are any issues by doing that, to please let us know.

## 2020-01-19 ENCOUNTER — Other Ambulatory Visit: Payer: Self-pay | Admitting: Physician Assistant

## 2020-07-09 ENCOUNTER — Other Ambulatory Visit: Payer: Self-pay | Admitting: Physician Assistant

## 2020-10-10 IMAGING — RF DG ESOPHAGUS
7 series · 12 of 24 positions shown · non-contrast
Comparison: None.

CLINICAL DATA: Dysphagia. Gastroesophageal reflux.

EXAM:
ESOPHOGRAM/BARIUM SWALLOW
TECHNIQUE: Single contrast examination was performed using  barium.
FLUOROSCOPY TIME:  Fluoroscopy Time:  1 minutes and 30 seconds.
Radiation Exposure Index (if provided by the fluoroscopic device):
20 mGy

[Series 1: fluoro_barium 2fps_bw · 0.18mm/px · 1 of 20 frames shown (1 of 2)]
[frame 11/20]
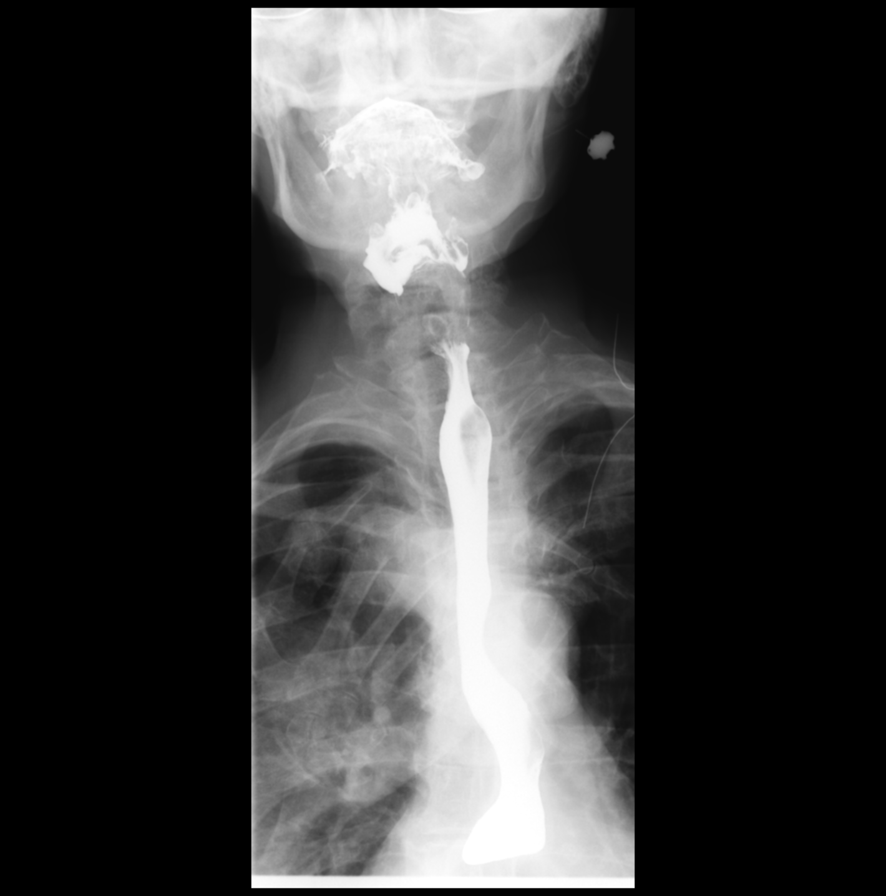

[Series 2: fluoro_barium 2fps_bw · 0.18mm/px · 2 of 20 frames shown (2 of 2)]
[frame 4/20]
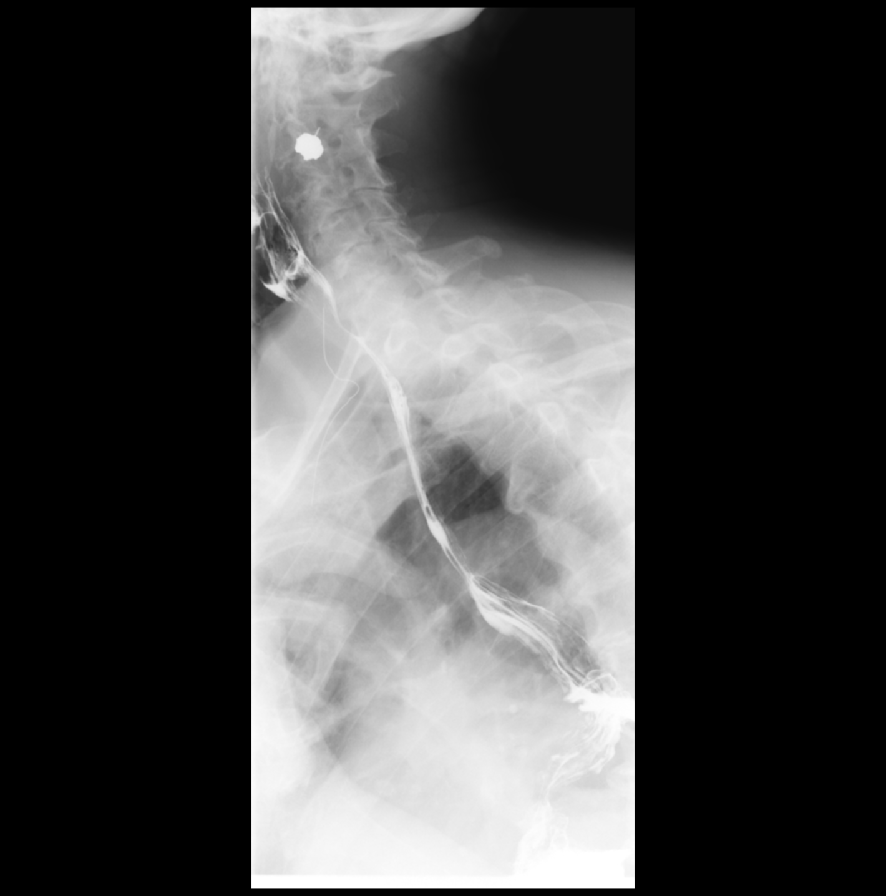
[frame 18/20]
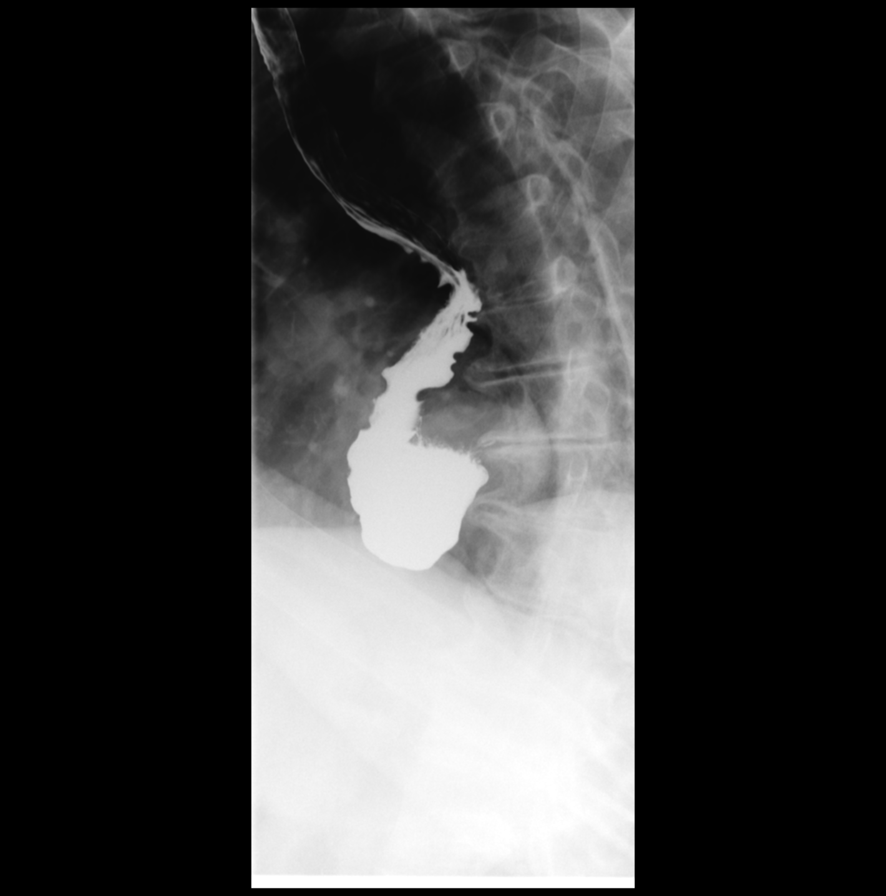

[Series 3: cp_standard · 0.55mm/px · 2 of 22 frames shown (1 of 5)]
[frame 3/22]
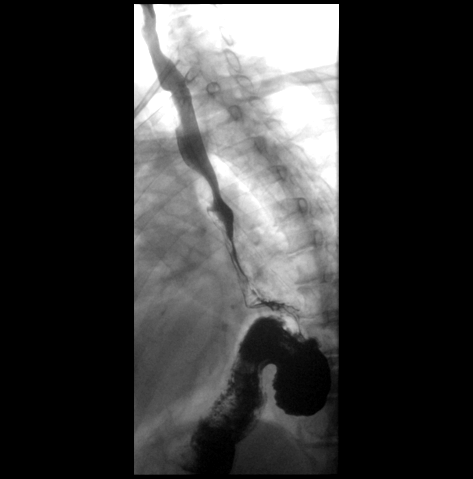
[frame 12/22]
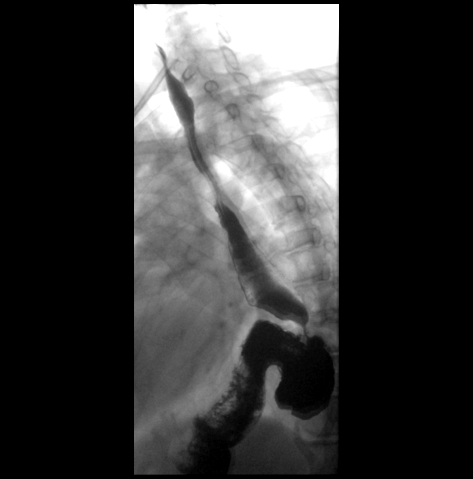

[Series 4: cp_standard · 0.55mm/px · 2 of 21 frames shown (2 of 5)]
[frame 1/21]
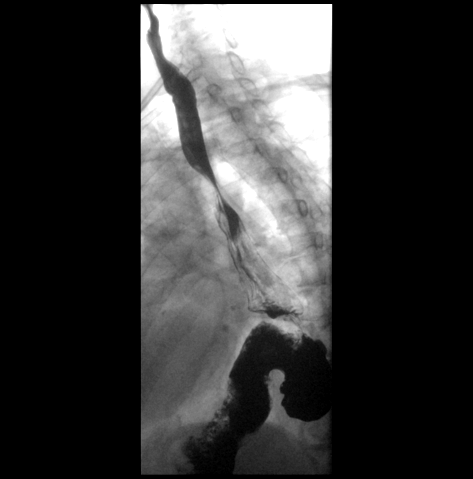
[frame 11/21]
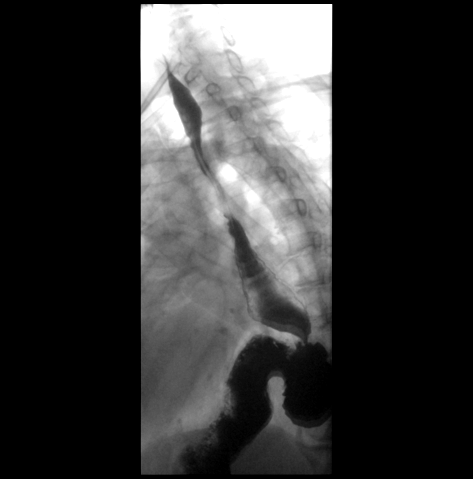

[Series 5: cp_standard · 0.55mm/px · 2 of 35 frames shown (3 of 5)]
[frame 4/35]
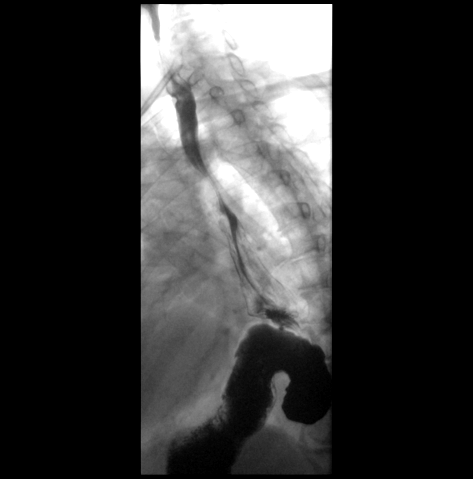
[frame 18/35]
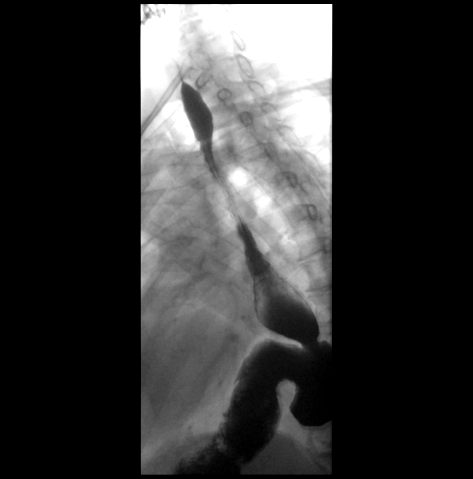

[Series 6: cp_standard · 0.55mm/px · 2 of 40 frames shown (4 of 5)]
[frame 7/40]
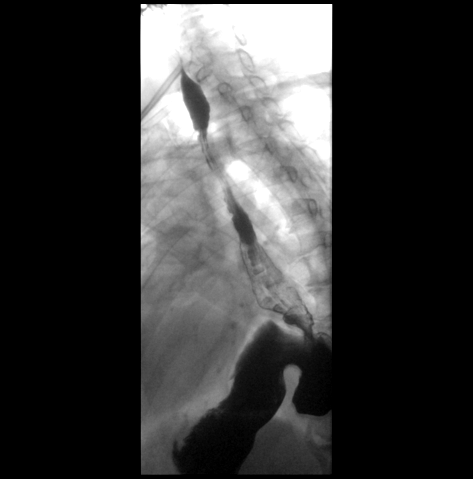
[frame 21/40]
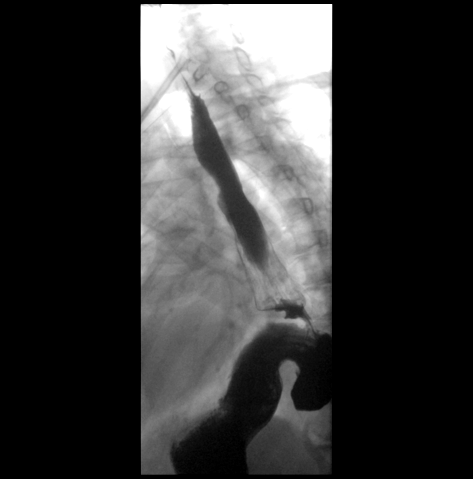

[Series 7: cp_standard · 0.27mm/px · 1 of 1 slices shown (5 of 5)]
[im 1/1]
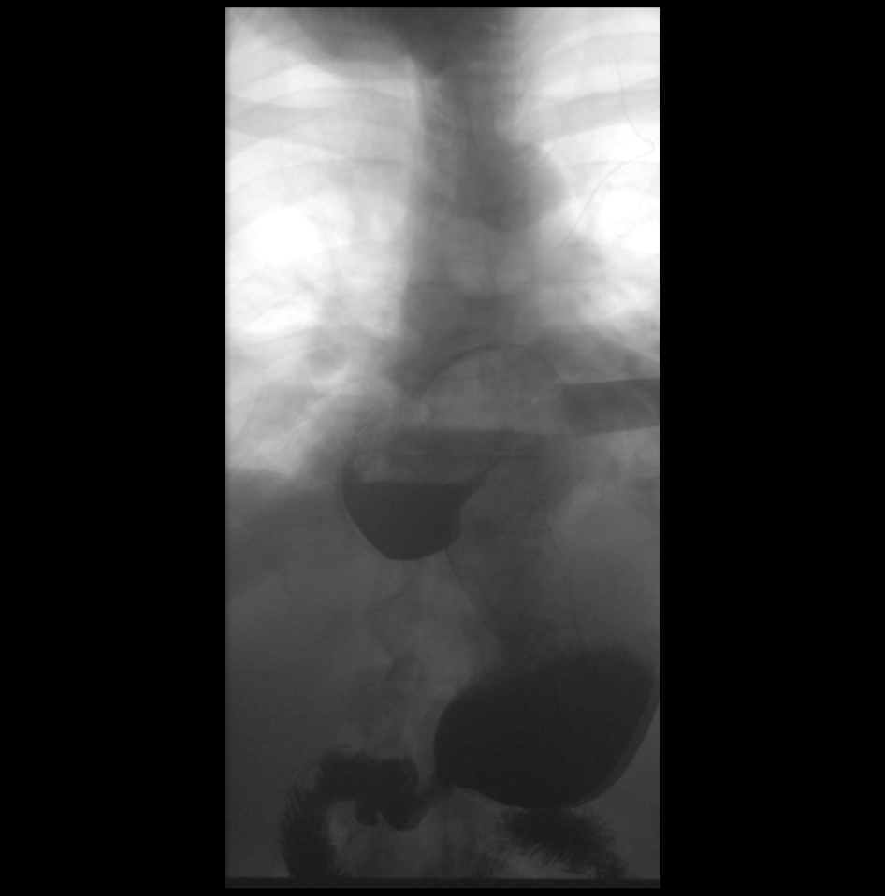

[12 of 24 positions shown; findings below may reference images not displayed]

FINDINGS: Frontal and lateral views of the hypopharynx while swallowing thick
barium are normal. Due to timing for initiation of swallowing,
double contrast imaging was not performed. Single contrast imaging
of the esophagus shows no evidence for stricture, diverticulum or
gross penetrating ulcer. No esophageal mass lesion.

Assessment of esophageal motility shows preservation of primary
peristalsis with proximal escape. Mild tertiary contractions noted
mid esophagus. No features of presbyesophagus.

Patient is noted to have a moderate to large hiatal hernia with
approximately 50% of the stomach contained in the thorax. This is
similar to CT chest from 11/01/2015. An episode of gastroesophageal
reflux was observed during the exam.

13 mm barium tablet passes readily into the stomach when taken with
water.
IMPRESSION: 1. Relatively well preserved primary peristalsis without evidence
for esophageal stricture, mass lesion, or diverticulum.
2. Moderate to large hiatal hernia with approximately 50% of the
stomach in the thorax.
3. Gastroesophageal reflux.

## 2021-02-02 ENCOUNTER — Other Ambulatory Visit: Payer: Self-pay | Admitting: Physician Assistant

## 2021-02-02 NOTE — Telephone Encounter (Signed)
Patient needs office visit.  

## 2021-05-01 ENCOUNTER — Other Ambulatory Visit: Payer: Self-pay | Admitting: Physician Assistant

## 2021-08-02 ENCOUNTER — Other Ambulatory Visit: Payer: Self-pay | Admitting: Physician Assistant

## 2021-08-15 ENCOUNTER — Other Ambulatory Visit: Payer: Self-pay | Admitting: Physician Assistant

## 2021-09-09 ENCOUNTER — Other Ambulatory Visit: Payer: Self-pay | Admitting: Physician Assistant

## 2023-02-01 ENCOUNTER — Observation Stay (HOSPITAL_COMMUNITY)
Admission: EM | Admit: 2023-02-01 | Discharge: 2023-02-05 | Disposition: A | Discharge status: Skilled Nursing Facility | Payer: Medicare HMO | Attending: Internal Medicine | Admitting: Internal Medicine

## 2023-02-01 ENCOUNTER — Encounter (HOSPITAL_COMMUNITY): Payer: Self-pay | Admitting: Radiology

## 2023-02-01 ENCOUNTER — Other Ambulatory Visit: Payer: Self-pay

## 2023-02-01 ENCOUNTER — Emergency Department (HOSPITAL_COMMUNITY): Payer: Medicare HMO

## 2023-02-01 DIAGNOSIS — Y92009 Unspecified place in unspecified non-institutional (private) residence as the place of occurrence of the external cause: Secondary | ICD-10-CM | POA: Diagnosis not present

## 2023-02-01 DIAGNOSIS — I4891 Unspecified atrial fibrillation: Principal | ICD-10-CM

## 2023-02-01 DIAGNOSIS — R296 Repeated falls: Principal | ICD-10-CM | POA: Insufficient documentation

## 2023-02-01 DIAGNOSIS — N179 Acute kidney failure, unspecified: Secondary | ICD-10-CM | POA: Insufficient documentation

## 2023-02-01 DIAGNOSIS — I441 Atrioventricular block, second degree: Secondary | ICD-10-CM | POA: Diagnosis present

## 2023-02-01 DIAGNOSIS — Z86718 Personal history of other venous thrombosis and embolism: Secondary | ICD-10-CM | POA: Diagnosis not present

## 2023-02-01 DIAGNOSIS — R001 Bradycardia, unspecified: Secondary | ICD-10-CM | POA: Diagnosis present

## 2023-02-01 DIAGNOSIS — Z23 Encounter for immunization: Secondary | ICD-10-CM | POA: Diagnosis not present

## 2023-02-01 DIAGNOSIS — E876 Hypokalemia: Secondary | ICD-10-CM | POA: Insufficient documentation

## 2023-02-01 DIAGNOSIS — I10 Essential (primary) hypertension: Secondary | ICD-10-CM | POA: Diagnosis present

## 2023-02-01 DIAGNOSIS — Z7901 Long term (current) use of anticoagulants: Secondary | ICD-10-CM | POA: Diagnosis not present

## 2023-02-01 DIAGNOSIS — N289 Disorder of kidney and ureter, unspecified: Secondary | ICD-10-CM

## 2023-02-01 DIAGNOSIS — Z79899 Other long term (current) drug therapy: Secondary | ICD-10-CM | POA: Diagnosis not present

## 2023-02-01 DIAGNOSIS — R531 Weakness: Secondary | ICD-10-CM | POA: Diagnosis present

## 2023-02-01 DIAGNOSIS — W19XXXA Unspecified fall, initial encounter: Secondary | ICD-10-CM

## 2023-02-01 LAB — BASIC METABOLIC PANEL
Anion gap: 10 (ref 5–15)
BUN: 27 mg/dL — ABNORMAL HIGH (ref 8–23)
CO2: 25 mmol/L (ref 22–32)
Calcium: 9 mg/dL (ref 8.9–10.3)
Chloride: 106 mmol/L (ref 98–111)
Creatinine, Ser: 1.29 mg/dL — ABNORMAL HIGH (ref 0.44–1.00)
GFR, Estimated: 39 mL/min — ABNORMAL LOW (ref >60)
Glucose, Bld: 85 mg/dL (ref 70–99)
Potassium: 3.3 mmol/L — ABNORMAL LOW (ref 3.5–5.1)
Sodium: 141 mmol/L (ref 135–145)

## 2023-02-01 LAB — TROPONIN I (HIGH SENSITIVITY)
Troponin I (High Sensitivity): 14 ng/L (ref <18)
Troponin I (High Sensitivity): 17 ng/L (ref <18)

## 2023-02-01 LAB — URINALYSIS, ROUTINE W REFLEX MICROSCOPIC
Bilirubin Urine: NEGATIVE
Glucose, UA: NEGATIVE mg/dL
Hgb urine dipstick: NEGATIVE
Ketones, ur: NEGATIVE mg/dL
Leukocytes,Ua: NEGATIVE
Nitrite: NEGATIVE
Protein, ur: NEGATIVE mg/dL
Specific Gravity, Urine: 1.012 (ref 1.005–1.030)
pH: 5 (ref 5.0–8.0)

## 2023-02-01 LAB — CBC
HCT: 32.3 % — ABNORMAL LOW (ref 36.0–46.0)
Hemoglobin: 10.4 g/dL — ABNORMAL LOW (ref 12.0–15.0)
MCH: 30.1 pg (ref 26.0–34.0)
MCHC: 32.2 g/dL (ref 30.0–36.0)
MCV: 93.4 fL (ref 80.0–100.0)
Platelets: 213 10*3/uL (ref 150–400)
RBC: 3.46 MIL/uL — ABNORMAL LOW (ref 3.87–5.11)
RDW: 14.7 % (ref 11.5–15.5)
WBC: 6.7 10*3/uL (ref 4.0–10.5)
nRBC: 0 % (ref 0.0–0.2)

## 2023-02-01 LAB — CBG MONITORING, ED: Glucose-Capillary: 72 mg/dL (ref 70–99)

## 2023-02-01 MED ORDER — ACETAMINOPHEN 650 MG RE SUPP: 650.0000 mg | Freq: Four times a day (QID) | RECTAL | Status: DC | PRN

## 2023-02-01 MED ORDER — SIMVASTATIN 20 MG PO TABS
40.0000 mg | ORAL_TABLET | Freq: Every day | ORAL | Status: DC
  Administered 2023-02-02: 40 mg via ORAL
  Filled 2023-02-01: qty 2

## 2023-02-01 MED ORDER — ACETAMINOPHEN 325 MG PO TABS
650.0000 mg | ORAL_TABLET | Freq: Four times a day (QID) | ORAL | Status: DC | PRN
  Administered 2023-02-02 – 2023-02-05 (×4): 650 mg via ORAL
  Filled 2023-02-01 (×4): qty 2

## 2023-02-01 MED ORDER — SENNOSIDES-DOCUSATE SODIUM 8.6-50 MG PO TABS: 1.0000 | ORAL_TABLET | Freq: Every evening | ORAL | Status: DC | PRN

## 2023-02-01 MED ORDER — APIXABAN 5 MG PO TABS
5.0000 mg | ORAL_TABLET | Freq: Two times a day (BID) | ORAL | Status: DC
  Administered 2023-02-02 – 2023-02-05 (×9): 5 mg via ORAL
  Filled 2023-02-01 (×9): qty 1

## 2023-02-01 MED ORDER — POTASSIUM CHLORIDE 20 MEQ PO PACK
40.0000 meq | PACK | Freq: Once | ORAL | Status: AC
  Administered 2023-02-02: 40 meq via ORAL
  Filled 2023-02-01: qty 2

## 2023-02-01 MED ORDER — ONDANSETRON HCL 4 MG PO TABS: 4.0000 mg | ORAL_TABLET | Freq: Four times a day (QID) | ORAL | Status: DC | PRN

## 2023-02-01 MED ORDER — LACTATED RINGERS IV SOLN: INTRAVENOUS | Status: AC

## 2023-02-01 MED ORDER — ONDANSETRON HCL 4 MG/2ML IJ SOLN: 4.0000 mg | Freq: Four times a day (QID) | INTRAMUSCULAR | Status: DC | PRN

## 2023-02-01 MED ORDER — SODIUM CHLORIDE 0.9% FLUSH
3.0000 mL | Freq: Two times a day (BID) | INTRAVENOUS | Status: DC
  Administered 2023-02-01 – 2023-02-05 (×8): 3 mL via INTRAVENOUS

## 2023-02-01 NOTE — ED Notes (Signed)
ED TO INPATIENT HANDOFF REPORT  ED Nurse Name and Phone #: [phone]   S Name/Age/Gender [name] 89 y.o. female Room/Bed: 035C/035C  Code Status   Code Status: Prior  Home/SNF/Other Home Patient oriented to: self, place, time, and situation Is this baseline? Yes   Triage Complete: Triage complete  Chief Complaint Fall at home, initial encounter [W19.XXXA, Y92.009]  Triage Note PT states she woke around 4am not feeling well. Pt describes it as not being able to process as well as she normally can. Pt states she fell once today for sure but it may have been twice. Pt states she remembers her head being on the floor but doesn't think it hit on the floor. She believes she rested it on the flood. PT has no complaints from the fall. Pt states she fell due to weakness. Pt is on eliquis. Family endorses she just seems weaker than she normally is. Pt lives alone. Pt feels like she is processing things better now than this morning. Pt denies eating and drinking like she normally would. PT lips are VERY dry.   Allergies No Known Allergies  Level of Care/Admitting Diagnosis ED Disposition     ED Disposition  Admit   Condition  --   Comment  Hospital Area: [facility] [[id number]]  Level of Care: Progressive [102]  Admit to Progressive based on following criteria: CARDIOVASCULAR & THORACIC of moderate stability with acute coronary syndrome symptoms/low risk myocardial infarction/hypertensive urgency/arrhythmias/heart failure potentially compromising stability and stable post cardiovascular intervention patients.  May place patient in observation at [facility] or [facility] if equivalent level of care is available:: No  Covid Evaluation: Asymptomatic - no recent exposure (last 10 days) testing not required  Diagnosis: Fall at home, initial encounter [phone]  Admitting Physician: Charlsie Quest [[id number]]  Attending Physician: Charlsie Quest [[id number]]           B Medical/Surgery History Past Medical History:  Diagnosis Date   Blood clot in vein    GERD (gastroesophageal reflux disease)    H/O echocardiogram 09/05/2009   EF >55%   High cholesterol    Hypertension    Sleep apnea    Past Surgical History:  Procedure Laterality Date   ABDOMINAL HYSTERECTOMY     CARDIAC CATHETERIZATION  01/27/2003   NM MYOVIEW LTD  8/804   RULE OUT ISCHEMIA   TUBAL LIGATION       A IV Location/Drains/Wounds Patient Lines/Drains/Airways Status     Active Line/Drains/Airways     Name Placement date Placement time Site Days   Peripheral IV 02/01/23 20 G Left Antecubital 02/01/23  1742  Antecubital  less than 1            Intake/Output Last 24 hours No intake or output data in the 24 hours ending 02/01/23 2143  Labs/Imaging Results for orders placed or performed during the hospital encounter of 02/01/23 (from the past 48 hour(s))  Urinalysis, Routine w reflex microscopic -Urine, Clean Catch     Status: Abnormal   Collection Time: 02/01/23  5:26 PM  Result Value Ref Range   Color, Urine YELLOW YELLOW   APPearance HAZY (A) CLEAR   Specific Gravity, Urine 1.012 1.005 - 1.030   pH 5.0 5.0 - 8.0   Glucose, UA NEGATIVE NEGATIVE mg/dL   Hgb urine dipstick NEGATIVE NEGATIVE   Bilirubin Urine NEGATIVE NEGATIVE   Ketones, ur NEGATIVE NEGATIVE mg/dL   Protein, ur NEGATIVE NEGATIVE mg/dL   Nitrite NEGATIVE  NEGATIVE   Leukocytes,Ua NEGATIVE NEGATIVE    Comment: Performed at [facility], [address] [place], [place] [address]  Basic metabolic panel     Status: Abnormal   Collection Time: 02/01/23  5:35 PM  Result Value Ref Range   Sodium 141 135 - 145 mmol/L   Potassium 3.3 (L) 3.5 - 5.1 mmol/L   Chloride 106 98 - 111 mmol/L   CO2 25 22 - 32 mmol/L   Glucose, Bld 85 70 - 99 mg/dL    Comment: Glucose reference range applies only to samples taken after fasting for at least 8 hours.   BUN 27 (H) 8 - 23 mg/dL   Creatinine, Ser 1.02 (H)  0.44 - 1.00 mg/dL   Calcium 9.0 8.9 - 72.5 mg/dL   GFR, Estimated 39 (L) >60 mL/min    Comment: (NOTE) Calculated using the CKD-EPI Creatinine Equation (2021)    Anion gap 10 5 - 15    Comment: Performed at [facility], [address] [place], [place] [address]  CBC     Status: Abnormal   Collection Time: 02/01/23  5:35 PM  Result Value Ref Range   WBC 6.7 4.0 - 10.5 K/uL   RBC 3.46 (L) 3.87 - 5.11 MIL/uL   Hemoglobin 10.4 (L) 12.0 - 15.0 g/dL   HCT 03.4 (L) 74.2 - 59.5 %   MCV 93.4 80.0 - 100.0 fL   MCH 30.1 26.0 - 34.0 pg   MCHC 32.2 30.0 - 36.0 g/dL   RDW 63.8 75.6 - 43.3 %   Platelets 213 150 - 400 K/uL   nRBC 0.0 0.0 - 0.2 %    Comment: Performed at [facility], [address] [place], [place] [address]  Troponin I (High Sensitivity)     Status: None   Collection Time: 02/01/23  5:35 PM  Result Value Ref Range   Troponin I (High Sensitivity) 14 <18 ng/L    Comment: (NOTE) Elevated high sensitivity troponin I (hsTnI) values and significant  changes across serial measurements may suggest ACS but many other  chronic and acute conditions are known to elevate hsTnI results.  Refer to the "Links" section for chest pain algorithms and additional  guidance. Performed at [facility], [address] [place], [place] [address]   CBG monitoring, ED     Status: None   Collection Time: 02/01/23  5:40 PM  Result Value Ref Range   Glucose-Capillary 72 70 - 99 mg/dL    Comment: Glucose reference range applies only to samples taken after fasting for at least 8 hours.  Troponin I (High Sensitivity)     Status: None   Collection Time: 02/01/23  7:42 PM  Result Value Ref Range   Troponin I (High Sensitivity) 17 <18 ng/L    Comment: (NOTE) Elevated high sensitivity troponin I (hsTnI) values and significant  changes across serial measurements may suggest ACS but many other  chronic and acute conditions are known to elevate hsTnI results.  Refer to the "Links"  section for chest pain algorithms and additional  guidance. Performed at [facility], [address], [place], [place] [address]    CT Head Wo Contrast  Result Date: 02/01/2023 CLINICAL DATA:  Neuro deficit, acute, stroke suspected.  Weakness. EXAM: CT HEAD WITHOUT CONTRAST TECHNIQUE: Contiguous axial images were obtained from the base of the skull through the vertex without intravenous contrast. RADIATION DOSE REDUCTION: This exam was performed according to the  departmental dose-optimization program which includes automated exposure control, adjustment of the mA and/or kV according to patient size and/or use of iterative reconstruction technique. COMPARISON:  None Available. FINDINGS: Brain: There is atrophy and chronic small vessel disease changes. No acute intracranial abnormality. Specifically, no hemorrhage, hydrocephalus, mass lesion, acute infarction, or significant intracranial injury. Vascular: No hyperdense vessel or unexpected calcification. Skull: No acute calvarial abnormality. Sinuses/Orbits: No acute findings Other: None IMPRESSION: Atrophy, chronic microvascular disease. No acute intracranial abnormality. Electronically Signed   By: Charlett Nose M.D.   On: 02/01/2023 19:12    Pending Labs Unresulted Labs (From admission, onward)     Start     Ordered   02/01/23 2135  Magnesium  Add-on,   AD        02/01/23 2134            Vitals/Pain Today's Vitals   02/01/23 1723 02/01/23 1930 02/01/23 2100  BP: (!) 176/72 (!) 156/67 (!) 122/45  Pulse: (!) 51 (!) 51 (!) 41  Resp: 16 18 14   Temp: 98 F (36.7 C) 98.1 F (36.7 C)   TempSrc: Oral Oral   SpO2: 100% 100% 97%  Weight: 142 lb (64.4 kg)    Height: 5\' 4"  (1.626 m)    PainSc:  0-No pain     Isolation Precautions No active isolations  Medications Medications  potassium chloride (KLOR-CON) packet 40 mEq (has no administration in time range)  lactated ringers infusion (has no administration in time range)     Mobility walks with device     Focused Assessments     R Recommendations: See Admitting Provider Note  Report given to:   Additional Notes:

## 2023-02-01 NOTE — Hospital Course (Signed)
[name] is a 89 y.o. 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

## 2023-02-01 NOTE — ED Provider Notes (Signed)
[place] [facility] DEPARTMENT AT [facility] Provider Note   CSN: [id number] Arrival date & time: 02/01/23  1707     History  Chief Complaint  Patient presents with   Weakness    COBIE LEIDNER is a 89 y.o. female.   Weakness    Pt states this am she was getting out of bed and fell.  SHe is not sure why.  SHe may have mistepped.  Pt states she has not felt exactly well today.  Pt is able to walk to the bathroom but she is feeling very unsteady and week.  Pt denies any head injury.  SHe does not think she hit her head.  NO headaches.  No vomiting or diarrhea.  No fever or coughing.   Home Medications Prior to Admission medications   Medication Sig Start Date End Date Taking? Authorizing Provider  apixaban (ELIQUIS) 5 MG TABS tablet 10mg  PO BID for 7 days, then 5mg  PO BID thereafter. 11/02/15   Kirby Crigler, Mir M, MD  Multiple Vitamins-Minerals (CENTRUM SILVER ADULT 50+) TABS Take 1 tablet by mouth daily with breakfast.    [provider]  omeprazole (PRILOSEC) 20 MG capsule 1 capsule by mouth every day PT NEEDS AN APPOINTMENT 08/02/21   Unk Lightning, PA  simvastatin (ZOCOR) 40 MG tablet 1 TABLET ORALLY ONCE A DAY 11/05/14   [provider]  valsartan-hydrochlorothiazide (DIOVAN-HCT) 160-25 MG per tablet Take 1 tablet by mouth daily with breakfast.    [provider]  VITAMIN D, CHOLECALCIFEROL, PO Take 5,000 Units by mouth daily.    [provider]      Allergies    Patient has no known allergies.    Review of Systems   Review of Systems  Neurological:  Positive for weakness.    Physical Exam Updated Vital Signs BP (!) 156/67   Pulse (!) 51   Temp 98.1 F (36.7 C) (Oral)   Resp 18   Ht 1.626 m (5\' 4" )   Wt 64.4 kg   SpO2 100%   BMI 24.37 kg/m  Physical Exam Vitals and nursing note reviewed.  Constitutional:      General: She is not in acute distress.    Appearance: She is well-developed.  HENT:     Head:  Normocephalic and atraumatic.     Right Ear: External ear normal.     Left Ear: External ear normal.  Eyes:     General: No scleral icterus.       Right eye: No discharge.        Left eye: No discharge.     Conjunctiva/sclera: Conjunctivae normal.  Neck:     Trachea: No tracheal deviation.  Cardiovascular:     Rate and Rhythm: Normal rate and regular rhythm.  Pulmonary:     Effort: Pulmonary effort is normal. No respiratory distress.     Breath sounds: Normal breath sounds. No stridor. No wheezing or rales.  Abdominal:     General: Bowel sounds are normal. There is no distension.     Palpations: Abdomen is soft.     Tenderness: There is no abdominal tenderness. There is no guarding or rebound.  Musculoskeletal:        General: No tenderness or deformity.     Cervical back: Neck supple.     Comments: Rheumatoid arthritis changes noted in the hands  Skin:    General: Skin is warm and dry.     Findings: No rash.  Neurological:  General: No focal deficit present.     Mental Status: She is alert.     Cranial Nerves: No cranial nerve deficit, dysarthria or facial asymmetry.     Sensory: No sensory deficit.     Motor: No abnormal muscle tone or seizure activity.     Coordination: Coordination normal.     Comments: Equal grip strength bilaterally able to lift both legs off the bed, normal finger-nose exam  Psychiatric:        Mood and Affect: Mood normal.     ED Results / Procedures / Treatments   Labs (all labs ordered are listed, but only abnormal results are displayed) Labs Reviewed  BASIC METABOLIC PANEL - Abnormal; Notable for the following components:      Result Value   Potassium 3.3 (*)    BUN 27 (*)    Creatinine, Ser 1.29 (*)    GFR, Estimated 39 (*)    All other components within normal limits  CBC - Abnormal; Notable for the following components:   RBC 3.46 (*)    Hemoglobin 10.4 (*)    HCT 32.3 (*)    All other components within normal limits   URINALYSIS, ROUTINE W REFLEX MICROSCOPIC - Abnormal; Notable for the following components:   APPearance HAZY (*)    All other components within normal limits  CBG MONITORING, ED  TROPONIN I (HIGH SENSITIVITY)  TROPONIN I (HIGH SENSITIVITY)    EKG EKG Interpretation Date/Time:  Saturday February 01 2023 17:17:52 EDT Ventricular Rate:  49 PR Interval:  71 QRS Duration:  108 QT Interval:  456 QTC Calculation: 412 R Axis:   -9  Text Interpretation: possible  Wandering atrial pacemaker Low voltage, extremity leads Consider anterior infarct Confirmed by Linwood Dibbles [phone]) on 02/01/2023 5:20:45 PM  Radiology CT Head Wo Contrast  Result Date: 02/01/2023 CLINICAL DATA:  Neuro deficit, acute, stroke suspected.  Weakness. EXAM: CT HEAD WITHOUT CONTRAST TECHNIQUE: Contiguous axial images were obtained from the base of the skull through the vertex without intravenous contrast. RADIATION DOSE REDUCTION: This exam was performed according to the departmental dose-optimization program which includes automated exposure control, adjustment of the mA and/or kV according to patient size and/or use of iterative reconstruction technique. COMPARISON:  None Available. FINDINGS: Brain: There is atrophy and chronic small vessel disease changes. No acute intracranial abnormality. Specifically, no hemorrhage, hydrocephalus, mass lesion, acute infarction, or significant intracranial injury. Vascular: No hyperdense vessel or unexpected calcification. Skull: No acute calvarial abnormality. Sinuses/Orbits: No acute findings Other: None IMPRESSION: Atrophy, chronic microvascular disease. No acute intracranial abnormality. Electronically Signed   By: Charlett Nose M.D.   On: 02/01/2023 19:12    Procedures Procedures    Medications Ordered in ED Medications - No data to display  ED Course/ Medical Decision Making/ A&P Clinical Course as of 02/01/23 2056  Sat Feb 01, 2023  2026 CBC shows stable anemia [JK]  2026  Basic metabolic panel(!) Metabolic panel shows creatinine slightly increased at 1.29.  Urinalysis without signs of infection [JK]  2027 Head CT without acute findings [JK]  2055 Case discussed with Dr Allena Katz regarding admission [JK]    Clinical Course User Index [JK] Linwood Dibbles, MD                                 Medical Decision Making Problems Addressed: Atrial fibrillation with slow ventricular response [facility]): chronic illness or injury with exacerbation, progression, or  side effects of treatment  Amount and/or Complexity of Data Reviewed Labs: ordered. Decision-making details documented in ED Course. Radiology: ordered.  Risk Decision regarding hospitalization.   Patient presented to the ED for evaluation of a syncopal episode.  In the ED patient noted to have bradycardia.  EMS was also noted episodes of bradycardia that were even more pronounced while she was at home.  No focal deficits noted on neuroexam.  Head CT does not show any acute findings.  Doubt acute stroke.  No signs of severe anemia or electrolyte abnormalities, dehydration to account for her syncope and weakness.  Suspect her bradycardia could be contributing to the syncopal episode she had earlier.  EKG not suggestive of complete heart block.  I will consult with cardiology and medical service for admission          Final Clinical Impression(s) / ED Diagnoses Final diagnoses:  Atrial fibrillation with slow ventricular response (HCC)  Bradycardia    Rx / DC Orders ED Discharge Orders     None         Linwood Dibbles, MD 02/01/23 2056

## 2023-02-01 NOTE — H&P (Signed)
History and Physical    [name]:[id number] DOB: 1931/02/14 DOA: 02/01/2023  PCP: Andi Devon, MD  Patient coming from: Home  I have personally briefly reviewed patient's old medical records in [facility]  Chief Complaint: Fall  HPI: [name] is a 89 y.o. 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  Patient lives alone but has family frequently checking in on her.  She says this morning around 4 AM she was attempted to get out of bed when she felt weak and lost coordination.  She fell to the ground.  She denied hitting her head or loss of consciousness or any significant injury.  She did report feeling some lightheadedness.  When family came in to check on her later in the day they noticed that she seemed to be a little bit confused.  She is usually very coherent and interactive at baseline although has had some recent memory issues.  They noticed that she had difficulty walking.  She has been using a cane recently to help with ambulation.  She was treated for UTI a few weeks ago but denies any other significant changes in her medications.  She is on chronic anticoagulation with Eliquis due to history of recurrent DVT.  She denies any obvious bleeding, chest pain, dyspnea, nausea, vomit, abdominal pain, dysuria.  She reports chronic issues with phlebitis in her legs and wears compression stockings regularly.  ED Course  Labs/Imaging on admission: I have personally reviewed following labs and imaging studies.  Initial vitals showed BP 176/72, pulse 54, RR 16, temp 98.0 F, SpO2 100% on room air.  Labs show sodium 141, potassium 3.3, bicarb 25, BUN 27, creatinine 1.29, serum glucose 85, WBC 6.7, hemoglobin 10.4, platelets 213,000, troponin negative x 2.  CT head without contrast negative for acute intracranial  abnormality.  Atrophy, chronic microvascular disease noted.  EDP discussed with cardiology for consultation.  The hospitalist service was consulted to admit for further evaluation and management.  Review of Systems: All systems reviewed and are negative except as documented in history of present illness above.   Past Medical History:  Diagnosis Date   Blood clot in vein    GERD (gastroesophageal reflux disease)    H/O echocardiogram 09/05/2009   EF >55%   High cholesterol    Hypertension    Sleep apnea     Past Surgical History:  Procedure Laterality Date   ABDOMINAL HYSTERECTOMY     CARDIAC CATHETERIZATION  01/27/2003   NM MYOVIEW LTD  8/804   RULE OUT ISCHEMIA   TUBAL LIGATION      Social History:  reports that she has never smoked. She has never used smokeless tobacco. She reports that she does not drink alcohol and does not use drugs.  No Known Allergies  Family History  Problem Relation Age of Onset   Diabetes Sister    Colon cancer Neg Hx    Esophageal cancer Neg Hx    Rectal cancer Neg Hx    Stomach cancer Neg Hx      Prior to Admission medications   Medication Sig Start Date End Date Taking? Authorizing Provider  apixaban (ELIQUIS) 5 MG TABS tablet 10mg  PO BID for 7 days, then 5mg  PO BID thereafter. 11/02/15   Kirby Crigler, Mir M, MD  Multiple Vitamins-Minerals (CENTRUM SILVER ADULT 50+) TABS Take 1  tablet by mouth daily with breakfast.    [provider]  omeprazole (PRILOSEC) 20 MG capsule 1 capsule by mouth every day PT NEEDS AN APPOINTMENT 08/02/21   Unk Lightning, PA  simvastatin (ZOCOR) 40 MG tablet 1 TABLET ORALLY ONCE A DAY 11/05/14   [provider]  valsartan-hydrochlorothiazide (DIOVAN-HCT) 160-25 MG per tablet Take 1 tablet by mouth daily with breakfast.    [provider]  VITAMIN D, CHOLECALCIFEROL, PO Take 5,000 Units by mouth daily.    [provider]    Physical Exam: Vitals:   02/01/23 1723 02/01/23  1930 02/01/23 2100  BP: (!) 176/72 (!) 156/67 (!) 122/45  Pulse: (!) 51 (!) 51 (!) 41  Resp: 16 18 14   Temp: 98 F (36.7 C) 98.1 F (36.7 C)   TempSrc: Oral Oral   SpO2: 100% 100% 97%  Weight: 64.4 kg    Height: 5\' 4"  (1.626 m)     Constitutional: Resting supine in bed, NAD, calm, comfortable Eyes: EOMI, lids and conjunctivae normal ENMT: Mucous membranes are moist. Posterior pharynx clear of any exudate or lesions.Normal dentition.  Neck: normal, supple, no masses. Respiratory: clear to auscultation bilaterally, no wheezing, no crackles. Normal respiratory effort. No accessory muscle use.  Cardiovascular: Bradycardic, no murmurs / rubs / gallops. No extremity edema. 2+ pedal pulses. Abdomen: no tenderness, no masses palpated.  Musculoskeletal: no clubbing / cyanosis. No joint deformity upper and lower extremities. Good ROM, no contractures. Normal muscle tone.  Skin: Closed callus on left plantar heel. Neurologic: CN 2-12 grossly intact. Sensation intact. Strength 5/5 in all 4.  Psychiatric: Normal judgment and insight. Alert and oriented x 3. Normal mood.   EKG: Personally reviewed. Sinus bradyarrhythmia with variable PR interval, rate 49.  Previous EKG 03/2019 showed sinus bradycardia with first-degree AV block, rate 50, low voltage.  Assessment/Plan Principal Problem:   Fall at home, initial encounter Active Problems:   Mobitz type 1 second degree AV block   Essential hypertension   Bradycardia   Hypokalemia   Renal insufficiency   History of DVT (deep vein thrombosis)   [name] is a 89 y.o. 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  Assessment and Plan: Bradycardia: Patient with known history of sinus bradycardia with first-degree AV block.  Telemetry on admission shows Mobitz  type I AV block.  Heart rate mostly 40s-50s, but occasionally down to 30s.  Question whether symptomatic bradycardia was contributing to her fall, lightheadedness, confusion earlier in the day.  She is not having any beta-blocker or calcium channel blocker. -EDP has already requested cardiology consult -Keep on telemetry -Replete potassium -Obtain echocardiogram  Hypokalemia: Replete.  Check magnesium and supplement as necessary.  Fall at home: Unclear whether related to symptomatic bradycardia versus mechanical fall.  No injury.  CT head negative.  No focal deficits on admission. -PT/OT eval, fall precautions  Renal insufficiency: Creatinine 1.29 on admission, 1.58 in 2021.  May be AKI versus chronic kidney disease. -Gentle IV fluid hydration overnight -Hold olmesartan-HCTZ -Repeat labs in a.m.  Hypertension: BP is stable.  Holding olmesartan-HCTZ as above.  History of DVT: Continue Eliquis.  Hyperlipidemia: Continue simvastatin.   DVT prophylaxis:  apixaban (ELIQUIS) tablet 5 mg   Code Status:   Code Status: Limited: Do not attempt resuscitation (DNR) -DNR-LIMITED -Do Not Intubate/DNI confirmed with  patient on admission.  Daughter at bedside. Family Communication: Daughter at bedside Disposition Plan: From home, dispo pending clinical progress. Consults called: Cardiology consult pending Severity of Illness: The appropriate patient status for this patient is OBSERVATION. Observation status is judged to be reasonable and necessary in order to provide the required intensity of service to ensure the patient's safety. The patient's presenting symptoms, physical exam findings, and initial radiographic and laboratory data in the context of their medical condition is felt to place them at decreased risk for further clinical deterioration. Furthermore, it is anticipated that the patient will be medically stable for discharge from the hospital within 2 midnights of admission.   Darreld Mclean MD [facility]  If 7PM-7AM, please contact night-coverage [email or web address]  02/01/2023, 10:52 PM

## 2023-02-01 NOTE — ED Triage Notes (Addendum)
PT states she woke around 4am not feeling well. Pt describes it as not being able to process as well as she normally can. Pt states she fell once today for sure but it may have been twice. Pt states she remembers her head being on the floor but doesn't think it hit on the floor. She believes she rested it on the flood. PT has no complaints from the fall. Pt states she fell due to weakness. Pt is on eliquis. Family endorses she just seems weaker than she normally is. Pt lives alone. Pt feels like she is processing things better now than this morning. Pt denies eating and drinking like she normally would. PT lips are VERY dry.

## 2023-02-01 NOTE — ED Notes (Signed)
Merlyn Albert RN [phone]

## 2023-02-02 ENCOUNTER — Observation Stay (HOSPITAL_BASED_OUTPATIENT_CLINIC_OR_DEPARTMENT_OTHER): Payer: Medicare HMO

## 2023-02-02 DIAGNOSIS — R296 Repeated falls: Secondary | ICD-10-CM | POA: Diagnosis not present

## 2023-02-02 DIAGNOSIS — W19XXXA Unspecified fall, initial encounter: Secondary | ICD-10-CM

## 2023-02-02 DIAGNOSIS — Y92009 Unspecified place in unspecified non-institutional (private) residence as the place of occurrence of the external cause: Secondary | ICD-10-CM

## 2023-02-02 DIAGNOSIS — R001 Bradycardia, unspecified: Secondary | ICD-10-CM | POA: Diagnosis not present

## 2023-02-02 DIAGNOSIS — I441 Atrioventricular block, second degree: Secondary | ICD-10-CM

## 2023-02-02 LAB — ECHOCARDIOGRAM COMPLETE
AR max vel: 2.42 cm2
AV Peak grad: 5.4 mm[Hg]
Ao pk vel: 1.16 m/s
Area-P 1/2: 4.89 cm2
Height: 64 in
MV M vel: 2.54 m/s
MV Peak grad: 25.7 mm[Hg]
S' Lateral: 2.5 cm
Weight: 2056.45 [oz_av]

## 2023-02-02 LAB — BASIC METABOLIC PANEL
Anion gap: 6 (ref 5–15)
BUN: 24 mg/dL — ABNORMAL HIGH (ref 8–23)
CO2: 24 mmol/L (ref 22–32)
Calcium: 8.7 mg/dL — ABNORMAL LOW (ref 8.9–10.3)
Chloride: 110 mmol/L (ref 98–111)
Creatinine, Ser: 1.17 mg/dL — ABNORMAL HIGH (ref 0.44–1.00)
GFR, Estimated: 44 mL/min — ABNORMAL LOW (ref >60)
Glucose, Bld: 94 mg/dL (ref 70–99)
Potassium: 4.2 mmol/L (ref 3.5–5.1)
Sodium: 140 mmol/L (ref 135–145)

## 2023-02-02 LAB — CBC
HCT: 28.5 % — ABNORMAL LOW (ref 36.0–46.0)
Hemoglobin: 9.4 g/dL — ABNORMAL LOW (ref 12.0–15.0)
MCH: 30.2 pg (ref 26.0–34.0)
MCHC: 33 g/dL (ref 30.0–36.0)
MCV: 91.6 fL (ref 80.0–100.0)
Platelets: 184 10*3/uL (ref 150–400)
RBC: 3.11 MIL/uL — ABNORMAL LOW (ref 3.87–5.11)
RDW: 14.6 % (ref 11.5–15.5)
WBC: 5.9 10*3/uL (ref 4.0–10.5)
nRBC: 0 % (ref 0.0–0.2)

## 2023-02-02 LAB — MAGNESIUM
Magnesium: 1.7 mg/dL (ref 1.7–2.4)
Magnesium: 1.6 mg/dL — ABNORMAL LOW (ref 1.7–2.4)

## 2023-02-02 MED ORDER — AMLODIPINE BESYLATE 2.5 MG PO TABS
2.5000 mg | ORAL_TABLET | Freq: Every day | ORAL | Status: DC
  Administered 2023-02-02 – 2023-02-05 (×4): 2.5 mg via ORAL
  Filled 2023-02-02 (×4): qty 1

## 2023-02-02 MED ORDER — HYDROCHLOROTHIAZIDE 12.5 MG PO TABS
12.5000 mg | ORAL_TABLET | Freq: Every day | ORAL | Status: DC
  Administered 2023-02-02 – 2023-02-05 (×4): 12.5 mg via ORAL
  Filled 2023-02-02 (×4): qty 1

## 2023-02-02 MED ORDER — OLMESARTAN MEDOXOMIL-HCTZ 20-12.5 MG PO TABS: 1.0000 | ORAL_TABLET | Freq: Every day | ORAL | Status: DC

## 2023-02-02 MED ORDER — MAGNESIUM SULFATE 2 GM/50ML IV SOLN
2.0000 g | Freq: Once | INTRAVENOUS | Status: AC
  Administered 2023-02-02: 2 g via INTRAVENOUS
  Filled 2023-02-02: qty 50

## 2023-02-02 MED ORDER — IRBESARTAN 300 MG PO TABS
150.0000 mg | ORAL_TABLET | Freq: Every day | ORAL | Status: DC
  Administered 2023-02-03 – 2023-02-05 (×3): 150 mg via ORAL
  Filled 2023-02-02 (×3): qty 1

## 2023-02-02 MED ORDER — INFLUENZA VAC A&B SURF ANT ADJ 0.5 ML IM SUSY
0.5000 mL | PREFILLED_SYRINGE | INTRAMUSCULAR | Status: AC
  Administered 2023-02-04: 0.5 mL via INTRAMUSCULAR
  Filled 2023-02-02: qty 0.5

## 2023-02-02 NOTE — Progress Notes (Signed)
Rounding Note    Patient Name: [name] Date of Encounter: 02/02/2023  [facility] Cardiologist: None   Subjective   Feeling well this AM  Inpatient Medications    Scheduled Meds:  apixaban  5 mg Oral BID   simvastatin  40 mg Oral q1800   sodium chloride flush  3 mL Intravenous Q12H   Continuous Infusions:  magnesium sulfate bolus IVPB     PRN Meds: acetaminophen **OR** acetaminophen, ondansetron **OR** ondansetron (ZOFRAN) IV, senna-docusate   Vital Signs    Vitals:   02/01/23 2100 02/01/23 2305 02/02/23 0528 02/02/23 0733  BP: (!) 122/45 (!) 140/68 (!) 130/59 (!) 146/57  Pulse: (!) 41 62 (!) 44 (!) 53  Resp: 14 15 17 15   Temp:  99 F (37.2 C) 98.5 F (36.9 C) 98.5 F (36.9 C)  TempSrc:  Oral Oral Oral  SpO2: 97% 100% 98% 96%  Weight:  58.3 kg 58.3 kg   Height:  5\' 4"  (1.626 m)      Intake/Output Summary (Last 24 hours) at 02/02/2023 0910 Last data filed at 02/02/2023 0600 Gross per 24 hour  Intake 746.38 ml  Output 200 ml  Net 546.38 ml      02/02/2023    5:28 AM 02/01/2023   11:05 PM 02/01/2023    5:23 PM  Last 3 Weights  Weight (lbs) 128 lb 8.5 oz 128 lb 8.5 oz 142 lb  Weight (kg) 58.3 kg 58.3 kg 64.411 kg      Telemetry    Wenckebach, no significant pauses - Personally Reviewed  ECG    No new - Personally Reviewed  Physical Exam   Vitals:   02/02/23 0528 02/02/23 0733  BP: (!) 130/59 (!) 146/57  Pulse: (!) 44 (!) 53  Resp: 17 15  Temp: 98.5 F (36.9 C) 98.5 F (36.9 C)  SpO2: 98% 96%    GEN: No acute distress.   Neck: No JVD Cardiac: RRR, no murmurs, rubs, or gallops.  Respiratory: Clear to auscultation bilaterally. GI: Soft, nontender, non-distended  MS: No edema; No deformity. Neuro:  Nonfocal  Psych: Normal affect   Labs    High Sensitivity Troponin:   Recent Labs  Lab 02/01/23 1735 02/01/23 1942  TROPONINIHS 14 17     Chemistry Recent Labs  Lab 02/01/23 1735 02/01/23 2328 02/02/23 0243  NA  141  --  140  K 3.3*  --  4.2  CL 106  --  110  CO2 25  --  24  GLUCOSE 85  --  94  BUN 27*  --  24*  CREATININE 1.29*  --  1.17*  CALCIUM 9.0  --  8.7*  MG  --  1.7 1.6*  GFRNONAA 39*  --  44*  ANIONGAP 10  --  6    Lipids No results for input(s): "CHOL", "TRIG", "HDL", "LABVLDL", "LDLCALC", "CHOLHDL" in the last 168 hours.  Hematology Recent Labs  Lab 02/01/23 1735 02/02/23 0243  WBC 6.7 5.9  RBC 3.46* 3.11*  HGB 10.4* 9.4*  HCT 32.3* 28.5*  MCV 93.4 91.6  MCH 30.1 30.2  MCHC 32.2 33.0  RDW 14.7 14.6  PLT 213 184   Thyroid No results for input(s): "TSH", "FREET4" in the last 168 hours.  BNPNo results for input(s): "BNP", "PROBNP" in the last 168 hours.  DDimer No results for input(s): "DDIMER" in the last 168 hours.   Radiology    CT Head Wo Contrast  Result Date: 02/01/2023 CLINICAL DATA:  Neuro deficit, acute, stroke suspected.  Weakness. EXAM: CT HEAD WITHOUT CONTRAST TECHNIQUE: Contiguous axial images were obtained from the base of the skull through the vertex without intravenous contrast. RADIATION DOSE REDUCTION: This exam was performed according to the departmental dose-optimization program which includes automated exposure control, adjustment of the mA and/or kV according to patient size and/or use of iterative reconstruction technique. COMPARISON:  None Available. FINDINGS: Brain: There is atrophy and chronic small vessel disease changes. No acute intracranial abnormality. Specifically, no hemorrhage, hydrocephalus, mass lesion, acute infarction, or significant intracranial injury. Vascular: No hyperdense vessel or unexpected calcification. Skull: No acute calvarial abnormality. Sinuses/Orbits: No acute findings Other: None IMPRESSION: Atrophy, chronic microvascular disease. No acute intracranial abnormality. Electronically Signed   By: Charlett Nose M.D.   On: 02/01/2023 19:12    Cardiac Studies   Pending echo  Patient Profile     89 y.o. female with a  significant past medical history of recurrent deep vein thrombosis on oral anticoagulation, hypertension, hypercholesteremia, and gastroesophageal reflux disease. She comes in after falling out of bed. No presyncopal symptoms. Suspect mechanical fall. Rhythm has been 2nd degree mobitz I  Assessment & Plan    Wenckebach No obvious reversible etiology and likely age related cardiac conduction changes.  Recent fall unlikely due to 2nd degree AV block given patient describes a mechanical fall without dizziness, lightheadedness, or syncope. - voltage is low, atrial rhythm can be challenging. Still appears to be wenckebach - no indication for PPM -Avoid chronotropic reducing medications.  -she can be discharged from a cardiology standpoint    For questions or updates, please contact [facility] Please consult [email or web address] for contact info under        Signed, Maisie Fus, MD  02/02/2023, 9:10 AM

## 2023-02-02 NOTE — Consult Note (Signed)
Cardiology Consultation   Patient ID: AAYANA DUCKWORTH MRN: [id number]; DOB: Jun 05, 1930  Admit date: 02/01/2023 Date of Consult: 02/02/2023  PCP:  Andi Devon, MD   [facility] Providers Cardiologist:  None        Patient Profile:   [name] is a 89 y.o. female with a significant past medical history of recurrent deep vein thrombosis on oral anticoagulation, hypertension, hypercholesteremia, and gastroesophageal reflux disease who is being seen 02/02/2023 for the evaluation of fall at the request of Dr. Darreld Mclean.  History of Present Illness:   [name] states this morning while getting up from her bed she fell forward onto the ground.  She states that when she tried to stand after placing her second foot on the ground she 'rolled' forward onto the ground.  She denies any symptoms prior to or after her fall such as dizziness, lightheadedness, or palpitations.  Patient does report lightheadedness periodically when she move her head too fast but this last a few seconds and never with syncope.  She is without any active symptoms at this time.  In the emergency department, ECG revealed sinus bradycardia with what appears to be 2nd degree AV block (Mobitz I).  Due to concern for bradycardia causing her fall, cardiology was consulted.     Past Medical History:  Diagnosis Date   Blood clot in vein    GERD (gastroesophageal reflux disease)    H/O echocardiogram 09/05/2009   EF >55%   High cholesterol    Hypertension    Sleep apnea     Past Surgical History:  Procedure Laterality Date   ABDOMINAL HYSTERECTOMY     CARDIAC CATHETERIZATION  01/27/2003   NM MYOVIEW LTD  8/804   RULE OUT ISCHEMIA   TUBAL LIGATION       Home Medications:  Prior to Admission medications   Medication Sig Start Date End Date Taking? Authorizing Provider  amLODipine (NORVASC) 2.5 MG tablet Take 1 tablet by mouth daily.   Yes [provider]  apixaban (ELIQUIS) 5 MG TABS  tablet 10mg  PO BID for 7 days, then 5mg  PO BID thereafter. Patient taking differently: Take 5 mg by mouth 2 (two) times daily. 11/02/15  Yes Kirby Crigler, Mir M, MD  FeFum-FePoly-FA-B Cmp-C-Biot [facility]) CAPS Take 1 capsule by mouth daily. 11/29/22  Yes [provider]  Multiple Vitamins-Minerals (PRESERVISION AREDS 2+MULTI VIT PO) Take 1 tablet by mouth daily.   Yes [provider]  olmesartan-hydrochlorothiazide (BENICAR HCT) 20-12.5 MG tablet Take 1 tablet by mouth daily.   Yes [provider]  OVER THE COUNTER MEDICATION Take 1 tablet by mouth daily. D3 k2   Yes [provider]  OVER THE COUNTER MEDICATION Take 1 tablet by mouth daily. Omega xl   Yes [provider]  simvastatin (ZOCOR) 40 MG tablet Take 40 mg by mouth daily at 6 PM. 11/05/14  Yes [provider]  omeprazole (PRILOSEC) 20 MG capsule 1 capsule by mouth every day PT NEEDS AN APPOINTMENT Patient not taking: Reported on 02/01/2023 08/02/21   Unk Lightning, PA    Inpatient Medications: Scheduled Meds:  apixaban  5 mg Oral BID   simvastatin  40 mg Oral q1800   sodium chloride flush  3 mL Intravenous Q12H   Continuous Infusions:  lactated ringers 75 mL/hr at 02/01/23 2312   PRN Meds: acetaminophen **OR** acetaminophen, ondansetron **OR** ondansetron (ZOFRAN) IV, senna-docusate  Allergies:   No Known Allergies  Social History:  Social History   Socioeconomic History   Marital status: Single    Spouse name: Not on file   Number of children: 4   Years of education: Not on file   Highest education level: Not on file  Occupational History   Occupation: retired  Tobacco Use   Smoking status: Never   Smokeless tobacco: Never  Vaping Use   Vaping status: Never Used  Substance and Sexual Activity   Alcohol use: No   Drug use: No   Sexual activity: Never  Other Topics Concern   Not on file  Social History Narrative   Not on file   Social Determinants  of Health   Financial Resource Strain: Not on file  Food Insecurity: No Food Insecurity (02/01/2023)   Hunger Vital Sign    Worried About Running Out of Food in the Last Year: Never true    Ran Out of Food in the Last Year: Never true  Transportation Needs: No Transportation Needs (02/01/2023)   PRAPARE - Administrator, Civil Service (Medical): No    Lack of Transportation (Non-Medical): No  Physical Activity: Not on file  Stress: Not on file  Social Connections: Not on file  Intimate Partner Violence: Not At Risk (02/01/2023)   Humiliation, Afraid, Rape, and Kick questionnaire    Fear of Current or Ex-Partner: No    Emotionally Abused: No    Physically Abused: No    Sexually Abused: No    Family History:    Family History  Problem Relation Age of Onset   Diabetes Sister    Colon cancer Neg Hx    Esophageal cancer Neg Hx    Rectal cancer Neg Hx    Stomach cancer Neg Hx      ROS:  Please see the history of present illness.  All other ROS reviewed and negative.     Physical Exam/Data:   Vitals:   02/01/23 1723 02/01/23 1930 02/01/23 2100 02/01/23 2305  BP: (!) 176/72 (!) 156/67 (!) 122/45 (!) 140/68  Pulse: (!) 51 (!) 51 (!) 41 62  Resp: 16 18 14 15   Temp: 98 F (36.7 C) 98.1 F (36.7 C)  99 F (37.2 C)  TempSrc: Oral Oral  Oral  SpO2: 100% 100% 97% 100%  Weight: 64.4 kg   58.3 kg  Height: 5\' 4"  (1.626 m)   5\' 4"  (1.626 m)    Intake/Output Summary (Last 24 hours) at 02/02/2023 0222 Last data filed at 02/02/2023 0141 Gross per 24 hour  Intake 422.58 ml  Output 200 ml  Net 222.58 ml      02/01/2023   11:05 PM 02/01/2023    5:23 PM 07/08/2019   11:23 AM  Last 3 Weights  Weight (lbs) 128 lb 8.5 oz 142 lb 142 lb  Weight (kg) 58.3 kg 64.411 kg 64.411 kg     Body mass index is 22.06 kg/m.  General:  Well nourished, well developed, in no acute distress HEENT: normal Neck: no JVD Vascular: No carotid bruits; Distal pulses 2+ bilaterally Cardiac:   normal S1, S2; RRR; 1/6 holosystolic murmur with faint decrescendo diastolic murmur heard best at apex.   Lungs:  clear to auscultation bilaterally, no wheezing, rhonchi or rales  Abd: soft, nontender, no hepatomegaly  Ext: no edema Musculoskeletal:  No deformities, BUE and BLE strength normal and equal Skin: warm and dry  Neuro:  CNs 2-12 intact, no focal abnormalities noted Psych:  Normal affect   EKG:  The EKG  was personally reviewed and demonstrates:  02/01/23 (17:20 hrs):  NSR with 2nd degree AV block Telemetry:  Telemetry was personally reviewed and demonstrates:  Sinus bradycardia with intermittent 2nd degree AV block  Relevant CV Studies: None  Laboratory Data:  High Sensitivity Troponin:   Recent Labs  Lab 02/01/23 1735 02/01/23 1942  TROPONINIHS 14 17     Chemistry Recent Labs  Lab 02/01/23 1735 02/01/23 2328  NA 141  --   K 3.3*  --   CL 106  --   CO2 25  --   GLUCOSE 85  --   BUN 27*  --   CREATININE 1.29*  --   CALCIUM 9.0  --   MG  --  1.7  GFRNONAA 39*  --   ANIONGAP 10  --     No results for input(s): "PROT", "ALBUMIN", "AST", "ALT", "ALKPHOS", "BILITOT" in the last 168 hours. Lipids No results for input(s): "CHOL", "TRIG", "HDL", "LABVLDL", "LDLCALC", "CHOLHDL" in the last 168 hours.  Hematology Recent Labs  Lab 02/01/23 1735  WBC 6.7  RBC 3.46*  HGB 10.4*  HCT 32.3*  MCV 93.4  MCH 30.1  MCHC 32.2  RDW 14.7  PLT 213   Thyroid No results for input(s): "TSH", "FREET4" in the last 168 hours.  BNPNo results for input(s): "BNP", "PROBNP" in the last 168 hours.  DDimer No results for input(s): "DDIMER" in the last 168 hours.   Radiology/Studies:  CT Head Wo Contrast  Result Date: 02/01/2023 CLINICAL DATA:  Neuro deficit, acute, stroke suspected.  Weakness. EXAM: CT HEAD WITHOUT CONTRAST TECHNIQUE: Contiguous axial images were obtained from the base of the skull through the vertex without intravenous contrast. RADIATION DOSE REDUCTION: This  exam was performed according to the departmental dose-optimization program which includes automated exposure control, adjustment of the mA and/or kV according to patient size and/or use of iterative reconstruction technique. COMPARISON:  None Available. FINDINGS: Brain: There is atrophy and chronic small vessel disease changes. No acute intracranial abnormality. Specifically, no hemorrhage, hydrocephalus, mass lesion, acute infarction, or significant intracranial injury. Vascular: No hyperdense vessel or unexpected calcification. Skull: No acute calvarial abnormality. Sinuses/Orbits: No acute findings Other: None IMPRESSION: Atrophy, chronic microvascular disease. No acute intracranial abnormality. Electronically Signed   By: Charlett Nose M.D.   On: 02/01/2023 19:12     Assessment and Plan:   Bradyarrhythmia: Patient with noted bradycardia and 2nd degree AV block (Mobitz I) for which patient appears asymptomatic.  No obvious reversible etiology and likely age related cardiac conduction changes.  Recent fall unlikely due to 2nd degree AV block given patient describes a mechanical fall without dizziness, lightheadedness, or syncope. --Avoid chronotropic reducing medications.    Fall: Patient with likely mechanical fall based on description.  Unlikely driven by bradyarrhythmia.   --Management per primary team.    Risk Assessment/Risk Scores:  2        For questions or updates, please contact [facility] Please consult [email or web address] for contact info under    Signed, Judie Grieve, MD  02/02/2023 2:22 AM

## 2023-02-02 NOTE — Progress Notes (Signed)
Echocardiogram 2D Echocardiogram has been performed.  Lucendia Herrlich 02/02/2023, 11:14 AM

## 2023-02-02 NOTE — Plan of Care (Signed)
°  Problem: Coping: °Goal: Level of anxiety will decrease °Outcome: Progressing °  °

## 2023-02-02 NOTE — Evaluation (Signed)
Physical Therapy Evaluation Patient Details Name: [name] MRN: [id number] DOB: [birth year] Today's Date: 02/02/2023  History of Present Illness  The pt is a 89 yo female presenting 9/28 after 1-2 falls with reports of general weakness. Cardiology reports rhythm has been 2nd degree mobitz I since admission with little concern for cardiac cause of fall. PMH includes: sinus bradycardia with first-degree AV block, recurrent DVT on chronic Eliquis, HTN, HLD, RA.   Clinical Impression  Pt in bed upon arrival of PT, agreeable to evaluation at this time. Prior to admission the pt was mobilizing with use of a cane in her home, living alone, but with family stopping by intermittently to assist with meals and bathing. The pt reports increased difficulty recently with ADLs, generally declining appetite, and poor food intake due to limited activity tolerance to make food or poor appetite. The pt reports no other falls, but frequent stumbles and will walk with cane in one hand and furniture in the other for added support. The pt was limited to ~12 ft walking in her room with use of RW and minA-CGA this session prior to needing seated rest due to fatigue and pain in bilateral knees (chronic). The pt will benefit from skilled PT to progress functional independence with transfers and regain endurance and activity tolerance to allow for greatest level of independence. The pt's daughter was present and is hopeful for continued inpatient rehab <3hours/day which I also believe will benefit this patient.       If plan is discharge home, recommend the following: A little help with walking and/or transfers;A little help with bathing/dressing/bathroom;Assistance with cooking/housework;Direct supervision/assist for medications management;Direct supervision/assist for financial management;Assist for transportation;Supervision due to cognitive status;Help with stairs or ramp for entrance   Can travel by private vehicle    Yes    Equipment Recommendations Rolling walker (2 wheels)  Recommendations for Other Services       Functional Status Assessment Patient has had a recent decline in their functional status and demonstrates the ability to make significant improvements in function in a reasonable and predictable amount of time.     Precautions / Restrictions Precautions Precautions: Fall Precaution Comments: admitted after a fall, reports multiple other stumbles Restrictions Weight Bearing Restrictions: No      Mobility  Bed Mobility Overal bed mobility: Needs Assistance Bed Mobility: Supine to Sit     Supine to sit: Supervision, Used rails, HOB elevated     General bed mobility comments: supervision with pt using HOB elevated and rails    Transfers Overall transfer level: Needs assistance Equipment used: 1 person hand held assist, Rolling walker (2 wheels) Transfers: Sit to/from Stand, Bed to chair/wheelchair/BSC Sit to Stand: Min assist, Contact guard assist   Step pivot transfers: Min assist       General transfer comment: minA to rise without RW, minA to pivot to BSC without RW, CGA to rise to standing with support of RW. pt benefits from cues for hand placement. minA to rise from lower chair    Ambulation/Gait Ambulation/Gait assistance: Contact guard assist, Min assist Gait Distance (Feet): 12 Feet (+ 12 ft) Assistive device: Rolling walker (2 wheels) Gait Pattern/deviations: Step-through pattern, Decreased stride length, Decreased step length - left, Decreased dorsiflexion - left, Decreased weight shift to left Gait velocity: decreased Gait velocity interpretation: <1.31 ft/sec, indicative of household ambulator   General Gait Details: pt with painful knees and reports she is limited equally by pain and fatigue. progressive decreased clearance of LLE  with gait, pt also reports pain in LLE is worse than RLE    Balance Overall balance assessment: Needs  assistance Sitting-balance support: No upper extremity supported, Feet supported Sitting balance-Leahy Scale: Fair     Standing balance support: Bilateral upper extremity supported, During functional activity Standing balance-Leahy Scale: Fair Standing balance comment: can static stand with single UE support, BUE support for gait                             Pertinent Vitals/Pain Pain Assessment Pain Assessment: Faces Faces Pain Scale: Hurts little more Pain Location: bilateral knees L >R Pain Descriptors / Indicators: Dull, Grimacing Pain Intervention(s): Limited activity within patient's tolerance, Monitored during session, Repositioned    Home Living Family/patient expects to be discharged to:: Private residence Living Arrangements: Children Available Help at Discharge: Family;Available PRN/intermittently Type of Home: House Home Access: Stairs to enter Entrance Stairs-Rails: Left Entrance Stairs-Number of Steps: 2   Home Layout: One level Home Equipment: Agricultural consultant (2 wheels);Cane - quad;Cane - single point;Shower seat;BSC/3in1;Hand held shower head Additional Comments: family available but only for checking in on patient, not for staying with her    Prior Function Prior Level of Function : Independent/Modified Independent;Needs assist;History of Falls (last six months)             Mobility Comments: use of cane and furniture walking, stumbles but only 1 fall ADLs Comments: reports independent with dressing, poor ability to complete IADLs, daughter helps with shower     Extremity/Trunk Assessment   Upper Extremity Assessment Upper Extremity Assessment: Generalized weakness    Lower Extremity Assessment Lower Extremity Assessment: Generalized weakness;RLE deficits/detail;LLE deficits/detail RLE Deficits / Details: painful knee (chronic) with grossly 3/5 strength to MMT. limited by power, strength, and pain. reports sensation intact RLE: Unable to  fully assess due to pain RLE Sensation: WNL LLE Deficits / Details: painful knee (chronic) with grossly 3/5 strength to MMT. limited by power, strength, and pain. reports sensation intact LLE Sensation: WNL    Cervical / Trunk Assessment Cervical / Trunk Assessment: Kyphotic  Communication   Communication Communication: No apparent difficulties Cueing Techniques: Verbal cues  Cognition Arousal: Alert Behavior During Therapy: WFL for tasks assessed/performed Overall Cognitive Status: Within Functional Limits for tasks assessed                                          General Comments General comments (skin integrity, edema, etc.): VSS on RA    Exercises     Assessment/Plan    PT Assessment Patient needs continued PT services  PT Problem List Decreased strength;Decreased range of motion;Decreased activity tolerance;Decreased balance;Decreased mobility;Decreased safety awareness       PT Treatment Interventions DME instruction;Gait training;Stair training;Functional mobility training;Therapeutic activities;Therapeutic exercise;Balance training;Patient/family education    PT Goals (Current goals can be found in the Care Plan section)  Acute Rehab PT Goals Patient Stated Goal: improve strength and recover some independence PT Goal Formulation: With patient/family Time For Goal Achievement: 02/16/23 Potential to Achieve Goals: Good    Frequency Min 1X/week        AM-PAC PT "6 Clicks" Mobility  Outcome Measure Help needed turning from your back to your side while in a flat bed without using bedrails?: None Help needed moving from lying on your back to sitting on the side of a  flat bed without using bedrails?: A Little Help needed moving to and from a bed to a chair (including a wheelchair)?: A Little Help needed standing up from a chair using your arms (e.g., wheelchair or bedside chair)?: A Little Help needed to walk in hospital room?: A Lot (<20  ft) Help needed climbing 3-5 steps with a railing? : A Lot 6 Click Score: 17    End of Session Equipment Utilized During Treatment: Gait belt Activity Tolerance: Patient tolerated treatment well Patient left: in chair;with call bell/phone within reach;with chair alarm set;with family/visitor present Nurse Communication: Mobility status PT Visit Diagnosis: Unsteadiness on feet (R26.81);Other abnormalities of gait and mobility (R26.89);Muscle weakness (generalized) (M62.81);History of falling (Z91.81);Pain Pain - Right/Left: Left Pain - part of body: Knee    Time: 1109-1150 PT Time Calculation (min) (ACUTE ONLY): 41 min   Charges:   PT Evaluation $PT Eval Low Complexity: 1 Low PT Treatments $Gait Training: 8-22 mins $Therapeutic Exercise: 8-22 mins PT General Charges $$ ACUTE PT VISIT: 1 Visit         Vickki Muff, PT, DPT   Acute Rehabilitation Department Office [phone] Secure Chat Communication Preferred  Ronnie Derby 02/02/2023, 2:21 PM

## 2023-02-02 NOTE — Care Management Obs Status (Signed)
MEDICARE OBSERVATION STATUS NOTIFICATION   Patient Details  Name: [name] MRN: [id number] Date of Birth: 01-08-1931   Medicare Observation Status Notification Given:  Yes    Fuller Makin G., RN 02/02/2023, 2:22 PM

## 2023-02-02 NOTE — Progress Notes (Signed)
PROGRESS NOTE    [name]  NGE:[id number] DOB: [birth year] DOA: 02/01/2023 PCP: Andi Devon, MD   Brief Narrative: [name] is a 89 y.o. female with a history of sinus bradycardia with first-degree AV block, recurrent DVT on Eliquis, hypertension, hyperlipidemia, rheumatoid arthritis.  Patient presented secondary to fall after getting out of bed with no loss of consciousness but with some lightheadedness prior to falling. Echo Cardiology was consulted with recommendation for no further workup or management. Transthoracic Echocardiogram significant for normal LVEF of 60-65% with evidence of elevated pulmonary artery pressures, enlarged right ventricle and moderate tricuspid regurgitation.   Assessment and Plan:  Bradycardia Type 1 second degree AV block Cardiology consulted. No workup per cardiology. -Continue telemetry  Hypokalemia Potassium of 3.3 on admission. Resolved with potassium supplementation.  Fall Initial concern this could have been cardiac related. Per cardiology, unlikely related to bradyarrhythmia. PT/OT consulted.  AKI Unknown baseline. Last creatinine of 1.58 from 2021. Creatinine of 1.29 on admission. Improvement to 1.17.  Primary hypertension Patient is on olmesartan-hydrochlorothiazide and amlodipine as an outpatient. Antihypertensives held on admission.  Hyperlipidemia -Continue simvastatin  History of DVT -Continue Eliquis  Pulmonary artery hypertension Noted on Transthoracic Echocardiogram. Cardiology recommendations.   DVT prophylaxis: Eliquis Code Status:   Code Status: Limited: Do not attempt resuscitation (DNR) -DNR-LIMITED -Do Not Intubate/DNI  Family Communication: Daughter via telephone Disposition Plan: Discharge likely to SNF pending bed availability. Medically stable.   Consultants:  Cardiology  Procedures:  Echo  Antimicrobials: None    Subjective: No concerns this morning. Feels well.  Objective: BP (!)  118/56   Pulse (!) 53   Temp 98.3 F (36.8 C)   Resp 15   Ht 5\' 4"  (1.626 m)   Wt 58.3 kg   SpO2 100%   BMI 22.06 kg/m   Examination:  General exam: Appears calm and comfortable  Respiratory system: Clear to auscultation. Respiratory effort normal. Cardiovascular system: S1 & S2 heard, slow heart rate, normal rhythm. Gastrointestinal system: Abdomen is nondistended, soft and nontender. No organomegaly or masses felt. Normal bowel sounds heard. Central nervous system: Alert and oriented. No focal neurological deficits. Musculoskeletal: No edema. No calf tenderness Skin: No cyanosis. No rashes Psychiatry: Judgement and insight appear normal. Mood & affect appropriate.    Data Reviewed: I have personally reviewed following labs and imaging studies  CBC Lab Results  Component Value Date   WBC 5.9 02/02/2023   RBC 3.11 (L) 02/02/2023   HGB 9.4 (L) 02/02/2023   HCT 28.5 (L) 02/02/2023   MCV 91.6 02/02/2023   MCH 30.2 02/02/2023   PLT 184 02/02/2023   MCHC 33.0 02/02/2023   RDW 14.6 02/02/2023   LYMPHSABS 1.4 05/10/2019   MONOABS 0.6 05/10/2019   EOSABS 0.0 05/10/2019   BASOSABS 0.0 05/10/2019     Last metabolic panel Lab Results  Component Value Date   NA 140 02/02/2023   K 4.2 02/02/2023   CL 110 02/02/2023   CO2 24 02/02/2023   BUN 24 (H) 02/02/2023   CREATININE 1.17 (H) 02/02/2023   GLUCOSE 94 02/02/2023   GFRNONAA 44 (L) 02/02/2023   GFRAA 34 (L) 05/10/2019   CALCIUM 8.7 (L) 02/02/2023   PROT 7.2 11/01/2015   ALBUMIN 3.4 (L) 11/01/2015   BILITOT 0.3 11/01/2015   ALKPHOS 52 11/01/2015   AST 19 11/01/2015   ALT 12 (L) 11/01/2015   ANIONGAP 6 02/02/2023    GFR: Estimated Creatinine Clearance: 27.6 mL/min (A) (by C-G formula based  on SCr of 1.17 mg/dL (H)).  No results found for this or any previous visit (from the past 240 hour(s)).    Radiology Studies: ECHOCARDIOGRAM COMPLETE  Result Date: 02/02/2023    ECHOCARDIOGRAM REPORT   Patient Name:    [name] Date of Exam: 02/02/2023 Medical Rec #:  [id number]     Height:       64.0 in Accession #:    [id number]    Weight:       128.5 lb Date of Birth:  [birth year]      BSA:          1.621 m Patient Age:    89 years      BP:           146/57 mmHg Patient Gender: F             HR:           44 bpm. Exam Location:  Inpatient Procedure: 2D Echo, Cardiac Doppler and Color Doppler Indications:    Heart block, 2nd degree I44.1  History:        Patient has no prior history of Echocardiogram examinations.                 Arrythmias:Bradycardia; Risk Factors:Hypertension, Dyslipidemia                 and Sleep Apnea.  Sonographer:    Lucendia Herrlich Referring Phys: [id number] VISHAL R PATEL IMPRESSIONS  1. Left ventricular ejection fraction, by estimation, is 60 to 65%. The left ventricle has normal function. The left ventricle has no regional wall motion abnormalities. Left ventricular diastolic parameters are indeterminate.  2. Right ventricular systolic function is normal. The right ventricular size is mildly enlarged. There is moderately elevated pulmonary artery systolic pressure. The estimated right ventricular systolic pressure is 49.0 mmHg.  3. Left atrial size was mildly dilated.  4. Right atrial size was moderately dilated.  5. The mitral valve is normal in structure. Mild mitral valve regurgitation. No evidence of mitral stenosis.  6. Tricuspid valve regurgitation is moderate.  7. The aortic valve was not well visualized. Aortic valve regurgitation is trivial. Aortic valve sclerosis/calcification is present, without any evidence of aortic stenosis.  8. The inferior vena cava is dilated in size with >50% respiratory variability, suggesting right atrial pressure of 8 mmHg. FINDINGS  Left Ventricle: Left ventricular ejection fraction, by estimation, is 60 to 65%. The left ventricle has normal function. The left ventricle has no regional wall motion abnormalities. The left ventricular internal cavity size was  normal in size. There is  no left ventricular hypertrophy. Left ventricular diastolic parameters are indeterminate. Right Ventricle: The right ventricular size is mildly enlarged. Right vetricular wall thickness was not well visualized. Right ventricular systolic function is normal. There is moderately elevated pulmonary artery systolic pressure. The tricuspid regurgitant velocity is 3.20 m/s, and with an assumed right atrial pressure of 8 mmHg, the estimated right ventricular systolic pressure is 49.0 mmHg. Left Atrium: Left atrial size was mildly dilated. Right Atrium: Right atrial size was moderately dilated. Pericardium: There is no evidence of pericardial effusion. Mitral Valve: The mitral valve is normal in structure. Mild mitral valve regurgitation. No evidence of mitral valve stenosis. Tricuspid Valve: The tricuspid valve is normal in structure. Tricuspid valve regurgitation is moderate. Aortic Valve: The aortic valve was not well visualized. Aortic valve regurgitation is trivial. Aortic valve sclerosis/calcification is present, without any evidence of aortic stenosis. Aortic valve peak  on SCr of 1.17 mg/dL (H)).  No results found for this or any previous visit (from the past 240 hour(s)).    Radiology Studies: ECHOCARDIOGRAM COMPLETE  Result Date: 02/02/2023    ECHOCARDIOGRAM REPORT   Patient Name:    [name] Date of Exam: 02/02/2023 Medical Rec #:  [id number]     Height:       64.0 in Accession #:    [id number]    Weight:       128.5 lb Date of Birth:  [birth year]      BSA:          1.621 m Patient Age:    89 years      BP:           146/57 mmHg Patient Gender: F             HR:           44 bpm. Exam Location:  Inpatient Procedure: 2D Echo, Cardiac Doppler and Color Doppler Indications:    Heart block, 2nd degree I44.1  History:        Patient has no prior history of Echocardiogram examinations.                 Arrythmias:Bradycardia; Risk Factors:Hypertension, Dyslipidemia                 and Sleep Apnea.  Sonographer:    Lucendia Herrlich Referring Phys: [id number] VISHAL R PATEL IMPRESSIONS  1. Left ventricular ejection fraction, by estimation, is 60 to 65%. The left ventricle has normal function. The left ventricle has no regional wall motion abnormalities. Left ventricular diastolic parameters are indeterminate.  2. Right ventricular systolic function is normal. The right ventricular size is mildly enlarged. There is moderately elevated pulmonary artery systolic pressure. The estimated right ventricular systolic pressure is 49.0 mmHg.  3. Left atrial size was mildly dilated.  4. Right atrial size was moderately dilated.  5. The mitral valve is normal in structure. Mild mitral valve regurgitation. No evidence of mitral stenosis.  6. Tricuspid valve regurgitation is moderate.  7. The aortic valve was not well visualized. Aortic valve regurgitation is trivial. Aortic valve sclerosis/calcification is present, without any evidence of aortic stenosis.  8. The inferior vena cava is dilated in size with >50% respiratory variability, suggesting right atrial pressure of 8 mmHg. FINDINGS  Left Ventricle: Left ventricular ejection fraction, by estimation, is 60 to 65%. The left ventricle has normal function. The left ventricle has no regional wall motion abnormalities. The left ventricular internal cavity size was  normal in size. There is  no left ventricular hypertrophy. Left ventricular diastolic parameters are indeterminate. Right Ventricle: The right ventricular size is mildly enlarged. Right vetricular wall thickness was not well visualized. Right ventricular systolic function is normal. There is moderately elevated pulmonary artery systolic pressure. The tricuspid regurgitant velocity is 3.20 m/s, and with an assumed right atrial pressure of 8 mmHg, the estimated right ventricular systolic pressure is 49.0 mmHg. Left Atrium: Left atrial size was mildly dilated. Right Atrium: Right atrial size was moderately dilated. Pericardium: There is no evidence of pericardial effusion. Mitral Valve: The mitral valve is normal in structure. Mild mitral valve regurgitation. No evidence of mitral valve stenosis. Tricuspid Valve: The tricuspid valve is normal in structure. Tricuspid valve regurgitation is moderate. Aortic Valve: The aortic valve was not well visualized. Aortic valve regurgitation is trivial. Aortic valve sclerosis/calcification is present, without any evidence of aortic stenosis. Aortic valve peak  PROGRESS NOTE    [name]  NGE:[id number] DOB: [birth year] DOA: 02/01/2023 PCP: Andi Devon, MD   Brief Narrative: [name] is a 89 y.o. female with a history of sinus bradycardia with first-degree AV block, recurrent DVT on Eliquis, hypertension, hyperlipidemia, rheumatoid arthritis.  Patient presented secondary to fall after getting out of bed with no loss of consciousness but with some lightheadedness prior to falling. Echo Cardiology was consulted with recommendation for no further workup or management. Transthoracic Echocardiogram significant for normal LVEF of 60-65% with evidence of elevated pulmonary artery pressures, enlarged right ventricle and moderate tricuspid regurgitation.   Assessment and Plan:  Bradycardia Type 1 second degree AV block Cardiology consulted. No workup per cardiology. -Continue telemetry  Hypokalemia Potassium of 3.3 on admission. Resolved with potassium supplementation.  Fall Initial concern this could have been cardiac related. Per cardiology, unlikely related to bradyarrhythmia. PT/OT consulted.  AKI Unknown baseline. Last creatinine of 1.58 from 2021. Creatinine of 1.29 on admission. Improvement to 1.17.  Primary hypertension Patient is on olmesartan-hydrochlorothiazide and amlodipine as an outpatient. Antihypertensives held on admission.  Hyperlipidemia -Continue simvastatin  History of DVT -Continue Eliquis  Pulmonary artery hypertension Noted on Transthoracic Echocardiogram. Cardiology recommendations.   DVT prophylaxis: Eliquis Code Status:   Code Status: Limited: Do not attempt resuscitation (DNR) -DNR-LIMITED -Do Not Intubate/DNI  Family Communication: Daughter via telephone Disposition Plan: Discharge likely to SNF pending bed availability. Medically stable.   Consultants:  Cardiology  Procedures:  Echo  Antimicrobials: None    Subjective: No concerns this morning. Feels well.  Objective: BP (!)  118/56   Pulse (!) 53   Temp 98.3 F (36.8 C)   Resp 15   Ht 5\' 4"  (1.626 m)   Wt 58.3 kg   SpO2 100%   BMI 22.06 kg/m   Examination:  General exam: Appears calm and comfortable  Respiratory system: Clear to auscultation. Respiratory effort normal. Cardiovascular system: S1 & S2 heard, slow heart rate, normal rhythm. Gastrointestinal system: Abdomen is nondistended, soft and nontender. No organomegaly or masses felt. Normal bowel sounds heard. Central nervous system: Alert and oriented. No focal neurological deficits. Musculoskeletal: No edema. No calf tenderness Skin: No cyanosis. No rashes Psychiatry: Judgement and insight appear normal. Mood & affect appropriate.    Data Reviewed: I have personally reviewed following labs and imaging studies  CBC Lab Results  Component Value Date   WBC 5.9 02/02/2023   RBC 3.11 (L) 02/02/2023   HGB 9.4 (L) 02/02/2023   HCT 28.5 (L) 02/02/2023   MCV 91.6 02/02/2023   MCH 30.2 02/02/2023   PLT 184 02/02/2023   MCHC 33.0 02/02/2023   RDW 14.6 02/02/2023   LYMPHSABS 1.4 05/10/2019   MONOABS 0.6 05/10/2019   EOSABS 0.0 05/10/2019   BASOSABS 0.0 05/10/2019     Last metabolic panel Lab Results  Component Value Date   NA 140 02/02/2023   K 4.2 02/02/2023   CL 110 02/02/2023   CO2 24 02/02/2023   BUN 24 (H) 02/02/2023   CREATININE 1.17 (H) 02/02/2023   GLUCOSE 94 02/02/2023   GFRNONAA 44 (L) 02/02/2023   GFRAA 34 (L) 05/10/2019   CALCIUM 8.7 (L) 02/02/2023   PROT 7.2 11/01/2015   ALBUMIN 3.4 (L) 11/01/2015   BILITOT 0.3 11/01/2015   ALKPHOS 52 11/01/2015   AST 19 11/01/2015   ALT 12 (L) 11/01/2015   ANIONGAP 6 02/02/2023    GFR: Estimated Creatinine Clearance: 27.6 mL/min (A) (by C-G formula based

## 2023-02-03 DIAGNOSIS — I10 Essential (primary) hypertension: Secondary | ICD-10-CM

## 2023-02-03 DIAGNOSIS — R001 Bradycardia, unspecified: Secondary | ICD-10-CM

## 2023-02-03 DIAGNOSIS — R296 Repeated falls: Secondary | ICD-10-CM | POA: Diagnosis not present

## 2023-02-03 DIAGNOSIS — Z86718 Personal history of other venous thrombosis and embolism: Secondary | ICD-10-CM

## 2023-02-03 DIAGNOSIS — I441 Atrioventricular block, second degree: Secondary | ICD-10-CM | POA: Diagnosis not present

## 2023-02-03 DIAGNOSIS — W19XXXA Unspecified fall, initial encounter: Secondary | ICD-10-CM | POA: Diagnosis not present

## 2023-02-03 LAB — CBC
HCT: 29 % — ABNORMAL LOW (ref 36.0–46.0)
Hemoglobin: 9.6 g/dL — ABNORMAL LOW (ref 12.0–15.0)
MCH: 30.4 pg (ref 26.0–34.0)
MCHC: 33.1 g/dL (ref 30.0–36.0)
MCV: 91.8 fL (ref 80.0–100.0)
Platelets: 200 10*3/uL (ref 150–400)
RBC: 3.16 MIL/uL — ABNORMAL LOW (ref 3.87–5.11)
RDW: 14.5 % (ref 11.5–15.5)
WBC: 6.4 10*3/uL (ref 4.0–10.5)
nRBC: 0 % (ref 0.0–0.2)

## 2023-02-03 MED ORDER — SIMVASTATIN 20 MG PO TABS
20.0000 mg | ORAL_TABLET | Freq: Every day | ORAL | Status: DC
  Administered 2023-02-03 – 2023-02-05 (×3): 20 mg via ORAL
  Filled 2023-02-03 (×3): qty 1

## 2023-02-03 NOTE — Evaluation (Signed)
Occupational Therapy Evaluation Patient Details Name: [name] MRN: [id number] DOB: [birth year] Today's Date: 02/03/2023   History of Present Illness The pt is a 89 yo female presenting 9/28 after 1-2 falls with reports of general weakness. Cardiology reports rhythm has been 2nd degree mobitz I since admission with little concern for cardiac cause of fall. PMH includes: sinus bradycardia with first-degree AV block, recurrent DVT on chronic Eliquis, HTN, HLD, RA.   Clinical Impression   PTA, pt lives alone, typically ambulatory with a cane and furniture walking at home. Pt reports typically Modified Independent with ADLs aside from showering tasks with family also intermittently assisting with IADLs. Pt presents now with deficits in strength, dynamic standing balance, endurance and chronic knee discomfort. Overall, pt requires Min A for mobility using RW (frequent cueing for DME use) and up to Mod A for ADLs. Family unable to provide increased support at DC so recommend continued inpatient follow up therapy, <3 hours/day at DC.  SpO2 100% on RA      If plan is discharge home, recommend the following: A little help with walking and/or transfers;A little help with bathing/dressing/bathroom;Assistance with cooking/housework;Help with stairs or ramp for entrance    Functional Status Assessment  Patient has had a recent decline in their functional status and demonstrates the ability to make significant improvements in function in a reasonable and predictable amount of time.  Equipment Recommendations  Other (comment) (RW)    Recommendations for Other Services       Precautions / Restrictions Precautions Precautions: Fall Precaution Comments: admitted after a fall, reports multiple other stumbles Restrictions Weight Bearing Restrictions: No      Mobility Bed Mobility               General bed mobility comments: in recliner on entry    Transfers Overall transfer level: Needs  assistance Equipment used: Rolling walker (2 wheels) Transfers: Sit to/from Stand Sit to Stand: Contact guard assist                  Balance Overall balance assessment: Needs assistance Sitting-balance support: No upper extremity supported, Feet supported Sitting balance-Leahy Scale: Fair     Standing balance support: Bilateral upper extremity supported, During functional activity Standing balance-Leahy Scale: Poor                             ADL either performed or assessed with clinical judgement   ADL Overall ADL's : Needs assistance/impaired Eating/Feeding: Independent   Grooming: Contact guard assist;Standing   Upper Body Bathing: Set up;Sitting   Lower Body Bathing: Minimal assistance;Sit to/from stand;Sitting/lateral leans   Upper Body Dressing : Set up;Sitting   Lower Body Dressing: Moderate assistance;Sitting/lateral leans;Sit to/from stand   Toilet Transfer: Minimal assistance;Ambulation;Rolling walker (2 wheels)   Toileting- Clothing Manipulation and Hygiene: Minimal assistance;Sitting/lateral lean;Sit to/from stand       Functional mobility during ADLs: Minimal assistance;Rolling walker (2 wheels);Cueing for sequencing       Vision Ability to See in Adequate Light: 0 Adequate Patient Visual Report: No change from baseline Vision Assessment?: No apparent visual deficits     Perception         Praxis         Pertinent Vitals/Pain Pain Assessment Pain Assessment: Faces Faces Pain Scale: Hurts little more Pain Location: bilateral knees L >R Pain Descriptors / Indicators: Dull, Grimacing Pain Intervention(s): Monitored during session, Limited activity within patient's tolerance  Extremity/Trunk Assessment Upper Extremity Assessment Upper Extremity Assessment: Generalized weakness;Right hand dominant   Lower Extremity Assessment Lower Extremity Assessment: Defer to PT evaluation   Cervical / Trunk Assessment Cervical /  Trunk Assessment: Kyphotic   Communication Communication Communication: No apparent difficulties   Cognition Arousal: Alert Behavior During Therapy: WFL for tasks assessed/performed Overall Cognitive Status: Within Functional Limits for tasks assessed                                       General Comments  HR 50s. O2 100% on RA    Exercises     Shoulder Instructions      Home Living Family/patient expects to be discharged to:: Private residence Living Arrangements: Alone Available Help at Discharge: Family;Available PRN/intermittently Type of Home: House Home Access: Stairs to enter [employer] of Steps: 2 Entrance Stairs-Rails: Left Home Layout: One level     Bathroom Shower/Tub: Chief Strategy Officer: Handicapped height     Home Equipment: Cane - quad;Cane - single point;Shower seat;BSC/3in1;Hand held Stage manager (4 wheels)          Prior Functioning/Environment Prior Level of Function : Independent/Modified Independent;Needs assist;History of Falls (last six months)             Mobility Comments: use of cane and furniture walking, stumbles but only 1 fall ADLs Comments: reports independent with dressing, poor endurance to complete IADLs, daughter helps with shower        OT Problem List: Decreased strength;Decreased activity tolerance;Impaired balance (sitting and/or standing);Decreased knowledge of use of DME or AE;Pain;Increased edema      OT Treatment/Interventions: Self-care/ADL training;Therapeutic exercise;Energy conservation;DME and/or AE instruction;Therapeutic activities;Balance training    OT Goals(Current goals can be found in the care plan section) Acute Rehab OT Goals Patient Stated Goal: daughter wants rehab, pt wants to be able to walk with a cane again OT Goal Formulation: With patient/family Time For Goal Achievement: 02/17/23 Potential to Achieve Goals: Good  OT Frequency: Min 1X/week     Co-evaluation              AM-PAC OT "6 Clicks" Daily Activity     Outcome Measure Help from another person eating meals?: None Help from another person taking care of personal grooming?: A Little Help from another person toileting, which includes using toliet, bedpan, or urinal?: A Little Help from another person bathing (including washing, rinsing, drying)?: A Little Help from another person to put on and taking off regular upper body clothing?: A Little Help from another person to put on and taking off regular lower body clothing?: A Lot 6 Click Score: 18   End of Session Equipment Utilized During Treatment: Rolling walker (2 wheels);Gait belt  Activity Tolerance: Patient tolerated treatment well Patient left: in chair;with call bell/phone within reach;with chair alarm set;with family/visitor present  OT Visit Diagnosis: Other abnormalities of gait and mobility (R26.89);Unsteadiness on feet (R26.81)                Time: 1610-9604 OT Time Calculation (min): 27 min Charges:  OT General Charges $OT Visit: 1 Visit OT Evaluation $OT Eval Low Complexity: 1 Low OT Treatments $Self Care/Home Management : 8-22 mins  Bradd Canary, OTR/L Acute Rehab Services Office: [phone]   Lorre Munroe 02/03/2023, 12:16 PM

## 2023-02-03 NOTE — [place] FL2 (Signed)
[place] MEDICAID FL2 LEVEL OF CARE FORM     IDENTIFICATION  Patient Name: [name] Birthdate: November 01, 1930 Sex: female Admission Date (Current Location): 02/01/2023  [facility] and [place] Number:  Producer, television/film/video and Address:  [facility] [place], [address] [place], [place] [address]      Provider Number: [id number]  Attending Physician Name and Address:  Narda Bonds, MD  Relative Name and Phone Number:       Current Level of Care: Hospital Recommended Level of Care: Skilled Nursing Facility Prior Approval Number:    Date Approved/Denied:   PASRR Number: [id number]  Discharge Plan: SNF    Current Diagnoses: Patient Active Problem List   Diagnosis Date Noted   Fall at home, initial encounter 02/01/2023   2nd degree AV block 02/01/2023   Bradycardia 02/01/2023   Hypokalemia 02/01/2023   Renal insufficiency 02/01/2023   History of DVT (deep vein thrombosis) 02/01/2023   Unsteady gait 05/31/2019   Rheumatoid arthritis (HCC) 05/31/2019   Hyperlipidemia 05/31/2019   Essential hypertension 05/31/2019   Sleep apnea    DVT (deep venous thrombosis) (HCC) 11/01/2015   Acute DVT (deep venous thrombosis) (HCC) 11/01/2015    Orientation RESPIRATION BLADDER Height & Weight     Self, Time, Situation, Place  Normal Continent Weight: 130 lb 4.7 oz (59.1 kg) Height:  5\' 4"  (162.6 cm)  BEHAVIORAL SYMPTOMS/MOOD NEUROLOGICAL BOWEL NUTRITION STATUS      Continent    AMBULATORY STATUS COMMUNICATION OF NEEDS Skin   Limited Assist Verbally Normal                       Personal Care Assistance Level of Assistance  Bathing, Feeding, Dressing Bathing Assistance: Limited assistance Feeding assistance: Limited assistance Dressing Assistance: Limited assistance     Functional Limitations Info  Sight, Hearing, Speech Sight Info: Adequate Hearing Info: Adequate Speech Info: Adequate    SPECIAL CARE FACTORS FREQUENCY  PT (By licensed PT),  OT (By licensed OT)                    Contractures Contractures Info: Not present    Additional Factors Info  Code Status Code Status Info: DNR             Current Medications (02/03/2023):  This is the current hospital active medication list Current Facility-Administered Medications  Medication Dose Route Frequency Provider Last Rate Last Admin   acetaminophen (TYLENOL) tablet 650 mg  650 mg Oral Q6H PRN Charlsie Quest, MD   650 mg at 02/02/23 0114   Or   acetaminophen (TYLENOL) suppository 650 mg  650 mg Rectal Q6H PRN Charlsie Quest, MD       amLODipine (NORVASC) tablet 2.5 mg  2.5 mg Oral Daily John Giovanni, MD   2.5 mg at 02/03/23 5621   apixaban (ELIQUIS) tablet 5 mg  5 mg Oral BID Darreld Mclean R, MD   5 mg at 02/03/23 3086   irbesartan (AVAPRO) tablet 150 mg  150 mg Oral Daily Narda Bonds, MD       And   hydrochlorothiazide (HYDRODIURIL) tablet 12.5 mg  12.5 mg Oral Daily Narda Bonds, MD   12.5 mg at 02/03/23 5784   influenza vaccine adjuvanted (FLUAD) injection 0.5 mL  0.5 mL Intramuscular Tomorrow-1000 Narda Bonds, MD       ondansetron [facility]) tablet 4 mg  4 mg Oral Q6H PRN Charlsie Quest,  MD       Or   ondansetron (ZOFRAN) injection 4 mg  4 mg Intravenous Q6H PRN Charlsie Quest, MD       senna-docusate (Senokot-S) tablet 1 tablet  1 tablet Oral QHS PRN Charlsie Quest, MD       simvastatin (ZOCOR) tablet 40 mg  40 mg Oral q1800 Darreld Mclean R, MD   40 mg at 02/02/23 1713   sodium chloride flush (NS) 0.9 % injection 3 mL  3 mL Intravenous Q12H Charlsie Quest, MD   3 mL at 02/03/23 4166     Discharge Medications: Please see discharge summary for a list of discharge medications.  Relevant Imaging Results:  Relevant Lab Results:   Additional Information SS# [id number]  Deatra Robinson, [place]

## 2023-02-03 NOTE — Progress Notes (Signed)
Cardiology Office Note:  .   Date:  02/03/2023  ID:  [name], DOB [birth year], MRN [id number] PCP: Andi Devon, MD  [facility] Providers Cardiologist:  Maisie Fus, MD   History of Present Illness: [name]   [name] is a 89 y.o. 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 Patient is followed by Dr. Wyline Mood and presents today for a hospital follow up appointment.   Patient was admitted from 9/28-10/2  after she had a fall at home. Patient had reportedly attempted to get out of bed and had a fall at home. EKG showed sinus bradycardia with 2nd degree AV block (Mobitz I). Cardiology was consulted due to concern for bradycardia causing her fall. However, patient's description of the fall sounded mechanical. Recommended avoidance of AV nodal medications. There was no reversible etiology and it was suspected that age-related conduction changes were responsible for bradycardia and Wenckebach. Echocardiogram on 02/02/23 showed EF 60-65%, no regional wall motion abnormalities, normal RV function, moderately elevated PA systolic pressure, mild MR, moderate TR.   On interview, patient reports that she went to the hospital after she had a fall while getting out of bed.  She had stood up in the morning, and started to feel dizzy/woozy.  She ended up falling but did not hit her head.  She denies syncope. Denies palpitations, chest pain.  She went to the hospital and was discharged to a rehab facility.  While living at the rehab facility, she has been working with PT, OT.  Reports that her therapies are going well.  Her activity is somewhat limited because she needs to have staff with her whenever she walks, but she is pleased with her progress. She denies recurrence of near syncope, falls, dizziness.  Denies dizziness upon standing. She denies shortness of breath.  Has chronic ankle edema that is unchanged.  Usually improves if she wears compression  stockings. She brought a BP and HR log from her facility that shows HR in the 50s-80s. BP predominantly 120s-140s/70s. BP is low today in clinic, but patient feels fine.   ROS: Denies syncope, near syncope, dizziness upon standing, falls, chest pain, shortness of breath. Has chronic ankle edema   Studies Reviewed: .   Cardiac Studies & Procedures   CARDIAC CATHETERIZATION  CARDIAC CATHETERIZATION 01/27/2003   STRESS TESTS  MYOCARDIAL PERFUSION IMAGING 12/22/2002   ECHOCARDIOGRAM  ECHOCARDIOGRAM COMPLETE 02/02/2023  Narrative ECHOCARDIOGRAM REPORT    Patient Name:   [name] Date of Exam: 02/02/2023 Medical Rec #:  [id number]     Height:       64.0 in Accession #:    [id number]    Weight:       128.5 lb Date of Birth:  05-05-1931      BSA:          1.621 m Patient Age:    89 years      BP:           146/57 mmHg Patient Gender: F             HR:           44 bpm. Exam Location:  Inpatient  Procedure: 2D Echo, Cardiac Doppler and Color Doppler  Indications:    Heart block, 2nd degree I44.1  History:        Patient has no prior history of Echocardiogram examinations. Arrythmias:Bradycardia; Risk Factors:Hypertension, Dyslipidemia and Sleep Apnea.  Sonographer:  Lucendia Herrlich Referring Phys: [id number] VISHAL R PATEL  IMPRESSIONS   1. Left ventricular ejection fraction, by estimation, is 60 to 65%. The left ventricle has normal function. The left ventricle has no regional wall motion abnormalities. Left ventricular diastolic parameters are indeterminate. 2. Right ventricular systolic function is normal. The right ventricular size is mildly enlarged. There is moderately elevated pulmonary artery systolic pressure. The estimated right ventricular systolic pressure is 49.0 mmHg. 3. Left atrial size was mildly dilated. 4. Right atrial size was moderately dilated. 5. The mitral valve is normal in structure. Mild mitral valve regurgitation. No evidence of mitral stenosis. 6.  Tricuspid valve regurgitation is moderate. 7. The aortic valve was not well visualized. Aortic valve regurgitation is trivial. Aortic valve sclerosis/calcification is present, without any evidence of aortic stenosis. 8. The inferior vena cava is dilated in size with >50% respiratory variability, suggesting right atrial pressure of 8 mmHg.  FINDINGS Left Ventricle: Left ventricular ejection fraction, by estimation, is 60 to 65%. The left ventricle has normal function. The left ventricle has no regional wall motion abnormalities. The left ventricular internal cavity size was normal in size. There is no left ventricular hypertrophy. Left ventricular diastolic parameters are indeterminate.  Right Ventricle: The right ventricular size is mildly enlarged. Right vetricular wall thickness was not well visualized. Right ventricular systolic function is normal. There is moderately elevated pulmonary artery systolic pressure. The tricuspid regurgitant velocity is 3.20 m/s, and with an assumed right atrial pressure of 8 mmHg, the estimated right ventricular systolic pressure is 49.0 mmHg.  Left Atrium: Left atrial size was mildly dilated.  Right Atrium: Right atrial size was moderately dilated.  Pericardium: There is no evidence of pericardial effusion.  Mitral Valve: The mitral valve is normal in structure. Mild mitral valve regurgitation. No evidence of mitral valve stenosis.  Tricuspid Valve: The tricuspid valve is normal in structure. Tricuspid valve regurgitation is moderate.  Aortic Valve: The aortic valve was not well visualized. Aortic valve regurgitation is trivial. Aortic valve sclerosis/calcification is present, without any evidence of aortic stenosis. Aortic valve peak gradient measures 5.4 mmHg.  Pulmonic Valve: The pulmonic valve was grossly normal. Pulmonic valve regurgitation is trivial.  Aorta: The aortic root is normal in size and structure.  Venous: The inferior vena cava is dilated  in size with greater than 50% respiratory variability, suggesting right atrial pressure of 8 mmHg.  IAS/Shunts: The interatrial septum was not well visualized.   LEFT VENTRICLE PLAX 2D LVIDd:         4.30 cm   Diastology LVIDs:         2.50 cm   LV e' medial:    7.29 cm/s LV PW:         0.90 cm   LV E/e' medial:  14.1 LV IVS:        0.90 cm   LV e' lateral:   10.40 cm/s LVOT diam:     2.10 cm   LV E/e' lateral: 9.9 LV SV:         76 LV SV Index:   47 LVOT Area:     3.46 cm   RIGHT VENTRICLE             IVC RV S prime:     13.70 cm/s  IVC diam: 2.40 cm TAPSE (M-mode): 1.5 cm  LEFT ATRIUM             Index        RIGHT ATRIUM  Index LA diam:        3.50 cm 2.16 cm/m   RA Area:     23.30 cm LA Vol (A2C):   45.6 ml 28.13 ml/m  RA Volume:   69.70 ml  42.99 ml/m LA Vol (A4C):   58.0 ml 35.78 ml/m LA Biplane Vol: 55.9 ml 34.48 ml/m AORTIC VALVE                 PULMONIC VALVE AV Area (Vmax): 2.42 cm     PR End Diast Vel: 1.56 msec AV Vmax:        116.00 cm/s AV Peak Grad:   5.4 mmHg LVOT Vmax:      81.00 cm/s LVOT Vmean:     50.267 cm/s LVOT VTI:       0.219 m  AORTA Ao Root diam: 3.20 cm Ao Asc diam:  3.10 cm  MITRAL VALVE                TRICUSPID VALVE MV Area (PHT): 4.89 cm     TR Peak grad:   41.0 mmHg MV Decel Time: 155 msec     TR Vmax:        320.00 cm/s MR Peak grad: 25.7 mmHg MR Vmax:      253.50 cm/s   SHUNTS MV E velocity: 103.00 cm/s  Systemic VTI:  0.22 m MV A velocity: 95.60 cm/s   Systemic Diam: 2.10 cm MV E/A ratio:  1.08  Epifanio Lesches MD Electronically signed by Epifanio Lesches MD Signature Date/Time: 02/02/2023/1:43:37 PM    Final             Risk Assessment/Calculations:             Physical Exam:   VS:  There were no vitals taken for this visit.   Wt Readings from Last 3 Encounters:  02/03/23 130 lb 4.7 oz (59.1 kg)  07/08/19 142 lb (64.4 kg)  06/11/19 138 lb (62.6 kg)    GEN: Elderly female, sitting upright  in her wheelchair. No acute distress NECK: No JVD; No carotid bruits CARDIAC: RRR, no murmurs, rubs, gallops. Radial pulses 2+ bilaterally  RESPIRATORY:  Clear to auscultation without rales, wheezing or rhonchi. Normal work of breathing on room air  ABDOMEN: Soft, non-tender, non-distended EXTREMITIES:  2+ edema in BLE; No deformity   ASSESSMENT AND PLAN: .    Wenckebach  Bradycardia  - Noted on EKG and telemetry while admitted to [facility] after mechanical fall. She was evaluated by cardiology who reviewed telemetry and determined that there was no indication for pacemaker.  - Review of vital signs from rehab facility showed HR in the 50s-80s  - Patient is asymptomatic- no fatigue/sluggishness, syncope, near syncope  - Considered ordering cardiac monitor, but as patient was recently admitted and on telemetry for several days and as she is asymptomatic, will hold off for now  - Avoid AV nodal medications   Mild MR  Moderate TR  -Noted on echo cardiogram from 02/02/2023 - Patient has chronica lower extremity edema, is otherwise euvolemic on exam. Denies shortness of breath, orthopnea. Is on hydrochlorothiazide  - No indication for intervention at this time. Given patient's advanced age, I do not think that monitoring with repeat echocardiograms will change treatment plan   History of DVT on Eliquis  - Continue eliquis 5 mg BID- age 42, weight 61 kg, creatinine 1.17 - Patient denies bleeding. Hemoglobin was 9.6 on 9/30 - Ordered CBC for monitoring   Lower Extremity  Edema - Chronic - Patient reports swelling is usually well controlled with compression stockings  - Continue olmesartan-hydrochlorothiazide combo pill  - ordered BMP for monitoring   HTN  -BP 102/58 today in clinic. At her facility, usually in the 120s-140s/70s. She denies syncope, near syncope, dizziness  - Currently on olmesartan-hydrochlorothiazide 20-12.5 mg daily and amlodipine 2.5 mg daily - Ordered BMP for monitoring    - Continue current BP medications for now. I asked daughter to continue to obtain vital signs from facility and to send to [place]. If BP low, consider stopping amlodipine   HLD  - Continue simvastatin 40 mg daily   Dispo: 6 months with Dr. Wyline Mood   Signed, Jonita Albee, PA-C

## 2023-02-03 NOTE — TOC Initial Note (Signed)
Transition of Care [facility]) - Initial/Assessment Note    Patient Details  Name: [name] MRN: [id number] Date of Birth: 1930-07-14  Transition of [facility]) CM/SW Contact:    Deatra Robinson, [place] Phone Number: 02/03/2023, 12:29 PM  Clinical Narrative: spoke with pt's dtr [name] [phone] re PT recommendation for SNF. Dtr states family has been looking at facilities prior to admission and the preference is [facility]. Reviewed SNF placement process and answered questions. Pt shows active with [employer] (OON with [facility]) but dtr reports pt recently changed to [facility] Medicare with an active start date of 10/1. Will provide offers to dtr as available.   Dellie Burns, MSW, LCSW [phone] (coverage)         Expected Discharge Plan: Skilled Nursing Facility Barriers to Discharge: Continued Medical Work up, SNF Pending bed offer, Insurance Authorization   Patient Goals and CMS Choice   CMS Medicare.gov Compare Post Acute Care list provided to:: Patient Choice offered to / list presented to : Patient [place] ownership interest in [facility].provided to:: Patient    Expected Discharge Plan and Services     Post Acute Care Choice: Skilled Nursing Facility Living arrangements for the past 2 months: Single Family Home                                      Prior Living Arrangements/Services Living arrangements for the past 2 months: Single Family Home Lives with:: Self Patient language and need for interpreter reviewed:: No        Need for Family Participation in Patient Care: Yes (Comment) Care giver support system in place?: Yes (comment)   Criminal Activity/Legal Involvement Pertinent to Current Situation/Hospitalization: No - Comment as needed  Activities of Daily Living   ADL Screening (condition at time of admission) Does the patient have a NEW difficulty with bathing/dressing/toileting/self-feeding that is expected to last >3 days?:  No Does the patient have a NEW difficulty with getting in/out of bed, walking, or climbing stairs that is expected to last >3 days?: No Does the patient have a NEW difficulty with communication that is expected to last >3 days?: No Is the patient deaf or have difficulty hearing?: No Does the patient have difficulty seeing, even when wearing glasses/contacts?: No Does the patient have difficulty concentrating, remembering, or making decisions?: No  Permission Sought/Granted Permission sought to share information with : Oceanographer granted to share information with : Yes, Verbal Permission Granted              Emotional Assessment       Orientation: : Oriented to Self, Oriented to Place, Oriented to  Time, Oriented to Situation Alcohol / Substance Use: Not Applicable Psych Involvement: No (comment)  Admission diagnosis:  Bradycardia [R00.1] Atrial fibrillation with slow ventricular response (HCC) [I48.91] Fall at home, initial encounter [W19.Lorne Skeens, Y92.009] Patient Active Problem List   Diagnosis Date Noted   Fall at home, initial encounter 02/01/2023   2nd degree AV block 02/01/2023   Bradycardia 02/01/2023   Hypokalemia 02/01/2023   Renal insufficiency 02/01/2023   History of DVT (deep vein thrombosis) 02/01/2023   Unsteady gait 05/31/2019   Rheumatoid arthritis (HCC) 05/31/2019   Hyperlipidemia 05/31/2019   Essential hypertension 05/31/2019   Sleep apnea    DVT (deep venous thrombosis) (HCC) 11/01/2015   Acute DVT (deep venous thrombosis) (HCC) 11/01/2015   PCP:  Andi Devon, MD Pharmacy:   [facility] [phone] - [place], [place] - [address] AT [facility] [address] [place] [address] Phone: [phone] Fax: [phone]     Social Determinants of Health (SDOH) Social History: SDOH Screenings   Food Insecurity: No Food Insecurity (02/01/2023)  Housing: Low Risk  (02/01/2023)   Transportation Needs: No Transportation Needs (02/01/2023)  Utilities: Not At Risk (02/01/2023)  Tobacco Use: Low Risk  (02/01/2023)   SDOH Interventions:     Readmission Risk Interventions     No data to display

## 2023-02-03 NOTE — Progress Notes (Signed)
Mobility Specialist Progress Note:    02/03/23 1621  Mobility  Activity Ambulated with assistance in hallway  Level of Assistance Contact guard assist, steadying assist  Assistive Device Front wheel walker  Distance Ambulated (ft) 150 ft  Activity Response Tolerated well  Mobility Referral Yes  $Mobility charge 1 Mobility  Mobility Specialist Start Time (ACUTE ONLY) 1425  Mobility Specialist Stop Time (ACUTE ONLY) 1437  Mobility Specialist Time Calculation (min) (ACUTE ONLY) 12 min   Received pt in chair having no complaints and agreeable to mobility. Pt was asymptomatic throughout ambulation and returned to room w/o fault. Left in chair w/ call bell in reach and all needs met.   Thompson Grayer Mobility Specialist  Please contact vis Secure Chat or  Rehab Office [phone]

## 2023-02-03 NOTE — Progress Notes (Addendum)
PROGRESS NOTE    LAYNE LEBON  NUU:[id number] DOB: Nov 02, 1930 DOA: 02/01/2023 PCP: Andi Devon, MD   Brief Narrative: NIJA KOOPMAN is a 89 y.o. female with a history of sinus bradycardia with first-degree AV block, recurrent DVT on Eliquis, hypertension, hyperlipidemia, rheumatoid arthritis.  Patient presented secondary to fall after getting out of bed with no loss of consciousness but with some lightheadedness prior to falling. Echo Cardiology was consulted with recommendation for no further workup or management. Transthoracic Echocardiogram significant for normal LVEF of 60-65% with evidence of elevated pulmonary artery pressures, enlarged right ventricle and moderate tricuspid regurgitation.   Assessment and Plan:  Bradycardia Type 1 second degree AV block Cardiology consulted. No workup per cardiology. -Continue telemetry -Refer to cardiology outpatient.  Hypokalemia Potassium of 3.3 on admission. Resolved with potassium supplementation.  Hypomagnesemia Magnesium of 1.6. -Magnesium supplementation  Fall Initial concern this could have been cardiac related. Per cardiology, unlikely related to bradyarrhythmia. PT/OT consulted.  AKI Unknown baseline. Last creatinine of 1.58 from 2021. Creatinine of 1.29 on admission. Improvement to 1.17.  Primary hypertension Patient is on olmesartan-hydrochlorothiazide and amlodipine as an outpatient. Antihypertensives held on admission.  Hyperlipidemia -Continue simvastatin  Chronic anemia Hemoglobin is stable.  History of DVT -Continue Eliquis  Pulmonary artery hypertension Noted on Transthoracic Echocardiogram. Outpatient cardiology follow-up.   DVT prophylaxis: Eliquis Code Status:   Code Status: Limited: Do not attempt resuscitation (DNR) -DNR-LIMITED -Do Not Intubate/DNI  Family Communication: Daughter at bedside Disposition Plan: Discharge likely to SNF pending bed availability. Medically stable.   Consultants:   Cardiology  Procedures:  Echo  Antimicrobials: None    Subjective: Feeling better today.  Objective: BP 113/73 (BP Location: Left Arm)   Pulse 69   Temp 97.9 F (36.6 C) (Oral)   Resp 14   Ht 5\' 4"  (1.626 m)   Wt 59.1 kg   SpO2 98%   BMI 22.36 kg/m   Examination:  General exam: Appears calm and comfortable Respiratory system: Clear to auscultation. Respiratory effort normal. Cardiovascular system: S1 & S2 heard, RRR.  Gastrointestinal system: Abdomen is nondistended, soft and nontender. Normal bowel sounds heard. Central nervous system: Alert and oriented. No focal neurological deficits. Musculoskeletal: BLE edema. No calf tenderness Psychiatry: Judgement and insight appear normal. Mood & affect appropriate.    Data Reviewed: I have personally reviewed following labs and imaging studies  CBC Lab Results  Component Value Date   WBC 6.4 02/03/2023   RBC 3.16 (L) 02/03/2023   HGB 9.6 (L) 02/03/2023   HCT 29.0 (L) 02/03/2023   MCV 91.8 02/03/2023   MCH 30.4 02/03/2023   PLT 200 02/03/2023   MCHC 33.1 02/03/2023   RDW 14.5 02/03/2023   LYMPHSABS 1.4 05/10/2019   MONOABS 0.6 05/10/2019   EOSABS 0.0 05/10/2019   BASOSABS 0.0 05/10/2019     Last metabolic panel Lab Results  Component Value Date   NA 140 02/02/2023   K 4.2 02/02/2023   CL 110 02/02/2023   CO2 24 02/02/2023   BUN 24 (H) 02/02/2023   CREATININE 1.17 (H) 02/02/2023   GLUCOSE 94 02/02/2023   GFRNONAA 44 (L) 02/02/2023   GFRAA 34 (L) 05/10/2019   CALCIUM 8.7 (L) 02/02/2023   PROT 7.2 11/01/2015   ALBUMIN 3.4 (L) 11/01/2015   BILITOT 0.3 11/01/2015   ALKPHOS 52 11/01/2015   AST 19 11/01/2015   ALT 12 (L) 11/01/2015   ANIONGAP 6 02/02/2023    GFR: Estimated Creatinine Clearance: 27.6 mL/min (A) (  PROGRESS NOTE    LAYNE LEBON  NUU:[id number] DOB: Nov 02, 1930 DOA: 02/01/2023 PCP: Andi Devon, MD   Brief Narrative: NIJA KOOPMAN is a 89 y.o. female with a history of sinus bradycardia with first-degree AV block, recurrent DVT on Eliquis, hypertension, hyperlipidemia, rheumatoid arthritis.  Patient presented secondary to fall after getting out of bed with no loss of consciousness but with some lightheadedness prior to falling. Echo Cardiology was consulted with recommendation for no further workup or management. Transthoracic Echocardiogram significant for normal LVEF of 60-65% with evidence of elevated pulmonary artery pressures, enlarged right ventricle and moderate tricuspid regurgitation.   Assessment and Plan:  Bradycardia Type 1 second degree AV block Cardiology consulted. No workup per cardiology. -Continue telemetry -Refer to cardiology outpatient.  Hypokalemia Potassium of 3.3 on admission. Resolved with potassium supplementation.  Hypomagnesemia Magnesium of 1.6. -Magnesium supplementation  Fall Initial concern this could have been cardiac related. Per cardiology, unlikely related to bradyarrhythmia. PT/OT consulted.  AKI Unknown baseline. Last creatinine of 1.58 from 2021. Creatinine of 1.29 on admission. Improvement to 1.17.  Primary hypertension Patient is on olmesartan-hydrochlorothiazide and amlodipine as an outpatient. Antihypertensives held on admission.  Hyperlipidemia -Continue simvastatin  Chronic anemia Hemoglobin is stable.  History of DVT -Continue Eliquis  Pulmonary artery hypertension Noted on Transthoracic Echocardiogram. Outpatient cardiology follow-up.   DVT prophylaxis: Eliquis Code Status:   Code Status: Limited: Do not attempt resuscitation (DNR) -DNR-LIMITED -Do Not Intubate/DNI  Family Communication: Daughter at bedside Disposition Plan: Discharge likely to SNF pending bed availability. Medically stable.   Consultants:   Cardiology  Procedures:  Echo  Antimicrobials: None    Subjective: Feeling better today.  Objective: BP 113/73 (BP Location: Left Arm)   Pulse 69   Temp 97.9 F (36.6 C) (Oral)   Resp 14   Ht 5\' 4"  (1.626 m)   Wt 59.1 kg   SpO2 98%   BMI 22.36 kg/m   Examination:  General exam: Appears calm and comfortable Respiratory system: Clear to auscultation. Respiratory effort normal. Cardiovascular system: S1 & S2 heard, RRR.  Gastrointestinal system: Abdomen is nondistended, soft and nontender. Normal bowel sounds heard. Central nervous system: Alert and oriented. No focal neurological deficits. Musculoskeletal: BLE edema. No calf tenderness Psychiatry: Judgement and insight appear normal. Mood & affect appropriate.    Data Reviewed: I have personally reviewed following labs and imaging studies  CBC Lab Results  Component Value Date   WBC 6.4 02/03/2023   RBC 3.16 (L) 02/03/2023   HGB 9.6 (L) 02/03/2023   HCT 29.0 (L) 02/03/2023   MCV 91.8 02/03/2023   MCH 30.4 02/03/2023   PLT 200 02/03/2023   MCHC 33.1 02/03/2023   RDW 14.5 02/03/2023   LYMPHSABS 1.4 05/10/2019   MONOABS 0.6 05/10/2019   EOSABS 0.0 05/10/2019   BASOSABS 0.0 05/10/2019     Last metabolic panel Lab Results  Component Value Date   NA 140 02/02/2023   K 4.2 02/02/2023   CL 110 02/02/2023   CO2 24 02/02/2023   BUN 24 (H) 02/02/2023   CREATININE 1.17 (H) 02/02/2023   GLUCOSE 94 02/02/2023   GFRNONAA 44 (L) 02/02/2023   GFRAA 34 (L) 05/10/2019   CALCIUM 8.7 (L) 02/02/2023   PROT 7.2 11/01/2015   ALBUMIN 3.4 (L) 11/01/2015   BILITOT 0.3 11/01/2015   ALKPHOS 52 11/01/2015   AST 19 11/01/2015   ALT 12 (L) 11/01/2015   ANIONGAP 6 02/02/2023    GFR: Estimated Creatinine Clearance: 27.6 mL/min (A) (  by C-G formula based on SCr of 1.17 mg/dL (H)).  No results found for this or any previous visit (from the past 240 hour(s)).    Radiology Studies: ECHOCARDIOGRAM COMPLETE  Result Date:  02/02/2023    ECHOCARDIOGRAM REPORT   Patient Name:   ALMAS RAKE Date of Exam: 02/02/2023 Medical Rec #:  [id number]     Height:       64.0 in Accession #:    [id number]    Weight:       128.5 lb Date of Birth:  22-Jun-1930      BSA:          1.621 m Patient Age:    89 years      BP:           146/57 mmHg Patient Gender: F             HR:           44 bpm. Exam Location:  Inpatient Procedure: 2D Echo, Cardiac Doppler and Color Doppler Indications:    Heart block, 2nd degree I44.1  History:        Patient has no prior history of Echocardiogram examinations.                 Arrythmias:Bradycardia; Risk Factors:Hypertension, Dyslipidemia                 and Sleep Apnea.  Sonographer:    Lucendia Herrlich Referring Phys: [id number] VISHAL R PATEL IMPRESSIONS  1. Left ventricular ejection fraction, by estimation, is 60 to 65%. The left ventricle has normal function. The left ventricle has no regional wall motion abnormalities. Left ventricular diastolic parameters are indeterminate.  2. Right ventricular systolic function is normal. The right ventricular size is mildly enlarged. There is moderately elevated pulmonary artery systolic pressure. The estimated right ventricular systolic pressure is 49.0 mmHg.  3. Left atrial size was mildly dilated.  4. Right atrial size was moderately dilated.  5. The mitral valve is normal in structure. Mild mitral valve regurgitation. No evidence of mitral stenosis.  6. Tricuspid valve regurgitation is moderate.  7. The aortic valve was not well visualized. Aortic valve regurgitation is trivial. Aortic valve sclerosis/calcification is present, without any evidence of aortic stenosis.  8. The inferior vena cava is dilated in size with >50% respiratory variability, suggesting right atrial pressure of 8 mmHg. FINDINGS  Left Ventricle: Left ventricular ejection fraction, by estimation, is 60 to 65%. The left ventricle has normal function. The left ventricle has no regional wall motion  abnormalities. The left ventricular internal cavity size was normal in size. There is  no left ventricular hypertrophy. Left ventricular diastolic parameters are indeterminate. Right Ventricle: The right ventricular size is mildly enlarged. Right vetricular wall thickness was not well visualized. Right ventricular systolic function is normal. There is moderately elevated pulmonary artery systolic pressure. The tricuspid regurgitant velocity is 3.20 m/s, and with an assumed right atrial pressure of 8 mmHg, the estimated right ventricular systolic pressure is 49.0 mmHg. Left Atrium: Left atrial size was mildly dilated. Right Atrium: Right atrial size was moderately dilated. Pericardium: There is no evidence of pericardial effusion. Mitral Valve: The mitral valve is normal in structure. Mild mitral valve regurgitation. No evidence of mitral valve stenosis. Tricuspid Valve: The tricuspid valve is normal in structure. Tricuspid valve regurgitation is moderate. Aortic Valve: The aortic valve was not well visualized. Aortic valve regurgitation is trivial. Aortic valve sclerosis/calcification is present, without any evidence of aortic  by C-G formula based on SCr of 1.17 mg/dL (H)).  No results found for this or any previous visit (from the past 240 hour(s)).    Radiology Studies: ECHOCARDIOGRAM COMPLETE  Result Date:  02/02/2023    ECHOCARDIOGRAM REPORT   Patient Name:   ALMAS RAKE Date of Exam: 02/02/2023 Medical Rec #:  [id number]     Height:       64.0 in Accession #:    [id number]    Weight:       128.5 lb Date of Birth:  22-Jun-1930      BSA:          1.621 m Patient Age:    89 years      BP:           146/57 mmHg Patient Gender: F             HR:           44 bpm. Exam Location:  Inpatient Procedure: 2D Echo, Cardiac Doppler and Color Doppler Indications:    Heart block, 2nd degree I44.1  History:        Patient has no prior history of Echocardiogram examinations.                 Arrythmias:Bradycardia; Risk Factors:Hypertension, Dyslipidemia                 and Sleep Apnea.  Sonographer:    Lucendia Herrlich Referring Phys: [id number] VISHAL R PATEL IMPRESSIONS  1. Left ventricular ejection fraction, by estimation, is 60 to 65%. The left ventricle has normal function. The left ventricle has no regional wall motion abnormalities. Left ventricular diastolic parameters are indeterminate.  2. Right ventricular systolic function is normal. The right ventricular size is mildly enlarged. There is moderately elevated pulmonary artery systolic pressure. The estimated right ventricular systolic pressure is 49.0 mmHg.  3. Left atrial size was mildly dilated.  4. Right atrial size was moderately dilated.  5. The mitral valve is normal in structure. Mild mitral valve regurgitation. No evidence of mitral stenosis.  6. Tricuspid valve regurgitation is moderate.  7. The aortic valve was not well visualized. Aortic valve regurgitation is trivial. Aortic valve sclerosis/calcification is present, without any evidence of aortic stenosis.  8. The inferior vena cava is dilated in size with >50% respiratory variability, suggesting right atrial pressure of 8 mmHg. FINDINGS  Left Ventricle: Left ventricular ejection fraction, by estimation, is 60 to 65%. The left ventricle has normal function. The left ventricle has no regional wall motion  abnormalities. The left ventricular internal cavity size was normal in size. There is  no left ventricular hypertrophy. Left ventricular diastolic parameters are indeterminate. Right Ventricle: The right ventricular size is mildly enlarged. Right vetricular wall thickness was not well visualized. Right ventricular systolic function is normal. There is moderately elevated pulmonary artery systolic pressure. The tricuspid regurgitant velocity is 3.20 m/s, and with an assumed right atrial pressure of 8 mmHg, the estimated right ventricular systolic pressure is 49.0 mmHg. Left Atrium: Left atrial size was mildly dilated. Right Atrium: Right atrial size was moderately dilated. Pericardium: There is no evidence of pericardial effusion. Mitral Valve: The mitral valve is normal in structure. Mild mitral valve regurgitation. No evidence of mitral valve stenosis. Tricuspid Valve: The tricuspid valve is normal in structure. Tricuspid valve regurgitation is moderate. Aortic Valve: The aortic valve was not well visualized. Aortic valve regurgitation is trivial. Aortic valve sclerosis/calcification is present, without any evidence of aortic

## 2023-02-04 DIAGNOSIS — R296 Repeated falls: Secondary | ICD-10-CM | POA: Diagnosis not present

## 2023-02-04 DIAGNOSIS — W19XXXA Unspecified fall, initial encounter: Secondary | ICD-10-CM | POA: Diagnosis not present

## 2023-02-04 DIAGNOSIS — R001 Bradycardia, unspecified: Secondary | ICD-10-CM | POA: Diagnosis not present

## 2023-02-04 DIAGNOSIS — I441 Atrioventricular block, second degree: Secondary | ICD-10-CM | POA: Diagnosis not present

## 2023-02-04 DIAGNOSIS — I10 Essential (primary) hypertension: Secondary | ICD-10-CM | POA: Diagnosis not present

## 2023-02-04 LAB — POTASSIUM
Potassium: 5.4 mmol/L — ABNORMAL HIGH (ref 3.5–5.1)
Potassium: 4 mmol/L (ref 3.5–5.1)

## 2023-02-04 LAB — MAGNESIUM: Magnesium: 1.8 mg/dL (ref 1.7–2.4)

## 2023-02-04 NOTE — Plan of Care (Signed)
  Problem: Education: Goal: Knowledge of General Education information will improve Description: Including pain rating scale, medication(s)/side effects and non-pharmacologic comfort measures Outcome: Progressing   Problem: Nutrition: Goal: Adequate nutrition will be maintained Outcome: Progressing   Problem: Coping: Goal: Level of anxiety will decrease Outcome: Progressing   Problem: Elimination: Goal: Will not experience complications related to bowel motility Outcome: Progressing Goal: Will not experience complications related to urinary retention Outcome: Progressing   Problem: Health Behavior/Discharge Planning: Goal: Ability to manage health-related needs will improve Outcome: Not Progressing   Problem: Activity: Goal: Risk for activity intolerance will decrease Outcome: Not Progressing

## 2023-02-04 NOTE — Progress Notes (Signed)
PROGRESS NOTE    [name]  ZOX:[id number] DOB: 06/05/1930 DOA: 02/01/2023 PCP: Andi Devon, MD   Brief Narrative: [name] is a 89 y.o. female with a history of sinus bradycardia with first-degree AV block, recurrent DVT on Eliquis, hypertension, hyperlipidemia, rheumatoid arthritis.  Patient presented secondary to fall after getting out of bed with no loss of consciousness but with some lightheadedness prior to falling. Echo Cardiology was consulted with recommendation for no further workup or management. Transthoracic Echocardiogram significant for normal LVEF of 60-65% with evidence of elevated pulmonary artery pressures, enlarged right ventricle and moderate tricuspid regurgitation.   Assessment and Plan:  Bradycardia Type 1 second degree AV block Cardiology consulted. No workup per cardiology. Outpatient cardiology follow-up set up. -Continue telemetry  Hypokalemia Potassium of 3.3 on admission. Resolved with potassium supplementation. -Repeat potassium lab  Hypomagnesemia Magnesium of 1.6. Magnesium supplementation given. -Repeat magnesium lab  Fall Initial concern this could have been cardiac related. Per cardiology, unlikely related to bradyarrhythmia. PT/OT consulted.  AKI Unknown baseline. Last creatinine of 1.58 from 2021. Creatinine of 1.29 on admission. Improvement to 1.17.  Primary hypertension Patient is on olmesartan-hydrochlorothiazide and amlodipine as an outpatient. Antihypertensives held on admission.  Hyperlipidemia -Continue simvastatin  Chronic anemia Hemoglobin is stable.  History of DVT -Continue Eliquis  Pulmonary artery hypertension Noted on Transthoracic Echocardiogram. Outpatient cardiology follow-up.   DVT prophylaxis: Eliquis Code Status:   Code Status: Limited: Do not attempt resuscitation (DNR) -DNR-LIMITED -Do Not Intubate/DNI  Family Communication: None at bedside Disposition Plan: Discharge likely to SNF pending  bed availability. Medically stable.   Consultants:  Cardiology  Procedures:  Transthoracic Echocardiogram  Antimicrobials: None    Subjective: No concerns this morning.  Objective: BP (!) 101/53 (BP Location: Left Arm)   Pulse (!) 51   Temp 97.7 F (36.5 C) (Oral)   Resp 15   Ht 5\' 4"  (1.626 m)   Wt 61 kg   SpO2 96%   BMI 23.08 kg/m   Examination:  General exam: Appears calm and comfortable Respiratory system: Clear to auscultation. Respiratory effort normal. Cardiovascular system: S1 & S2 heard, slow heart rate. Normal rhythm. Gastrointestinal system: Abdomen is nondistended, soft and nontender. Normal bowel sounds heard. Central nervous system: Alert and oriented. No focal neurological deficits. Musculoskeletal: BLE pitting edema. No calf tenderness Psychiatry: Judgement and insight appear normal. Mood & affect appropriate.    Data Reviewed: I have personally reviewed following labs and imaging studies  CBC Lab Results  Component Value Date   WBC 6.4 02/03/2023   RBC 3.16 (L) 02/03/2023   HGB 9.6 (L) 02/03/2023   HCT 29.0 (L) 02/03/2023   MCV 91.8 02/03/2023   MCH 30.4 02/03/2023   PLT 200 02/03/2023   MCHC 33.1 02/03/2023   RDW 14.5 02/03/2023   LYMPHSABS 1.4 05/10/2019   MONOABS 0.6 05/10/2019   EOSABS 0.0 05/10/2019   BASOSABS 0.0 05/10/2019     Last metabolic panel Lab Results  Component Value Date   NA 140 02/02/2023   K 4.2 02/02/2023   CL 110 02/02/2023   CO2 24 02/02/2023   BUN 24 (H) 02/02/2023   CREATININE 1.17 (H) 02/02/2023   GLUCOSE 94 02/02/2023   GFRNONAA 44 (L) 02/02/2023   GFRAA 34 (L) 05/10/2019   CALCIUM 8.7 (L) 02/02/2023   PROT 7.2 11/01/2015   ALBUMIN 3.4 (L) 11/01/2015   BILITOT 0.3 11/01/2015   ALKPHOS 52 11/01/2015   AST 19 11/01/2015   ALT 12 (L)  11/01/2015   ANIONGAP 6 02/02/2023    GFR: Estimated Creatinine Clearance: 27.6 mL/min (A) (by C-G formula based on SCr of 1.17 mg/dL (H)).  No results found for  this or any previous visit (from the past 240 hour(s)).    Radiology Studies: No results found.    LOS: 0 days    Jacquelin Hawking, MD [facility] 02/04/2023, 11:27 AM   If 7PM-7AM, please contact night-coverage [email or web address]

## 2023-02-04 NOTE — TOC Progression Note (Signed)
Transition of Care [facility]) - Progression Note    Patient Details  Name: ZAYANNA PUNDT MRN: [id number] Date of Birth: 06/17/1930  Transition of Care [facility]) CM/SW Contact  Dellie Burns [place], [place] Phone Number: 02/04/2023, 11:23 AM  Clinical Narrative:   current bed offers provided to pt's dtr who has accepted [facility]. Confirmed with [place] at [facility] they show pt's [employer] policy ([id number]) as active and they will start auth as pt's policy is not managed by [facility]. Will provide updates as available.   Dellie Burns, MSW, LCSW [phone] (coverage)      Expected Discharge Plan: Skilled Nursing Facility Barriers to Discharge: Insurance Authorization  Expected Discharge Plan and Services     Post Acute Care Choice: Skilled Nursing Facility Living arrangements for the past 2 months: Single Family Home                                       Social Determinants of Health (SDOH) Interventions SDOH Screenings   Food Insecurity: No Food Insecurity (02/01/2023)  Housing: Low Risk  (02/01/2023)  Transportation Needs: No Transportation Needs (02/01/2023)  Utilities: Not At Risk (02/01/2023)  Tobacco Use: Low Risk  (02/01/2023)    Readmission Risk Interventions     No data to display

## 2023-02-04 NOTE — Progress Notes (Signed)
Mobility Specialist Progress Note:    02/04/23 1227  Mobility  Activity Ambulated with assistance in hallway  Level of Assistance Contact guard assist, steadying assist  Assistive Device Front wheel walker  Distance Ambulated (ft) 200 ft  Activity Response Tolerated well  Mobility Referral Yes  $Mobility charge 1 Mobility  Mobility Specialist Start Time (ACUTE ONLY) 1132  Mobility Specialist Stop Time (ACUTE ONLY) 1147  Mobility Specialist Time Calculation (min) (ACUTE ONLY) 15 min   Received pt in chair having no complaints and agreeable to mobility. C/o mild Knee pain, otherwise no c/o. Returned to room w/o fault. Left in chair w/ call bell in reach and all needs met.   Thompson Grayer Mobility Specialist  Please contact vis Secure Chat or  Rehab Office [phone]

## 2023-02-04 NOTE — Plan of Care (Signed)

## 2023-02-04 NOTE — Progress Notes (Signed)
Physical Therapy Treatment Patient Details Name: [name] MRN: [id number] DOB: 1930/11/19 Today's Date: 02/04/2023   History of Present Illness The pt is a 89 yo female presenting 9/28 after 1-2 falls with reports of general weakness. Cardiology reports rhythm has been 2nd degree mobitz I since admission with little concern for cardiac cause of fall. PMH includes: sinus bradycardia with first-degree AV block, recurrent DVT on chronic Eliquis, HTN, HLD, RA.    PT Comments  Patient progressing with ambulation distance/activity tolerance and able to complete toileting task with help for donning brief in standing.  She had some difficulty with RW managing it esp on turns, but also as tended to veer to R.  She needed assistance for keeping close to walker as she flexed forward and walker moved further away from her.  She remains high fall risk when walking unaided.  Continue to recommend inpatient rehab (<3 hours/day) at d/c.     If plan is discharge home, recommend the following: A little help with walking and/or transfers;A little help with bathing/dressing/bathroom;Assistance with cooking/housework;Direct supervision/assist for medications management;Direct supervision/assist for financial management;Assist for transportation;Supervision due to cognitive status;Help with stairs or ramp for entrance   Can travel by private vehicle     Yes  Equipment Recommendations  Rolling walker (2 wheels)    Recommendations for Other Services       Precautions / Restrictions Precautions Precautions: Fall     Mobility  Bed Mobility               General bed mobility comments: in bathroom upon PT entry    Transfers Overall transfer level: Needs assistance Equipment used: Rolling walker (2 wheels) Transfers: Sit to/from Stand Sit to Stand: Supervision           General transfer comment: stood in bathroom unaided, but had to assist with pulling up brief and for safety     Ambulation/Gait Ambulation/Gait assistance: Contact guard assist, Min assist Gait Distance (Feet): 160 Feet Assistive device: Rolling walker (2 wheels) Gait Pattern/deviations: Step-through pattern, Shuffle, Trunk flexed, Wide base of support, Decreased stride length       General Gait Details: min assist for turning walker safely, walker adjusted for height though pt tends to push further out and with flexed posture, assist throughout for fall prevention   Stairs             Wheelchair Mobility     Tilt Bed    Modified Rankin (Stroke Patients Only)       Balance Overall balance assessment: Needs assistance Sitting-balance support: No upper extremity supported Sitting balance-Leahy Scale: Good Sitting balance - Comments: in bathroom completing toilet hygiene unaided   Standing balance support: During functional activity, No upper extremity supported Standing balance-Leahy Scale: Poor Standing balance comment: washing hands at sink needing CGA for safety                            Cognition Arousal: Alert Behavior During Therapy: WFL for tasks assessed/performed Overall Cognitive Status: History of cognitive impairments - at baseline                                 General Comments: reports decreased STM        Exercises      General Comments General comments (skin integrity, edema, etc.): HR in 50's SpO2 100% after ambulation on RA  Pertinent Vitals/Pain Pain Assessment Pain Assessment: No/denies pain    Home Living                          Prior Function            PT Goals (current goals can now be found in the care plan section) Progress towards PT goals: Progressing toward goals    Frequency    Min 1X/week      PT Plan      Co-evaluation              AM-PAC PT "6 Clicks" Mobility   Outcome Measure  Help needed turning from your back to your side while in a flat bed without using  bedrails?: None Help needed moving from lying on your back to sitting on the side of a flat bed without using bedrails?: A Little Help needed moving to and from a bed to a chair (including a wheelchair)?: A Little Help needed standing up from a chair using your arms (e.g., wheelchair or bedside chair)?: A Little Help needed to walk in hospital room?: A Little Help needed climbing 3-5 steps with a railing? : Total 6 Click Score: 17    End of Session Equipment Utilized During Treatment: Gait belt Activity Tolerance: Patient tolerated treatment well Patient left: in chair;with call bell/phone within reach;with chair alarm set;with family/visitor present   PT Visit Diagnosis: Other abnormalities of gait and mobility (R26.89);History of falling (Z91.81)     Time: 1610-9604 PT Time Calculation (min) (ACUTE ONLY): 29 min  Charges:    $Gait Training: 8-22 mins $Therapeutic Activity: 8-22 mins PT General Charges $$ ACUTE PT VISIT: 1 Visit                     [name], PT Acute Rehabilitation Services [phone] 02/04/2023    [name] 02/04/2023, 5:07 PM

## 2023-02-05 DIAGNOSIS — W19XXXA Unspecified fall, initial encounter: Secondary | ICD-10-CM | POA: Diagnosis not present

## 2023-02-05 DIAGNOSIS — R296 Repeated falls: Secondary | ICD-10-CM | POA: Diagnosis not present

## 2023-02-05 DIAGNOSIS — I441 Atrioventricular block, second degree: Secondary | ICD-10-CM | POA: Diagnosis not present

## 2023-02-05 DIAGNOSIS — Y92009 Unspecified place in unspecified non-institutional (private) residence as the place of occurrence of the external cause: Secondary | ICD-10-CM | POA: Diagnosis not present

## 2023-02-05 DIAGNOSIS — R001 Bradycardia, unspecified: Secondary | ICD-10-CM | POA: Diagnosis not present

## 2023-02-05 MED ORDER — APIXABAN 5 MG PO TABS: 5.0000 mg | ORAL_TABLET | Freq: Two times a day (BID) | ORAL | Status: AC

## 2023-02-05 NOTE — Progress Notes (Signed)
Occupational Therapy Treatment Patient Details Name: [name] MRN: [id number] DOB: 1930/07/06 Today's Date: 02/05/2023   History of present illness The pt is a 89 yo female presenting 9/28 after 1-2 falls with reports of general weakness. Cardiology reports rhythm has been 2nd degree mobitz I since admission with little concern for cardiac cause of fall. PMH includes: sinus bradycardia with first-degree AV block, recurrent DVT on chronic Eliquis, HTN, HLD, RA.   OT comments  Patient sitting in bedside chair arrival into room and agreeable to participate in OT intervention.  Patient and daughter educated on EC/WS techniques and gradual progression of overall strength and activity tolerance. Patient trialed red theraband HEP but patient unable to get full ROM so bicep curls and chest presses completed against gravity only Patient would benefit from additional OT intervention to address functional deficits in order for patient to return to PLOF.                                                                                  If plan is discharge home, recommend the following:  A little help with walking and/or transfers;A little help with bathing/dressing/bathroom;Assistance with cooking/housework;Help with stairs or ramp for entrance   Equipment Recommendations  Other (comment)    Recommendations for Other Services      Precautions / Restrictions Precautions Precautions: Fall Precaution Comments: admitted after a fall, reports multiple other stumbles Restrictions Weight Bearing Restrictions: No       Mobility Bed Mobility Overal bed mobility: Needs Assistance (patient sitting upright in bedside chair upon arrival into room)                  Transfers                         Balance                                           ADL either performed or assessed with clinical judgement   ADL Overall ADL's : Needs  assistance/impaired Eating/Feeding: Independent                                          Extremity/Trunk Assessment Upper Extremity Assessment Upper Extremity Assessment: Generalized weakness (RA noted in BUE)            Vision       Perception     Praxis      Cognition Arousal: Alert Behavior During Therapy: WFL for tasks assessed/performed Overall Cognitive Status: History of cognitive impairments - at baseline                                 General Comments: reports decreased STM        Exercises Exercises: General Upper Extremity General Exercises - Upper Extremity Shoulder Flexion: AROM, 5 reps, Strengthening, Seated (trialed red therabnd, unable to get  rull ROM so  transitioned to against gravity for shoulder raies and bicep curls) Elbow Flexion: AROM, Seated Elbow Extension: AROM, Seated    Shoulder Instructions       General Comments      Pertinent Vitals/ Pain       Pain Assessment Pain Assessment: No/denies pain Faces Pain Scale: No hurt Pain Intervention(s): Monitored during session  Home Living                                          Prior Functioning/Environment              Frequency  Min 1X/week        Progress Toward Goals  OT Goals(current goals can now be found in the care plan section)  Progress towards OT goals: Progressing toward goals  Acute Rehab OT Goals OT Goal Formulation: With patient/family Time For Goal Achievement: 02/17/23 Potential to Achieve Goals: Good ADL Goals Pt Will Perform Lower Body Bathing: with modified independence;sit to/from stand;with adaptive equipment Pt Will Perform Lower Body Dressing: with modified independence;sit to/from stand;with adaptive equipment Pt Will Transfer to Toilet: with modified independence;ambulating  Plan      Co-evaluation                 AM-PAC OT "6 Clicks" Daily Activity     Outcome Measure   Help from  another person eating meals?: None Help from another person taking care of personal grooming?: A Little Help from another person toileting, which includes using toliet, bedpan, or urinal?: A Little Help from another person bathing (including washing, rinsing, drying)?: A Little Help from another person to put on and taking off regular upper body clothing?: A Little Help from another person to put on and taking off regular lower body clothing?: A Lot 6 Click Score: 18    End of Session    OT Visit Diagnosis: Other abnormalities of gait and mobility (R26.89);Unsteadiness on feet (R26.81)   Activity Tolerance Patient tolerated treatment well   Patient Left in chair;with call bell/phone within reach;with chair alarm set;with family/visitor present   Nurse Communication Mobility status        Time: 1324-4010 OT Time Calculation (min): 57 min  Charges: OT General Charges $OT Visit: 1 Visit OT Treatments $Self Care/Home Management : 53-67 mins  Governor Specking OT/L  Denice Paradise 02/05/2023, 2:42 PM

## 2023-02-05 NOTE — Progress Notes (Addendum)
Patient Name: [name] Date of Encounter: 02/05/2023 [facility] Cardiologist: Maisie Fus, MD   Interval Summary  .    Cardiology has been asked to see patient again given daughter's concerns for heart rates dropping in the 30s.  Patient still without complaints.  Vital Signs .    Vitals:   02/04/23 1950 02/05/23 0039 02/05/23 0413 02/05/23 0753  BP: 123/62 (!) 129/56 (!) 131/51 (!) 140/59  Pulse: (!) 58 (!) 49 (!) 48 (!) 59  Resp: 14 14 14 13   Temp: 97.9 F (36.6 C) 98.2 F (36.8 C) 98.5 F (36.9 C) 98.9 F (37.2 C)  TempSrc: Oral Oral Oral Oral  SpO2: 100% 99% 97% 98%  Weight:      Height:        Intake/Output Summary (Last 24 hours) at 02/05/2023 1207 Last data filed at 02/04/2023 2000 Gross per 24 hour  Intake 240 ml  Output --  Net 240 ml      02/04/2023    5:49 AM 02/03/2023    4:48 AM 02/02/2023    5:28 AM  Last 3 Weights  Weight (lbs) 134 lb 7.7 oz 130 lb 4.7 oz 128 lb 8.5 oz  Weight (kg) 61 kg 59.1 kg 58.3 kg      Telemetry/ECG    Type I second-degree AV block 40-50s most of the time. Short duration in the 30s - Personally Reviewed  CV Studies    Echocardiogram 02/02/2023 1. Left ventricular ejection fraction, by estimation, is 60 to 65%. The  left ventricle has normal function. The left ventricle has no regional  wall motion abnormalities. Left ventricular diastolic parameters are  indeterminate.   2. Right ventricular systolic function is normal. The right ventricular  size is mildly enlarged. There is moderately elevated pulmonary artery  systolic pressure. The estimated right ventricular systolic pressure is  49.0 mmHg.   3. Left atrial size was mildly dilated.   4. Right atrial size was moderately dilated.   5. The mitral valve is normal in structure. Mild mitral valve  regurgitation. No evidence of mitral stenosis.   6. Tricuspid valve regurgitation is moderate.   7. The aortic valve was not well visualized. Aortic valve  regurgitation  is trivial. Aortic valve sclerosis/calcification is present, without any  evidence of aortic stenosis.   8. The inferior vena cava is dilated in size with >50% respiratory  variability, suggesting right atrial pressure of 8 mmHg.   Physical Exam .   GEN: No acute distress.   Neck: No JVD Cardiac: RRR, no murmurs, rubs, or gallops.  Respiratory: Clear to auscultation bilaterally GI: Soft, nontender, non-distended  MS: 2+ edema  Patient Profile    [name] is a 89 y.o. female has hx of  recurrent deep vein thrombosis on Eliquis, hypertension, hypercholesteremia, and gastroesophageal reflux disease and admitted on 02/01/2023 for the evaluation of bradycardia and fall.   Assessment & Plan .    Second-degree type I heart block Felt not related to mechanical fall.  She denies any symptoms of dizziness, lightheadedness, syncope.  No indications for PPM.  At baseline has heart rates in the 50s with occasional drops in the 30s and 40s.  Discussed with daughter who had concerns. No indications for PPM currently.  Avoid rate lowering meds.   Pulmonary hypertension Mild MR/TR LVEF 60 to 65%.  Normal RV function.  Moderately elevated PASP 49. Dilated IVC. Has 1-2+ pitting edema with compression stockings.  Reports that this  is chronic for her and unchanged, but likely needs some diuresis. Will discuss with MD.   History of recurrent DVTs Continue Eliquis  Hyperlipidemia Continue statin  Follow-up has been arranged for 02/17/2023   For questions or updates, please contact [facility] Please consult [email or web address] for contact info under        Signed, Abagail Kitchens, PA-C

## 2023-02-05 NOTE — Progress Notes (Signed)
Patient report given to nurse Judeth Cornfield in [facility]. Elnita Maxwell, RN

## 2023-02-05 NOTE — Discharge Summary (Signed)
week(s).   Specialty: Internal Medicine Why: For hospital follow-up Contact information: [address] [place] [address] [phone]         Jonita Albee, PA-C Follow up on 02/17/2023.   Specialty:  Cardiology Why: 10:05 am Contact information: [address] [place] [address] [phone]              Contact information for after-discharge care     Destination     [facility] Preferred SNF .   Service: Skilled Nursing Contact information: [address] [place] [address] [phone]                    No Known Allergies  Consultations: Cardiology   Procedures/Studies: ECHOCARDIOGRAM COMPLETE  Result Date: 02/02/2023    ECHOCARDIOGRAM REPORT   Patient Name:   [name] Date of Exam: 02/02/2023 Medical Rec #:  [id number]     Height:       64.0 in Accession #:    [id number]    Weight:       128.5 lb Date of Birth:  [birth year]      BSA:          1.621 m Patient Age:    89 years      BP:           146/57 mmHg Patient Gender: F             HR:           44 bpm. Exam Location:  Inpatient Procedure: 2D Echo, Cardiac Doppler and Color Doppler Indications:    Heart block, 2nd degree I44.1  History:        Patient has no prior history of Echocardiogram examinations.                 Arrythmias:Bradycardia; Risk Factors:Hypertension, Dyslipidemia                 and Sleep Apnea.  Sonographer:    Lucendia Herrlich Referring Phys: [id number] VISHAL R PATEL IMPRESSIONS  1. Left ventricular ejection fraction, by estimation, is 60 to 65%. The left ventricle has normal function. The left ventricle has no regional wall motion abnormalities. Left ventricular diastolic parameters are indeterminate.  2. Right ventricular systolic function is normal. The right ventricular size is mildly enlarged. There is moderately elevated pulmonary artery systolic pressure. The estimated right ventricular systolic pressure is 49.0 mmHg.  3. Left atrial size was mildly dilated.  4. Right atrial size was moderately dilated.  5. The mitral valve is normal in structure. Mild mitral valve regurgitation. No evidence of mitral stenosis.  6.  Tricuspid valve regurgitation is moderate.  7. The aortic valve was not well visualized. Aortic valve regurgitation is trivial. Aortic valve sclerosis/calcification is present, without any evidence of aortic stenosis.  8. The inferior vena cava is dilated in size with >50% respiratory variability, suggesting right atrial pressure of 8 mmHg. FINDINGS  Left Ventricle: Left ventricular ejection fraction, by estimation, is 60 to 65%. The left ventricle has normal function. The left ventricle has no regional wall motion abnormalities. The left ventricular internal cavity size was normal in size. There is  no left ventricular hypertrophy. Left ventricular diastolic parameters are indeterminate. Right Ventricle: The right ventricular size is mildly enlarged. Right vetricular wall thickness was not well visualized. Right ventricular systolic function is normal. There is moderately elevated pulmonary artery systolic pressure. The tricuspid  Physician Discharge Summary  [name] ZOX:[id number] DOB: 03/24/1931 DOA: 02/01/2023  PCP: Andi Devon, MD  Admit date: 02/01/2023 Discharge date: 02/05/2023  Admitted From: Home Disposition: SNF  Recommendations for Outpatient Follow-up:  Follow up with SNF provider at earliest convenience Outpatient follow-up with cardiology Follow up in ED if symptoms worsen or new appear   Home Health: No Equipment/Devices: None  Discharge Condition: Stable CODE STATUS: DNR  diet recommendation: Heart healthy  Brief/Interim Summary: 89 y.o. female with a history of sinus bradycardia with first-degree AV block, recurrent DVT on Eliquis, hypertension, hyperlipidemia, rheumatoid arthritis presented with fall after getting out of bed with no loss of consciousness but some lightheadedness prior to falling. Cardiology was consulted with recommendation for no further workup or management. Transthoracic Echocardiogram significant for normal LVEF of 60-65% with evidence of elevated pulmonary artery pressures, enlarged right ventricle and moderate tricuspid regurgitation.  PT recommended SNF placement.  She will be discharged to SNF once bed is available.  Discharge Diagnoses:   Bradycardia Type I second-degree AV block Moderate tricuspid regurgitation -Cardiology recommended no further workup. -Echo showed EF of 60 to 65% with evidence of elevated pulmonary artery pressures and moderate TR -Heart rate dropping down to the 30s.  Cardiology reevaluated the patient today and recommended conservative management and cleared the patient for discharge.  Discharge patient to SNF once bed is available.  pulmonary artery hypertension -Outpatient cardiology follow-up   Hypokalemia -Resolved   Hyperkalemia -Resolved   Hypomagnesemia -Resolved   Fall -Initial concern that this could be cardiac related.  Per cardiology, unlikely related to bradyarrhythmia -PT/OT recommending SNF placement.  TOC  following.   AKI -Unknown baseline.  Last creatinine of 1.58 from 2021.  Creatinine of 1.29 on admission.  Improved to 1.17 on 02/02/2023.   Essential hypertension -Continue amlodipine, olmesartan and hydrochlorothiazide  Hyperlipidemia Continue statin   History of DVT -continue Eliquis   Anemia of chronic disease -From chronic illnesses.  Monitor intermittently as an outpatient.  Hemoglobin stable currently.  Discharge Instructions  Discharge Instructions     Ambulatory referral to Cardiology   Complete by: As directed    Diet - low sodium heart healthy   Complete by: As directed    Increase activity slowly   Complete by: As directed       Allergies as of 02/05/2023   No Known Allergies      Medication List     STOP taking these medications    omeprazole 20 MG capsule Commonly known as: PRILOSEC       TAKE these medications    amLODipine 2.5 MG tablet Commonly known as: NORVASC Take 1 tablet by mouth daily.   apixaban 5 MG Tabs tablet Commonly known as: ELIQUIS Take 1 tablet (5 mg total) by mouth 2 (two) times daily.   Folivane-Plus Caps Take 1 capsule by mouth daily.   olmesartan-hydrochlorothiazide 20-12.5 MG tablet Commonly known as: BENICAR HCT Take 1 tablet by mouth daily.   OVER THE COUNTER MEDICATION Take 1 tablet by mouth daily. D3 k2   OVER THE COUNTER MEDICATION Take 1 tablet by mouth daily. Omega xl   PRESERVISION AREDS 2+MULTI VIT PO Take 1 tablet by mouth daily.   simvastatin 40 MG tablet Commonly known as: ZOCOR Take 40 mg by mouth daily at 6 PM.        Contact information for follow-up providers     Andi Devon, MD. Schedule an appointment as soon as possible for a visit in 1  week(s).   Specialty: Internal Medicine Why: For hospital follow-up Contact information: [address] [place] [address] [phone]         Jonita Albee, PA-C Follow up on 02/17/2023.   Specialty:  Cardiology Why: 10:05 am Contact information: [address] [place] [place] [address] [phone]              Contact information for after-discharge care     Destination     [facility] Preferred SNF .   Service: Skilled Nursing Contact information: [address] [place] [address] [phone]                    No Known Allergies  Consultations: Cardiology   Procedures/Studies: ECHOCARDIOGRAM COMPLETE  Result Date: 02/02/2023    ECHOCARDIOGRAM REPORT   Patient Name:   [name] Date of Exam: 02/02/2023 Medical Rec #:  [id number]     Height:       64.0 in Accession #:    [id number]    Weight:       128.5 lb Date of Birth:  [birth year]      BSA:          1.621 m Patient Age:    89 years      BP:           146/57 mmHg Patient Gender: F             HR:           44 bpm. Exam Location:  Inpatient Procedure: 2D Echo, Cardiac Doppler and Color Doppler Indications:    Heart block, 2nd degree I44.1  History:        Patient has no prior history of Echocardiogram examinations.                 Arrythmias:Bradycardia; Risk Factors:Hypertension, Dyslipidemia                 and Sleep Apnea.  Sonographer:    Lucendia Herrlich Referring Phys: [id number] VISHAL R PATEL IMPRESSIONS  1. Left ventricular ejection fraction, by estimation, is 60 to 65%. The left ventricle has normal function. The left ventricle has no regional wall motion abnormalities. Left ventricular diastolic parameters are indeterminate.  2. Right ventricular systolic function is normal. The right ventricular size is mildly enlarged. There is moderately elevated pulmonary artery systolic pressure. The estimated right ventricular systolic pressure is 49.0 mmHg.  3. Left atrial size was mildly dilated.  4. Right atrial size was moderately dilated.  5. The mitral valve is normal in structure. Mild mitral valve regurgitation. No evidence of mitral stenosis.  6.  Tricuspid valve regurgitation is moderate.  7. The aortic valve was not well visualized. Aortic valve regurgitation is trivial. Aortic valve sclerosis/calcification is present, without any evidence of aortic stenosis.  8. The inferior vena cava is dilated in size with >50% respiratory variability, suggesting right atrial pressure of 8 mmHg. FINDINGS  Left Ventricle: Left ventricular ejection fraction, by estimation, is 60 to 65%. The left ventricle has normal function. The left ventricle has no regional wall motion abnormalities. The left ventricular internal cavity size was normal in size. There is  no left ventricular hypertrophy. Left ventricular diastolic parameters are indeterminate. Right Ventricle: The right ventricular size is mildly enlarged. Right vetricular wall thickness was not well visualized. Right ventricular systolic function is normal. There is moderately elevated pulmonary artery systolic pressure. The tricuspid  week(s).   Specialty: Internal Medicine Why: For hospital follow-up Contact information: [address] [place] [address] [phone]         Jonita Albee, PA-C Follow up on 02/17/2023.   Specialty:  Cardiology Why: 10:05 am Contact information: [address] [place] [place] [address] [phone]              Contact information for after-discharge care     Destination     [facility] Preferred SNF .   Service: Skilled Nursing Contact information: [address] [place] [address] [phone]                    No Known Allergies  Consultations: Cardiology   Procedures/Studies: ECHOCARDIOGRAM COMPLETE  Result Date: 02/02/2023    ECHOCARDIOGRAM REPORT   Patient Name:   [name] Date of Exam: 02/02/2023 Medical Rec #:  [id number]     Height:       64.0 in Accession #:    [id number]    Weight:       128.5 lb Date of Birth:  [birth year]      BSA:          1.621 m Patient Age:    89 years      BP:           146/57 mmHg Patient Gender: F             HR:           44 bpm. Exam Location:  Inpatient Procedure: 2D Echo, Cardiac Doppler and Color Doppler Indications:    Heart block, 2nd degree I44.1  History:        Patient has no prior history of Echocardiogram examinations.                 Arrythmias:Bradycardia; Risk Factors:Hypertension, Dyslipidemia                 and Sleep Apnea.  Sonographer:    Lucendia Herrlich Referring Phys: [id number] VISHAL R PATEL IMPRESSIONS  1. Left ventricular ejection fraction, by estimation, is 60 to 65%. The left ventricle has normal function. The left ventricle has no regional wall motion abnormalities. Left ventricular diastolic parameters are indeterminate.  2. Right ventricular systolic function is normal. The right ventricular size is mildly enlarged. There is moderately elevated pulmonary artery systolic pressure. The estimated right ventricular systolic pressure is 49.0 mmHg.  3. Left atrial size was mildly dilated.  4. Right atrial size was moderately dilated.  5. The mitral valve is normal in structure. Mild mitral valve regurgitation. No evidence of mitral stenosis.  6.  Tricuspid valve regurgitation is moderate.  7. The aortic valve was not well visualized. Aortic valve regurgitation is trivial. Aortic valve sclerosis/calcification is present, without any evidence of aortic stenosis.  8. The inferior vena cava is dilated in size with >50% respiratory variability, suggesting right atrial pressure of 8 mmHg. FINDINGS  Left Ventricle: Left ventricular ejection fraction, by estimation, is 60 to 65%. The left ventricle has normal function. The left ventricle has no regional wall motion abnormalities. The left ventricular internal cavity size was normal in size. There is  no left ventricular hypertrophy. Left ventricular diastolic parameters are indeterminate. Right Ventricle: The right ventricular size is mildly enlarged. Right vetricular wall thickness was not well visualized. Right ventricular systolic function is normal. There is moderately elevated pulmonary artery systolic pressure. The tricuspid  week(s).   Specialty: Internal Medicine Why: For hospital follow-up Contact information: [address] [place] [address] [phone]         Jonita Albee, PA-C Follow up on 02/17/2023.   Specialty:  Cardiology Why: 10:05 am Contact information: [address] [place] [place] [address] [phone]              Contact information for after-discharge care     Destination     [facility] Preferred SNF .   Service: Skilled Nursing Contact information: [address] [place] [address] [phone]                    No Known Allergies  Consultations: Cardiology   Procedures/Studies: ECHOCARDIOGRAM COMPLETE  Result Date: 02/02/2023    ECHOCARDIOGRAM REPORT   Patient Name:   [name] Date of Exam: 02/02/2023 Medical Rec #:  [id number]     Height:       64.0 in Accession #:    [id number]    Weight:       128.5 lb Date of Birth:  [birth year]      BSA:          1.621 m Patient Age:    89 years      BP:           146/57 mmHg Patient Gender: F             HR:           44 bpm. Exam Location:  Inpatient Procedure: 2D Echo, Cardiac Doppler and Color Doppler Indications:    Heart block, 2nd degree I44.1  History:        Patient has no prior history of Echocardiogram examinations.                 Arrythmias:Bradycardia; Risk Factors:Hypertension, Dyslipidemia                 and Sleep Apnea.  Sonographer:    Lucendia Herrlich Referring Phys: [id number] VISHAL R PATEL IMPRESSIONS  1. Left ventricular ejection fraction, by estimation, is 60 to 65%. The left ventricle has normal function. The left ventricle has no regional wall motion abnormalities. Left ventricular diastolic parameters are indeterminate.  2. Right ventricular systolic function is normal. The right ventricular size is mildly enlarged. There is moderately elevated pulmonary artery systolic pressure. The estimated right ventricular systolic pressure is 49.0 mmHg.  3. Left atrial size was mildly dilated.  4. Right atrial size was moderately dilated.  5. The mitral valve is normal in structure. Mild mitral valve regurgitation. No evidence of mitral stenosis.  6.  Tricuspid valve regurgitation is moderate.  7. The aortic valve was not well visualized. Aortic valve regurgitation is trivial. Aortic valve sclerosis/calcification is present, without any evidence of aortic stenosis.  8. The inferior vena cava is dilated in size with >50% respiratory variability, suggesting right atrial pressure of 8 mmHg. FINDINGS  Left Ventricle: Left ventricular ejection fraction, by estimation, is 60 to 65%. The left ventricle has normal function. The left ventricle has no regional wall motion abnormalities. The left ventricular internal cavity size was normal in size. There is  no left ventricular hypertrophy. Left ventricular diastolic parameters are indeterminate. Right Ventricle: The right ventricular size is mildly enlarged. Right vetricular wall thickness was not well visualized. Right ventricular systolic function is normal. There is moderately elevated pulmonary artery systolic pressure. The tricuspid

## 2023-02-05 NOTE — Progress Notes (Signed)
PROGRESS NOTE    [name]  ZOX:[id number] DOB: [birth year] DOA: 02/01/2023 PCP: Andi Devon, MD   Brief Narrative:  89 y.o. female with a history of sinus bradycardia with first-degree AV block, recurrent DVT on Eliquis, hypertension, hyperlipidemia, rheumatoid arthritis presented with fall after getting out of bed with no loss of consciousness but some lightheadedness prior to falling. Cardiology was consulted with recommendation for no further workup or management. Transthoracic Echocardiogram significant for normal LVEF of 60-65% with evidence of elevated pulmonary artery pressures, enlarged right ventricle and moderate tricuspid regurgitation.  PT recommended SNF placement.  Assessment & Plan:   Bradycardia Type I second-degree AV block Moderate tricuspid regurgitation -Cardiology recommended no further workup. -Echo showed EF of 60 to 65% with evidence of elevated pulmonary artery pressures and moderate TR -Continue telemetry monitoring. -Daughter is concerned that the heart rate is dropping down to 30s.  Will ask cardiology to reevaluate.  Pulmonary artery hypertension -Outpatient cardiology follow-up  Hypokalemia -Resolved  Hyperkalemia -Resolved  Hypomagnesemia -Resolved  Fall -Initial concern that this could be cardiac related.  Per cardiology, unlikely related to bradyarrhythmia -PT/OT recommending SNF placement.  TOC following.  AKI -Unknown baseline.  Last creatinine of 1.58 from 2021.  Creatinine of 1.29 on admission.  Improved to 1.17 on 02/02/2023.  Essential hypertension -Continue amlodipine, irbesartan and hydrochlorothiazide.  Hyperlipidemia Continue statin  History of DVT -continue Eliquis  Anemia of chronic disease -From chronic illnesses.  Monitor intermittently.  Hemoglobin stable currently   DVT prophylaxis: Eliquis Code Status: DNR Family Communication: None at bedside Disposition Plan: Status is: Observation The patient will  require care spanning > 2 midnights and should be moved to inpatient because: Of need for SNF placement.  Consultants: Cardiology  Procedures: 2D echo  Antimicrobials: None   Subjective: Patient seen and examined at bedside.  Elderly female sitting in bed.  Awake, answers some questions.  No fever, chest pain or vomiting reported.  Objective: Vitals:   02/04/23 1950 02/05/23 0039 02/05/23 0413 02/05/23 0753  BP: 123/62 (!) 129/56 (!) 131/51 (!) 140/59  Pulse: (!) 58 (!) 49 (!) 48 (!) 59  Resp: 14 14 14 13   Temp: 97.9 F (36.6 C) 98.2 F (36.8 C) 98.5 F (36.9 C) 98.9 F (37.2 C)  TempSrc: Oral Oral Oral Oral  SpO2: 100% 99% 97% 98%  Weight:      Height:        Intake/Output Summary (Last 24 hours) at 02/05/2023 1048 Last data filed at 02/04/2023 2000 Gross per 24 hour  Intake 240 ml  Output --  Net 240 ml   Filed Weights   02/02/23 0528 02/03/23 0448 02/04/23 0549  Weight: 58.3 kg 59.1 kg 61 kg    Examination:  General exam: Appears calm and comfortable.  Elderly female lying in bed.  On room air.  No distress.  Slow to respond. Respiratory system: Bilateral decreased breath sounds at bases with some scattered crackles Cardiovascular system: S1 & S2 heard, intermittently bradycardic Gastrointestinal system: Abdomen is nondistended, soft and nontender. Normal bowel sounds heard. Extremities: No cyanosis, clubbing; trace lower extremity edema present  Data Reviewed: I have personally reviewed following labs and imaging studies  CBC: Recent Labs  Lab 02/01/23 1735 02/02/23 0243 02/03/23 0325  WBC 6.7 5.9 6.4  HGB 10.4* 9.4* 9.6*  HCT 32.3* 28.5* 29.0*  MCV 93.4 91.6 91.8  PLT 213 184 200   Basic Metabolic Panel: Recent Labs  Lab 02/01/23 1735 02/01/23 2328 02/02/23 0243 02/04/23 1148  02/04/23 1546  NA 141  --  140  --   --   K 3.3*  --  4.2 5.4* 4.0  CL 106  --  110  --   --   CO2 25  --  24  --   --   GLUCOSE 85  --  94  --   --   BUN 27*  --   24*  --   --   CREATININE 1.29*  --  1.17*  --   --   CALCIUM 9.0  --  8.7*  --   --   MG  --  1.7 1.6* 1.8  --    GFR: Estimated Creatinine Clearance: 27.6 mL/min (A) (by C-G formula based on SCr of 1.17 mg/dL (H)). Liver Function Tests: No recent labs.  Outpatient follow-up   No results for input(s): "AST", "ALT", "ALKPHOS", "BILITOT", "PROT", "ALBUMIN" in the last 168 hours. No results for input(s): "LIPASE", "AMYLASE" in the last 168 hours. No results for input(s): "AMMONIA" in the last 168 hours. Coagulation Profile: No results for input(s): "INR", "PROTIME" in the last 168 hours. Cardiac Enzymes: No results for input(s): "CKTOTAL", "CKMB", "CKMBINDEX", "TROPONINI" in the last 168 hours. BNP (last 3 results) No results for input(s): "PROBNP" in the last 8760 hours. HbA1C: No results for input(s): "HGBA1C" in the last 72 hours. CBG: Recent Labs  Lab 02/01/23 1740  GLUCAP 72   Lipid Profile: No results for input(s): "CHOL", "HDL", "LDLCALC", "TRIG", "CHOLHDL", "LDLDIRECT" in the last 72 hours. Thyroid Function Tests: No results for input(s): "TSH", "T4TOTAL", "FREET4", "T3FREE", "THYROIDAB" in the last 72 hours. Anemia Panel: No results for input(s): "VITAMINB12", "FOLATE", "FERRITIN", "TIBC", "IRON", "RETICCTPCT" in the last 72 hours. Sepsis Labs: No results for input(s): "PROCALCITON", "LATICACIDVEN" in the last 168 hours.  No results found for this or any previous visit (from the past 240 hour(s)).       Radiology Studies: No results found.      Scheduled Meds:  amLODipine  2.5 mg Oral Daily   apixaban  5 mg Oral BID   irbesartan  150 mg Oral Daily   And   hydrochlorothiazide  12.5 mg Oral Daily   simvastatin  20 mg Oral q1800   sodium chloride flush  3 mL Intravenous Q12H   Continuous Infusions:        Glade Lloyd, MD [facility] 02/05/2023, 10:48 AM

## 2023-02-05 NOTE — TOC Progression Note (Signed)
Transition of Care [facility]) - Progression Note    Patient Details  Name: [name] MRN: [id number] Date of Birth: [birth year]  Transition of Care [facility]) CM/SW Contact  Dellie Burns [place], [place] Phone Number: 02/05/2023, 8:49 AM  Clinical Narrative:  pt's insurance policy has populated in the [facility] portal as of this morning. CMA has started [place] for [employer]. Dtr updated.   Dellie Burns, MSW, LCSW [phone] (coverage)       Expected Discharge Plan: Skilled Nursing Facility Barriers to Discharge: Insurance Authorization  Expected Discharge Plan and Services     Post Acute Care Choice: Skilled Nursing Facility Living arrangements for the past 2 months: Single Family Home                                       Social Determinants of Health (SDOH) Interventions SDOH Screenings   Food Insecurity: No Food Insecurity (02/01/2023)  Housing: Low Risk  (02/01/2023)  Transportation Needs: No Transportation Needs (02/01/2023)  Utilities: Not At Risk (02/01/2023)  Tobacco Use: Low Risk  (02/01/2023)    Readmission Risk Interventions     No data to display

## 2023-02-05 NOTE — TOC Transition Note (Signed)
Transition of Care [facility]) - CM/SW Discharge Note   Patient Details  Name: [name] MRN: [id number] Date of Birth: 01/06/1931  Transition of Care [facility]) CM/SW Contact:  Deatra Robinson, LCSW Phone Number: 02/05/2023, 3:02 PM   Clinical Narrative:   pt for dc to [facility] today. UHC auth details (10/2 - 10/4 Vesta Mixer ID [id number]) provided to [place] at [facility] who confirmed they are prepared to admit pt to room 1102B. Pt's dtr at bedside and agreeable to dc plan. RN provided with number for report and PTAR arranged for transport. SW signing off at dc.   Dellie Burns, MSW, LCSW [phone] (coverage)      Final next level of care: Skilled Nursing Facility Barriers to Discharge: No Barriers Identified   Patient Goals and CMS Choice CMS Medicare.gov Compare Post Acute Care list provided to:: Patient Choice offered to / list presented to : Adult Children  Discharge Placement                Patient chooses bed at: [facility] Patient to be transferred to facility by: PTAR Name of family member notified: Genifer/dtr Patient and family notified of of transfer: 02/05/23  Discharge Plan and Services Additional resources added to the After Visit Summary for       Post Acute Care Choice: Skilled Nursing Facility                               Social Determinants of Health (SDOH) Interventions SDOH Screenings   Food Insecurity: No Food Insecurity (02/01/2023)  Housing: Low Risk  (02/01/2023)  Transportation Needs: No Transportation Needs (02/01/2023)  Utilities: Not At Risk (02/01/2023)  Tobacco Use: Low Risk  (02/01/2023)     Readmission Risk Interventions     No data to display

## 2023-02-17 ENCOUNTER — Encounter: Payer: Self-pay | Admitting: Cardiology

## 2023-02-17 ENCOUNTER — Ambulatory Visit: Payer: 59 | Attending: Cardiology | Admitting: Cardiology

## 2023-02-17 VITALS — BP 102/58 | HR 60 | Ht 64.0 in | Wt 134.4 lb

## 2023-02-17 DIAGNOSIS — R001 Bradycardia, unspecified: Secondary | ICD-10-CM

## 2023-02-17 DIAGNOSIS — I340 Nonrheumatic mitral (valve) insufficiency: Secondary | ICD-10-CM

## 2023-02-17 DIAGNOSIS — Z86718 Personal history of other venous thrombosis and embolism: Secondary | ICD-10-CM

## 2023-02-17 DIAGNOSIS — I071 Rheumatic tricuspid insufficiency: Secondary | ICD-10-CM | POA: Diagnosis not present

## 2023-02-17 DIAGNOSIS — I441 Atrioventricular block, second degree: Secondary | ICD-10-CM

## 2023-02-17 DIAGNOSIS — I10 Essential (primary) hypertension: Secondary | ICD-10-CM

## 2023-02-17 NOTE — Patient Instructions (Signed)
Medication Instructions:  No Changes *If you need a refill on your cardiac medications before your next appointment, please call your pharmacy*   Lab Work: Today BMP, CBC, and Mag If you have labs (blood work) drawn today and your tests are completely normal, you will receive your results only by: MyChart Message (if you have MyChart) OR A paper copy in the mail If you have any lab test that is abnormal or we need to change your treatment, we will call you to review the results.   Testing/Procedures: No Testing   Follow-Up: At [facility], you and your health needs are our priority.  As part of our continuing mission to provide you with exceptional heart care, we have created designated Provider Care Teams.  These Care Teams include your primary Cardiologist (physician) and Advanced Practice Providers (APPs -  Physician Assistants and Nurse Practitioners) who all work together to provide you with the care you need, when you need it.  We recommend signing up for the patient portal called "MyChart".  Sign up information is provided on this After Visit Summary.  MyChart is used to connect with patients for Virtual Visits (Telemedicine).  Patients are able to view lab/test results, encounter notes, upcoming appointments, etc.  Non-urgent messages can be sent to your provider as well.   To learn more about what you can do with MyChart, go to [email or web address].    Your next appointment:   6 month(s)  Provider:   Maisie Fus, MD

## 2023-02-18 ENCOUNTER — Telehealth: Payer: Self-pay

## 2023-02-18 LAB — CBC
Hematocrit: 32.5 % — ABNORMAL LOW (ref 34.0–46.6)
Hemoglobin: 10.4 g/dL — ABNORMAL LOW (ref 11.1–15.9)
MCH: 30.8 pg (ref 26.6–33.0)
MCHC: 32 g/dL (ref 31.5–35.7)
MCV: 96 fL (ref 79–97)
Platelets: 250 10*3/uL (ref 150–450)
RBC: 3.38 x10E6/uL — ABNORMAL LOW (ref 3.77–5.28)
RDW: 13 % (ref 11.7–15.4)
WBC: 9.2 10*3/uL (ref 3.4–10.8)

## 2023-02-18 LAB — BASIC METABOLIC PANEL
BUN/Creatinine Ratio: 21 (ref 12–28)
BUN: 29 mg/dL (ref 10–36)
CO2: 22 mmol/L (ref 20–29)
Calcium: 9 mg/dL (ref 8.7–10.3)
Chloride: 106 mmol/L (ref 96–106)
Creatinine, Ser: 1.35 mg/dL — ABNORMAL HIGH (ref 0.57–1.00)
Glucose: 114 mg/dL — ABNORMAL HIGH (ref 70–99)
Potassium: 3.9 mmol/L (ref 3.5–5.2)
Sodium: 141 mmol/L (ref 134–144)
eGFR: 37 mL/min/{1.73_m2} — ABNORMAL LOW (ref >59)

## 2023-02-18 LAB — MAGNESIUM: Magnesium: 1.8 mg/dL (ref 1.6–2.3)

## 2023-02-18 NOTE — Telephone Encounter (Signed)
Follow Up:     Patient is returning a call from today.

## 2023-02-18 NOTE — Telephone Encounter (Signed)
Please tell patient that her labwork showed her creatinine is a bit elevated to 1.35. This is a little higher than her baseline. Should return for repeat BMP in 2 weeks to make sure renal function is stable. Otherwise, potassium, magnesium were normal. Hemoglobin stable, WBC normal.   Thanks  KJ    Called patient home phone and mobile number to give patient blood results, left voice mail on both, will call her back tomorrow

## 2023-02-18 NOTE — Telephone Encounter (Signed)
Called Patient's daughter (approved by chart) gave them the results of the blood work and called [facility] they will draw the blood at there facility. Will keep our eyes out for that.

## 2023-03-04 LAB — LAB REPORT - SCANNED: EGFR: 35

## 2023-03-05 ENCOUNTER — Encounter: Payer: Self-pay | Admitting: Emergency Medicine

## 2023-07-04 ENCOUNTER — Encounter: Payer: Self-pay | Admitting: Physician Assistant

## 2023-07-04 ENCOUNTER — Other Ambulatory Visit (INDEPENDENT_AMBULATORY_CARE_PROVIDER_SITE_OTHER): Payer: Self-pay

## 2023-07-04 ENCOUNTER — Ambulatory Visit: Payer: Medicare PPO | Admitting: Physician Assistant

## 2023-07-04 DIAGNOSIS — M25562 Pain in left knee: Secondary | ICD-10-CM

## 2023-07-04 DIAGNOSIS — G8929 Other chronic pain: Secondary | ICD-10-CM

## 2023-07-04 DIAGNOSIS — M25561 Pain in right knee: Secondary | ICD-10-CM

## 2023-07-04 MED ORDER — METHYLPREDNISOLONE ACETATE 40 MG/ML IJ SUSP
40.0000 mg | INTRAMUSCULAR | Status: AC | PRN
Start: 2023-07-04 — End: 2023-07-04
  Administered 2023-07-04: 40 mg via INTRA_ARTICULAR

## 2023-07-04 MED ORDER — LIDOCAINE HCL 1 % IJ SOLN
3.0000 mL | INTRAMUSCULAR | Status: AC | PRN
Start: 2023-07-04 — End: 2023-07-04
  Administered 2023-07-04: 3 mL

## 2023-07-04 NOTE — Progress Notes (Signed)
 Office Visit Note   Patient: [name]           Date of Birth: 1930/08/22           MRN: [id number] Visit Date: 07/04/2023              Requested by: Andi Devon, MD [address] [place],  [place] [address] PCP: Andi Devon, MD   Assessment & Plan: Visit Diagnoses:  1. Chronic pain of both knees     Plan: Patient is a pleasant 89 year old woman who is accompanied by her daughter today.  She lives at an assisted living.  She comes in today with a history of bilateral knee pain.  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  She also has a diagnosed history of rheumatoid arthritis though has not seen anybody in years.  Her daughter has been in the [employer] and away to has not been aware why she has not seen anyone of late.  She has deformities of her hands are not painful.  Of late she has had a flare of bilateral knee pain and went from walking with a cane or a walker to only being able to stand for a minute or 2.  Steroid injections helped her but only last for short period of time.  We did go forward and inject her knees today.  Gel injections were not useful to her.  I am wondering if rheumatology might have a way to make her more comfortable through low-dose steroid orally or other modalities.  Will refer her to rheumatology.  Unfortunately because of her kidney status and she is on Eliquis she is not a candidate for anti-inflammatories.  Follow-Up Instructions: No follow-ups on file.   Orders:  No orders of the defined types were placed in this encounter.  No orders of the defined types were placed in this encounter.     Procedures: Large Joint Inj: bilateral knee on 07/04/2023 11:16 AM Indications: pain and diagnostic evaluation Details: 25 G 1.5 in needle, anteromedial approach  Arthrogram: No  Medications (Right): 3 mL lidocaine 1 %; 40 mg methylPREDNISolone acetate 40 MG/ML Medications (Left): 3 mL lidocaine 1 %; 40 mg  methylPREDNISolone acetate 40 MG/ML Outcome: tolerated well, no immediate complications Procedure, treatment alternatives, risks and benefits explained, specific risks discussed. Consent was given by the patient.       Clinical Data: No additional findings.   Subjective: No chief complaint on file.   HPI patient is a pleasant 89 year old woman who has a chief complaint of bilateral knee pain.  She has gone to flex and Genex for quite some time.  Also saw [facility] getting cortisone injections for years the injections only lasted about a week or more last injections were in November she also has had viscosupplementation but states that steroid injections last longer  Review of Systems  All other systems reviewed and are negative.    Objective: Vital Signs: There were no vitals taken for this visit.  Physical Exam Constitutional:      Appearance: Normal appearance.  Pulmonary:     Effort: Pulmonary effort is normal.  Skin:    General: Skin is warm and dry.  Neurological:     General: No focal deficit present.     Mental Status: She is alert and oriented to person, place, and time.  Psychiatric:        Mood and Affect: Mood normal.  Behavior: Behavior normal.     Ortho Exam Bilateral knees she has no redness mild effusion compartments are soft and compressible she has valgus alignment.  Negative Homans' sign no cellulitis or signs of infection Specialty Comments:  No specialty comments available.  Imaging: No results found.   PMFS History: Patient Active Problem List   Diagnosis Date Noted   Bilateral knee pain 07/04/2023   Fall at home, initial encounter 02/01/2023   2nd degree AV block 02/01/2023   Bradycardia 02/01/2023   Hypokalemia 02/01/2023   Renal insufficiency 02/01/2023   History of DVT (deep vein thrombosis) 02/01/2023   Unsteady gait 05/31/2019   Rheumatoid arthritis (HCC) 05/31/2019   Hyperlipidemia 05/31/2019   Essential  hypertension 05/31/2019   Sleep apnea    DVT (deep venous thrombosis) (HCC) 11/01/2015   Acute DVT (deep venous thrombosis) (HCC) 11/01/2015   Past Medical History:  Diagnosis Date   Blood clot in vein    GERD (gastroesophageal reflux disease)    H/O echocardiogram 09/05/2009   EF >55%   High cholesterol    Hypertension    Sleep apnea     Family History  Problem Relation Age of Onset   Diabetes Sister    Colon cancer Neg Hx    Esophageal cancer Neg Hx    Rectal cancer Neg Hx    Stomach cancer Neg Hx     Past Surgical History:  Procedure Laterality Date   ABDOMINAL HYSTERECTOMY     CARDIAC CATHETERIZATION  01/27/2003   NM MYOVIEW LTD  8/804   RULE OUT ISCHEMIA   TUBAL LIGATION     Social History   Occupational History   Occupation: retired  Tobacco Use   Smoking status: Never   Smokeless tobacco: Never  Vaping Use   Vaping status: Never Used  Substance and Sexual Activity   Alcohol use: No   Drug use: No   Sexual activity: Never

## 2023-07-23 ENCOUNTER — Ambulatory Visit: Payer: Medicare PPO | Admitting: Pulmonary Disease

## 2023-07-23 ENCOUNTER — Ambulatory Visit

## 2023-07-23 ENCOUNTER — Encounter: Payer: Self-pay | Admitting: Pulmonary Disease

## 2023-07-23 VITALS — BP 180/75 | HR 52 | Temp 97.9°F | Ht 64.0 in | Wt 178.0 lb

## 2023-07-23 DIAGNOSIS — R0609 Other forms of dyspnea: Secondary | ICD-10-CM | POA: Diagnosis not present

## 2023-07-23 DIAGNOSIS — Z8701 Personal history of pneumonia (recurrent): Secondary | ICD-10-CM

## 2023-07-23 MED ORDER — IPRATROPIUM-ALBUTEROL 0.5-2.5 (3) MG/3ML IN SOLN: 3.0000 mL | Freq: Two times a day (BID) | RESPIRATORY_TRACT | 11 refills | Status: AC

## 2023-07-23 NOTE — Progress Notes (Signed)
 @Patient  ID: [name], female    DOB: [birth year], 89 y.o.   MRN: [id number]  Chief Complaint  Patient presents with   Follow-up    Recurrent pneumonia, wheezing, s.o.b    Referring provider: Andi Devon, MD  HPI:   89 y.o. woman whom are seen for evaluation of reported recurrent pneumonia and wheezing and shortness of breath.  Note from referring provider reviewed.  Facility notes reviewed.   Patient reports shortness of breath, wheezing.  Reportedly had bouts of recurrent pneumonia over the last couple weeks.  Based on chest x-ray.  Curiously she had no fever or chills.  No cough.  Shortness of breath, sounds like infiltrate on chest x-ray.  Shortness of breath occurs with dressing etc.  She is no longer ambulatory due to significant arthritis.  Usually does not occur at rest.  Usually with some activity such as dressing as discussed above.  She is been prescribed DuoNebs to use as needed.  She uses less frequently than once a day.  Maybe a couple times a week sounds like.  She does feel like it helps when she uses it per her report.  Daughter is not so sure based on her observation of symptoms.  She is a never smoker.  Prior chest x-rays in the past reviewed clear lungs with evidence of hiatal hernia.  Questionaires / Pulmonary Flowsheets:   ACT:      No data to display          MMRC:     No data to display          Epworth:      No data to display          Tests:   FENO:  No results found for: "NITRICOXIDE"  PFT:     No data to display          WALK:      No data to display          Imaging: Personally reviewed and as per EMR and discussion with note No results found.   Lab Results: Personally reviewed CBC    Component Value Date/Time   WBC 9.2 02/17/2023 1128   WBC 6.4 02/03/2023 0325   RBC 3.38 (L) 02/17/2023 1128   RBC 3.16 (L) 02/03/2023 0325   HGB 10.4 (L) 02/17/2023 1128   HCT 32.5 (L) 02/17/2023 1128   PLT 250  02/17/2023 1128   MCV 96 02/17/2023 1128   MCH 30.8 02/17/2023 1128   MCH 30.4 02/03/2023 0325   MCHC 32.0 02/17/2023 1128   MCHC 33.1 02/03/2023 0325   RDW 13.0 02/17/2023 1128   LYMPHSABS 1.4 05/10/2019 1241   MONOABS 0.6 05/10/2019 1241   EOSABS 0.0 05/10/2019 1241   BASOSABS 0.0 05/10/2019 1241    BMET    Component Value Date/Time   NA 141 02/17/2023 1128   K 3.9 02/17/2023 1128   CL 106 02/17/2023 1128   CO2 22 02/17/2023 1128   GLUCOSE 114 (H) 02/17/2023 1128   GLUCOSE 94 02/02/2023 0243   BUN 29 02/17/2023 1128   CREATININE 1.35 (H) 02/17/2023 1128   CALCIUM 9.0 02/17/2023 1128   GFRNONAA 44 (L) 02/02/2023 0243   GFRAA 34 (L) 05/10/2019 1241    BNP No results found for: "BNP"  ProBNP No results found for: "PROBNP"  Specialty Problems       Pulmonary Problems   Sleep apnea    No Known Allergies  Immunization History  Administered Date(s)  Administered   Fluad Trivalent(High Dose 65+) 02/04/2023   PFIZER(Purple Top)SARS-COV-2 Vaccination 07/15/2019, 08/09/2019    Past Medical History:  Diagnosis Date   Blood clot in vein    GERD (gastroesophageal reflux disease)    H/O echocardiogram 09/05/2009   EF >55%   High cholesterol    Hypertension    Sleep apnea     Tobacco History: Social History   Tobacco Use  Smoking Status Never  Smokeless Tobacco Never   Counseling given: Not Answered   Continue to not smoke  Outpatient Encounter Medications as of 07/23/2023  Medication Sig   amLODipine (NORVASC) 2.5 MG tablet Take 1 tablet by mouth daily.   apixaban (ELIQUIS) 5 MG TABS tablet Take 1 tablet (5 mg total) by mouth 2 (two) times daily.   FeFum-FePoly-FA-B Cmp-C-Biot (FOLIVANE-PLUS) CAPS Take 1 capsule by mouth daily.   ipratropium-albuterol (DUONEB) 0.5-2.5 (3) MG/3ML SOLN Take 3 mLs by nebulization 2 (two) times daily.   Multiple Vitamins-Minerals (PRESERVISION AREDS 2+MULTI VIT PO) Take 1 tablet by mouth daily.    olmesartan-hydrochlorothiazide (BENICAR HCT) 20-12.5 MG tablet Take 1 tablet by mouth daily.   OVER THE COUNTER MEDICATION Take 1 tablet by mouth daily. D3 k2   OVER THE COUNTER MEDICATION Take 1 tablet by mouth daily. Omega xl   simvastatin (ZOCOR) 40 MG tablet Take 40 mg by mouth daily at 6 PM.   No facility-administered encounter medications on file as of 07/23/2023.     Review of Systems  Review of Systems  No chest pain, no orthopnea or PND.  Comprehensive review of systems otherwise negative. Physical Exam  BP (!) 180/75 (BP Location: Right Arm, Patient Position: Sitting, Cuff Size: Normal)   Pulse (!) 52   Temp 97.9 F (36.6 C) (Temporal)   Ht 5\' 4"  (1.626 m)   Wt 178 lb (80.7 kg)   SpO2 96%   BMI 30.55 kg/m   Wt Readings from Last 5 Encounters:  07/23/23 178 lb (80.7 kg)  02/17/23 134 lb 6.4 oz (61 kg)  02/04/23 134 lb 7.7 oz (61 kg)  07/08/19 142 lb (64.4 kg)  06/11/19 138 lb (62.6 kg)    BMI Readings from Last 5 Encounters:  07/23/23 30.55 kg/m  02/17/23 23.07 kg/m  02/04/23 23.08 kg/m  07/08/19 24.37 kg/m  06/11/19 23.69 kg/m     Physical Exam General: Elderly, sitting in chair Eyes: EOMI, no icterus Neck: Supple, no JVP Pulmonary: Clear, distant, no work of breathing Cardiovascular: Warm, regular rate and rhythm Abdomen: Nondistended MSK: No synovitis, no joint effusion Neuro: Alert, oriented, interactive Psych: Normal mood, full affect  Assessment & Plan:   Shortness of breath, wheezing: With no cough fever or chills.  No real symptoms of pneumonia.  Reportedly has had chest x-rays but I cannot view these..  Rapidly improved with a course of minutes to hours.  Effectively relieved with DuoNebs.  High suspicion for recurrent aspiration pneumonitis with hiatal hernia.  Scheduled DuoNebs twice a day, continue DuoNebs as needed on top of this.  Chest x-ray today.   Return in about 3 months (around 10/23/2023) for f/u Dr. Judeth Horn.   Karren Burly, MD 08/05/2023   This appointment required 45 minutes of patient care (this includes precharting, chart review, review of results, face-to-face care, etc.).

## 2023-07-23 NOTE — Patient Instructions (Signed)
 It is nice to meet you  Lets repeat a chest x-ray today  Lets do the DuoNebs twice a day scheduled, new prescription today  Okay to continue the DuoNebs as needed as you are, you can ask rhythm in addition if you feel you need them  I do wonder if the hiatal hernia is causing reflux and at night may be things going on the wrong tube causing shadows on the chest x-ray and episodes of wheezing and shortness of breath.  No great fix for this.  Trying to use medicines to treat the symptoms.  Return to clinic in 3 months or sooner as needed with Dr. Judeth Horn

## 2023-11-04 ENCOUNTER — Ambulatory Visit: Admitting: Pulmonary Disease

## 2024-03-08 ENCOUNTER — Encounter: Payer: Self-pay | Admitting: Radiology

## 2024-03-19 ENCOUNTER — Encounter: Payer: Self-pay | Admitting: Pulmonary Disease

## 2024-03-19 ENCOUNTER — Ambulatory Visit (INDEPENDENT_AMBULATORY_CARE_PROVIDER_SITE_OTHER): Admitting: Pulmonary Disease

## 2024-03-19 VITALS — BP 130/52 | HR 98 | Ht 64.0 in | Wt 168.2 lb

## 2024-03-19 DIAGNOSIS — R0609 Other forms of dyspnea: Secondary | ICD-10-CM | POA: Diagnosis not present

## 2024-03-19 DIAGNOSIS — J90 Pleural effusion, not elsewhere classified: Secondary | ICD-10-CM

## 2024-03-19 NOTE — Patient Instructions (Signed)
 It is great to see you again  Glad you are doing well  No changes in medication, continue nebulizer treatments twice a day  Keep an eye on the weight as we discussed  Return to clinic in 6 months or sooner as needed with Dr. Annella

## 2024-03-19 NOTE — Progress Notes (Signed)
 @Patient  ID: [name], female    DOB: [birth year], 89 y.o.   MRN: [id number]  Chief Complaint  Patient presents with   Medical Management of Chronic Issues    Pt states SOB,tiredness, Nebs help with wheezing     Referring provider: Theo Iha, MD  HPI:   89 y.o. woman whom are seen for evaluation of reported recurrent pneumonia felt to be related to aspiration and wheezing and shortness of breath.    Overall doing well.  Breathing stable.  Still short of breath or winded with minimal activity.  But she is minimally active.  Largely wheelchair-bound.  No further courses of pneumonia.  No congestion or worsening respiratory symptoms at starting the breathing treatments, DuoNebs twice daily.  Discussed last chest x-ray, low bit of fluid overload.  Her weight is down almost 10 pound since last visit.  She is taking Lasix as needed based on weight gain, grade 170 pounds.  HPI at initial visit: Patient reports shortness of breath, wheezing.  Reportedly had bouts of recurrent pneumonia over the last couple weeks.  Based on chest x-ray.  Curiously she had no fever or chills.  No cough.  Shortness of breath, sounds like infiltrate on chest x-ray.  Shortness of breath occurs with dressing etc.  She is no longer ambulatory due to significant arthritis.  Usually does not occur at rest.  Usually with some activity such as dressing as discussed above.  She is been prescribed DuoNebs to use as needed.  She uses less frequently than once a day.  Maybe a couple times a week sounds like.  She does feel like it helps when she uses it per her report.  Daughter is not so sure based on her observation of symptoms.  She is a never smoker.  Prior chest x-rays in the past reviewed clear lungs with evidence of hiatal hernia.  Questionaires / Pulmonary Flowsheets:   ACT:      No data to display          MMRC:     No data to display          Epworth:      No data to display           Tests:   FENO:  No results found for: NITRICOXIDE  PFT:     No data to display          WALK:      No data to display          Imaging: Personally reviewed and as per EMR and discussion with note No results found.   Lab Results: Personally reviewed CBC    Component Value Date/Time   WBC 9.2 02/17/2023 1128   WBC 6.4 02/03/2023 0325   RBC 3.38 (L) 02/17/2023 1128   RBC 3.16 (L) 02/03/2023 0325   HGB 10.4 (L) 02/17/2023 1128   HCT 32.5 (L) 02/17/2023 1128   PLT 250 02/17/2023 1128   MCV 96 02/17/2023 1128   MCH 30.8 02/17/2023 1128   MCH 30.4 02/03/2023 0325   MCHC 32.0 02/17/2023 1128   MCHC 33.1 02/03/2023 0325   RDW 13.0 02/17/2023 1128   LYMPHSABS 1.4 05/10/2019 1241   MONOABS 0.6 05/10/2019 1241   EOSABS 0.0 05/10/2019 1241   BASOSABS 0.0 05/10/2019 1241    BMET    Component Value Date/Time   NA 141 02/17/2023 1128   K 3.9 02/17/2023 1128   CL 106 02/17/2023 1128   CO2 22  02/17/2023 1128   GLUCOSE 114 (H) 02/17/2023 1128   GLUCOSE 94 02/02/2023 0243   BUN 29 02/17/2023 1128   CREATININE 1.35 (H) 02/17/2023 1128   CALCIUM 9.0 02/17/2023 1128   GFRNONAA 44 (L) 02/02/2023 0243   GFRAA 34 (L) 05/10/2019 1241    BNP No results found for: BNP  ProBNP No results found for: PROBNP  Specialty Problems       Pulmonary Problems   Sleep apnea    No Known Allergies  Immunization History  Administered Date(s) Administered   Fluad Trivalent(High Dose 65+) 02/04/2023   PFIZER(Purple Top)SARS-COV-2 Vaccination 07/15/2019, 08/09/2019    Past Medical History:  Diagnosis Date   Blood clot in vein    GERD (gastroesophageal reflux disease)    H/O echocardiogram 09/05/2009   EF >55%   High cholesterol    Hypertension    Sleep apnea     Tobacco History: Social History   Tobacco Use  Smoking Status Never  Smokeless Tobacco Never   Counseling given: Not Answered   Continue to not smoke  Outpatient Encounter Medications  as of 03/19/2024  Medication Sig   amLODipine  (NORVASC ) 2.5 MG tablet Take 1 tablet by mouth daily.   apixaban  (ELIQUIS ) 5 MG TABS tablet Take 1 tablet (5 mg total) by mouth 2 (two) times daily.   FeFum-FePoly-FA-B Cmp-C-Biot (FOLIVANE-PLUS) CAPS Take 1 capsule by mouth daily.   ipratropium-albuterol  (DUONEB) 0.5-2.5 (3) MG/3ML SOLN Take 3 mLs by nebulization 2 (two) times daily.   Multiple Vitamins-Minerals (PRESERVISION AREDS 2+MULTI VIT PO) Take 1 tablet by mouth daily.   olmesartan -hydrochlorothiazide  (BENICAR  HCT) 20-12.5 MG tablet Take 1 tablet by mouth daily.   OVER THE COUNTER MEDICATION Take 1 tablet by mouth daily. D3 k2   OVER THE COUNTER MEDICATION Take 1 tablet by mouth daily. Omega xl   predniSONE (DELTASONE) 5 MG tablet Take 5 mg by mouth daily.   simvastatin  (ZOCOR ) 40 MG tablet Take 40 mg by mouth daily at 6 PM.   No facility-administered encounter medications on file as of 03/19/2024.     Review of Systems  Review of Systems  N/a  Physical Exam  BP (!) 130/52   Pulse 98   Ht 5' 4 (1.626 m) Comment: per pt  Wt 168 lb 3.2 oz (76.3 kg) Comment: per pt  SpO2 99%   BMI 28.87 kg/m   Wt Readings from Last 5 Encounters:  03/19/24 168 lb 3.2 oz (76.3 kg)  07/23/23 178 lb (80.7 kg)  02/17/23 134 lb 6.4 oz (61 kg)  02/04/23 134 lb 7.7 oz (61 kg)  07/08/19 142 lb (64.4 kg)    BMI Readings from Last 5 Encounters:  03/19/24 28.87 kg/m  07/23/23 30.55 kg/m  02/17/23 23.07 kg/m  02/04/23 23.08 kg/m  07/08/19 24.37 kg/m     Physical Exam General: Sitting in wheelchair Eyes: No icterus Neck: No JVP appreciated Pulmonary: Clear, symmetric bilaterally Cardiovascular: Warm Abdomen: Nondistended  Assessment & Plan:   Shortness of breath, wheezing: Largely multifactorial, related to deconditioning.  Certainly concern for and small airways disease.  Occasional aspiration pneumonitis.  DuoNebs are helping.  Continue DuoNebs twice daily.  Pleural effusions:  Sign of mild congestive heart failure.  She is taking Lasix as indicated based on weight.  Her weight is down many pounds.,  10 pounds from last visit.  Breathing is improved.  Continue Lasix as she is.   Return in about 6 months (around 09/16/2024) for f/u Dr. Annella.   [name]  Crystin Lechtenberg, MD 03/19/2024
